# Patient Record
Sex: Female | Born: 1960 | Race: White | Hispanic: No | Smoking: Never smoker
Health system: Southern US, Community
[De-identification: ages and names within clinical notes are randomized; demographics above are authoritative.]

## PROBLEM LIST (undated history)

## (undated) DIAGNOSIS — E0590 Thyrotoxicosis, unspecified without thyrotoxic crisis or storm: Secondary | ICD-10-CM

## (undated) DIAGNOSIS — E063 Autoimmune thyroiditis: Secondary | ICD-10-CM

## (undated) DIAGNOSIS — E559 Vitamin D deficiency, unspecified: Secondary | ICD-10-CM

## (undated) DIAGNOSIS — R011 Cardiac murmur, unspecified: Secondary | ICD-10-CM

## (undated) DIAGNOSIS — R32 Unspecified urinary incontinence: Secondary | ICD-10-CM

## (undated) DIAGNOSIS — F32A Depression, unspecified: Secondary | ICD-10-CM

## (undated) DIAGNOSIS — F329 Major depressive disorder, single episode, unspecified: Secondary | ICD-10-CM

## (undated) DIAGNOSIS — G7100 Muscular dystrophy, unspecified: Secondary | ICD-10-CM

## (undated) DIAGNOSIS — G7102 Facioscapulohumeral muscular dystrophy: Secondary | ICD-10-CM

## (undated) DIAGNOSIS — E079 Disorder of thyroid, unspecified: Secondary | ICD-10-CM

## (undated) HISTORY — DX: Disorder of thyroid, unspecified: E07.9

## (undated) HISTORY — DX: Major depressive disorder, single episode, unspecified: F32.9

## (undated) HISTORY — PX: TOTAL KNEE ARTHROPLASTY: SHX125

## (undated) HISTORY — DX: Unspecified urinary incontinence: R32

## (undated) HISTORY — DX: Muscular dystrophy, unspecified: G71.00

## (undated) HISTORY — DX: Depression, unspecified: F32.A

## (undated) HISTORY — DX: Autoimmune thyroiditis: E06.3

## (undated) HISTORY — DX: Cardiac murmur, unspecified: R01.1

---

## 1994-02-17 HISTORY — PX: TUBAL LIGATION: SHX77

## 2006-04-23 ENCOUNTER — Ambulatory Visit: Payer: Self-pay | Admitting: Internal Medicine

## 2006-05-25 ENCOUNTER — Ambulatory Visit: Payer: Self-pay | Admitting: Internal Medicine

## 2007-09-03 ENCOUNTER — Encounter: Payer: Self-pay | Admitting: Family Medicine

## 2007-10-11 ENCOUNTER — Encounter: Payer: Self-pay | Admitting: Family Medicine

## 2007-10-11 ENCOUNTER — Encounter: Payer: Self-pay | Admitting: Obstetrics & Gynecology

## 2007-10-11 ENCOUNTER — Ambulatory Visit: Payer: Self-pay | Admitting: Obstetrics & Gynecology

## 2007-10-19 LAB — CONVERTED CEMR LAB: Pap Smear: NORMAL

## 2007-10-21 ENCOUNTER — Ambulatory Visit: Payer: Self-pay | Admitting: Obstetrics & Gynecology

## 2007-11-18 LAB — HM MAMMOGRAPHY: HM Mammogram: NORMAL

## 2008-01-07 ENCOUNTER — Ambulatory Visit: Payer: Self-pay | Admitting: Family Medicine

## 2008-01-07 DIAGNOSIS — G7109 Other specified muscular dystrophies: Secondary | ICD-10-CM | POA: Insufficient documentation

## 2008-01-07 DIAGNOSIS — E7800 Pure hypercholesterolemia, unspecified: Secondary | ICD-10-CM | POA: Insufficient documentation

## 2008-01-07 DIAGNOSIS — R011 Cardiac murmur, unspecified: Secondary | ICD-10-CM | POA: Insufficient documentation

## 2008-01-07 DIAGNOSIS — R946 Abnormal results of thyroid function studies: Secondary | ICD-10-CM | POA: Insufficient documentation

## 2008-03-08 ENCOUNTER — Ambulatory Visit: Payer: Self-pay | Admitting: Family Medicine

## 2008-03-09 LAB — CONVERTED CEMR LAB
ALT: 17 units/L (ref 0–35)
Albumin: 4 g/dL (ref 3.5–5.2)
BUN: 7 mg/dL (ref 6–23)
Calcium: 9.4 mg/dL (ref 8.4–10.5)
Cholesterol: 215 mg/dL (ref 0–200)
Creatinine, Ser: 0.4 mg/dL (ref 0.4–1.2)
Free T4: 0.6 ng/dL (ref 0.6–1.6)
GFR calc Af Amer: 221 mL/min
Glucose, Bld: 86 mg/dL (ref 70–99)
HDL: 42.4 mg/dL (ref >39.0)
T4, Total: 6.7 ug/dL (ref 5.0–12.5)
Total Protein: 7.3 g/dL (ref 6.0–8.3)
Triglycerides: 78 mg/dL (ref 0–149)

## 2008-10-02 ENCOUNTER — Telehealth: Payer: Self-pay | Admitting: Family Medicine

## 2008-10-02 ENCOUNTER — Encounter: Payer: Self-pay | Admitting: Family Medicine

## 2009-01-18 ENCOUNTER — Telehealth: Payer: Self-pay | Admitting: Family Medicine

## 2009-02-21 ENCOUNTER — Ambulatory Visit: Payer: Self-pay | Admitting: Family Medicine

## 2009-02-21 DIAGNOSIS — L919 Hypertrophic disorder of the skin, unspecified: Secondary | ICD-10-CM

## 2009-02-21 DIAGNOSIS — N39498 Other specified urinary incontinence: Secondary | ICD-10-CM | POA: Insufficient documentation

## 2009-02-21 DIAGNOSIS — L909 Atrophic disorder of skin, unspecified: Secondary | ICD-10-CM | POA: Insufficient documentation

## 2009-02-23 LAB — CONVERTED CEMR LAB
AST: 20 units/L (ref 0–37)
Albumin: 3.9 g/dL (ref 3.5–5.2)
Alkaline Phosphatase: 49 units/L (ref 39–117)
Bilirubin, Direct: 0 mg/dL (ref 0.0–0.3)
CO2: 28 meq/L (ref 19–32)
GFR calc non Af Amer: 181.7 mL/min (ref >60)
Glucose, Bld: 96 mg/dL (ref 70–99)
Potassium: 3.8 meq/L (ref 3.5–5.1)
Sodium: 138 meq/L (ref 135–145)
Total CHOL/HDL Ratio: 4
Total Protein: 7.5 g/dL (ref 6.0–8.3)

## 2009-03-26 ENCOUNTER — Ambulatory Visit: Payer: Self-pay | Admitting: Internal Medicine

## 2009-06-11 ENCOUNTER — Telehealth: Payer: Self-pay | Admitting: Family Medicine

## 2009-08-03 ENCOUNTER — Emergency Department: Payer: Self-pay | Admitting: Emergency Medicine

## 2009-10-03 ENCOUNTER — Ambulatory Visit: Payer: Self-pay | Admitting: Family Medicine

## 2009-10-03 ENCOUNTER — Telehealth: Payer: Self-pay | Admitting: Family Medicine

## 2009-10-03 DIAGNOSIS — J019 Acute sinusitis, unspecified: Secondary | ICD-10-CM | POA: Insufficient documentation

## 2010-01-29 ENCOUNTER — Telehealth: Payer: Self-pay | Admitting: Family Medicine

## 2010-02-01 ENCOUNTER — Ambulatory Visit: Payer: Self-pay | Admitting: Family Medicine

## 2010-02-01 DIAGNOSIS — R5381 Other malaise: Secondary | ICD-10-CM | POA: Insufficient documentation

## 2010-02-01 DIAGNOSIS — R5383 Other fatigue: Secondary | ICD-10-CM

## 2010-02-01 LAB — CONVERTED CEMR LAB
ALT: 17 units/L (ref 0–35)
BUN: 10 mg/dL (ref 6–23)
Basophils Relative: 0.5 % (ref 0.0–3.0)
Bilirubin, Direct: 0.1 mg/dL (ref 0.0–0.3)
Chloride: 106 meq/L (ref 96–112)
Cholesterol: 183 mg/dL (ref 0–200)
Creatinine, Ser: 0.3 mg/dL — ABNORMAL LOW (ref 0.4–1.2)
Eosinophils Absolute: 0.1 10*3/uL (ref 0.0–0.7)
Eosinophils Relative: 2.1 % (ref 0.0–5.0)
GFR calc non Af Amer: 284.83 mL/min (ref >60.00)
HCT: 34.1 % — ABNORMAL LOW (ref 36.0–46.0)
Hemoglobin: 11.5 g/dL — ABNORMAL LOW (ref 12.0–15.0)
LDL Cholesterol: 129 mg/dL — ABNORMAL HIGH (ref 0–99)
Lymphs Abs: 1.2 10*3/uL (ref 0.7–4.0)
MCHC: 33.7 g/dL (ref 30.0–36.0)
MCV: 92.6 fL (ref 78.0–100.0)
Monocytes Absolute: 0.4 10*3/uL (ref 0.1–1.0)
Neutro Abs: 2.7 10*3/uL (ref 1.4–7.7)
Neutrophils Relative %: 61.4 % (ref 43.0–77.0)
RBC: 3.68 M/uL — ABNORMAL LOW (ref 3.87–5.11)
Total Bilirubin: 0.7 mg/dL (ref 0.3–1.2)
Total CHOL/HDL Ratio: 4
Triglycerides: 63 mg/dL (ref 0.0–149.0)
VLDL: 12.6 mg/dL (ref 0.0–40.0)
WBC: 4.4 10*3/uL — ABNORMAL LOW (ref 4.5–10.5)

## 2010-03-05 ENCOUNTER — Ambulatory Visit: Admit: 2010-03-05 | Payer: Self-pay | Admitting: Family Medicine

## 2010-03-19 NOTE — Progress Notes (Signed)
Summary: Rx Venlafaxine  Phone Note Refill Request Call back at [phone] Message from:  [facility] on June 11, 2009 8:37 AM  Refills Requested: Medication #1:  EFFEXOR XR 75 MG XR24H-CAP 1 tab by mouth daily. Received faxed refill request please advise.   Method Requested: Telephone to Pharmacy Initial call taken by: Linde Gillis CMA Duncan Dull),  June 11, 2009 8:38 AM  Follow-up for Phone Call        ok to call in  Call in to the pharmacy of their choice Call in #30, 11 refills. OR if they prefer a 90 day supply, #90 with 3 refills is OK, too Prescription instructions above  Follow-up by: Hannah Beat MD,  June 11, 2009 1:01 PM  Additional Follow-up for Phone Call Additional follow up Details #1::        Rx called to pharmacy Additional Follow-up by: Linde Gillis CMA Duncan Dull),  June 11, 2009 1:06 PM    Prescriptions: EFFEXOR XR 75 MG XR24H-CAP (VENLAFAXINE HCL) 1 tab by mouth daily  #30 x 11   Entered by:   Linde Gillis CMA (AAMA)   Authorized by:   Hannah Beat MD   Signed by:   Linde Gillis CMA (AAMA) on 06/11/2009   Method used:   Historical   RxID:   [id number]  Rx called into pharmacy.  Linde Gillis CMA Duncan Dull)  June 11, 2009 1:06 PM   Appended Document: Rx Venlafaxine Script changed to #90 with 3 refills per pt's request.

## 2010-03-19 NOTE — Progress Notes (Signed)
Summary: ok to take advil  Phone Note Call from Patient   Caller: Patient Summary of Call: Pt states she was seen today.  She says she feels like she has a terrible fever and is asking if she can take advil along with the other meds she is taking for this illness, advised ok. Initial call taken by: Lowella Petties CMA,  October 03, 2009 4:00 PM  Follow-up for Phone Call        ok. Ruthe Mannan MD  October 04, 2009 7:43 AM

## 2010-03-19 NOTE — Assessment & Plan Note (Signed)
Summary: CPX/CLE   Vital Signs:  Patient profile:   50 year old female Height:      65 inches Weight:      202.6 pounds BMI:     33.84 Temp:     98.3 degrees F oral Pulse rate:   78 / minute Pulse rhythm:   regular BP sitting:   120 / 78  (left arm) Cuff size:   regular  Vitals Entered By: Benny Lennert CMA Duncan Dull) (February 21, 2009 11:21 AM)  History of Present Illness: Chief complaint cpx patient needs refill of effexor also patient doesnt want pap  Depression, well controlled on venlafaxine dose. .. Sleeping fairly well, no SI. Fairly good energy.  Stable muscular dystrophy, use wheel chair. Wears pad for intermittant incontinence if waits to long to get to restroom in last 2 years. No stress associated symptoms.  Urinates every 2 hours, sometimes feels not empting completely, good urine flow.  Having menses every three weeks, not heavy..wonders about menopause..sister went throungh in late 72s.     Problems Prior to Update: 1)  Nonspecific Abnorm Results Thyroid Funct Study  (ICD-794.5) 2)  Hypercholesterolemia  (ICD-272.0) 3)  Cardiac Murmur, Benign  (ICD-785.2) 4)  Depression  (ICD-311) 5)  Muscular Dystrophy  (ICD-359.1)  Current Medications (verified): 1)  Probiotic  Caps (Misc Intestinal Flora Regulat) .... Take 1 Capsule By Mouth Once A Day 2)  Effexor Xr 75 Mg Xr24h-Cap (Venlafaxine Hcl) .Marland Kitchen.. 1 Tab By Mouth Daily  Allergies (verified): No Known Drug Allergies  Past History:  Past medical, surgical, family and social histories (including risk factors) reviewed, and no changes noted (except as noted below).  Past Surgical History: Reviewed history from 01/07/2008 and no changes required. BTL 1996  Family History: Reviewed history from 01/07/2008 and no changes required. father: valve in heart replaced, high chol mother: muscular dystrophy, CVA, uterine cancer sister: muscular dystrophy, CHF brother: ?aneurysm  Social History: Reviewed history  from 01/07/2008 and no changes required. Occupation: homemaker, disabled Married 1 son age 56, healthy Never Smoked Alcohol use-no Drug use-no Regular exercise-yes, swimms in summer, stationary bike Diet: fruits, veggies, Weight Watcher's   Review of Systems General:  Complains of fatigue. CV:  Denies chest pain or discomfort. Resp:  Denies shortness of breath. GI:  Denies abdominal pain. GU:  Denies dysuria and hematuria. Derm:  Denies lesion(s).  Physical Exam  General:  Well-developed,well-nourished,in no acute distress; alert,appropriate and cooperative throughout examination in wheelchair Eyes:  No corneal or conjunctival inflammation noted. EOMI. Perrla. Funduscopic exam benign, without hemorrhages, exudates or papilledema. Vision grossly normal. Ears:  External ear exam shows no significant lesions or deformities.  Otoscopic examination reveals clear canals, tympanic membranes are intact bilaterally without bulging, retraction, inflammation or discharge. Hearing is grossly normal bilaterally. Nose:  External nasal examination shows no deformity or inflammation. Nasal mucosa are pink and moist without lesions or exudates. Mouth:  Oral mucosa and oropharynx without lesions or exudates.  Teeth in good repair. Neck:  no carotid bruit or thyromegaly no cervical or supraclavicular lymphadenopathy  Chest Wall:  No deformities, masses, or tenderness noted. Breasts:  No mass, nodules, thickening, tenderness, bulging, retraction, inflamation, nipple discharge or skin changes noted.    Lungs:  Normal respiratory effort, chest expands symmetrically. Lungs are clear to auscultation, no crackles or wheezes. Heart:  Normal rate and regular rhythm. S1 and S2 normal without gallop, murmur, click, rub or other extra sounds. Abdomen:  Bowel sounds positive,abdomen soft and non-tender without masses,  organomegaly or hernias noted. Msk:  diffuse weakness secondary to MD Pulses:  R and L posterior  tibial pulses are full and equal bilaterally  Extremities:  no edema Skin:  Skin tags in axillae and under breasts Wart on right lower abdomen.   Impression & Recommendations:  Problem # 1:  Preventive Health Care (ICD-V70.0) Reviewed preventive care protocols, scheduled due services, and updated immunizations. Encouraged exercise, weight loss, healthy eating habits.   Problem # 2:  DEPRESSION (ICD-311)  Her updated medication list for this problem includes:    Effexor Xr 75 Mg Xr24h-cap (Venlafaxine hcl) .Marland Kitchen... 1 tab by mouth daily  Orders: TLB-TSH (Thyroid Stimulating Hormone) (84443-TSH)  Problem # 3:  OVERFLOW INCONTINENCE (ICD-788.39) Given MD..recommend urodynamics to determine how bladder functioning...no c;early simply a bladder spasm urgency issue.  Orders: Urology Referral (Urology)  Problem # 4:  ACROCHORDON (ICD-701.9)  Orders: Cryotherapy/Destruction benign or premalignant lesion (1st lesion)  (17000) Cryotherapy/Destruction benign or premalignant lesion (2nd-14th lesions) (17003)  Complete Medication List: 1)  Probiotic Caps (Misc intestinal flora regulat) .... Take 1 capsule by mouth once a day 2)  Effexor Xr 75 Mg Xr24h-cap (Venlafaxine hcl) .Marland Kitchen.. 1 tab by mouth daily  Other Orders: TLB-Lipid Panel (80061-LIPID) TLB-BMP (Basic Metabolic Panel-BMET) (80048-METABOL) TLB-Hepatic/Liver Function Pnl (80076-HEPATIC)  Patient Instructions: 1)  Referral Appointment Information 2)  Day/Date: 3)  Time: 4)  Place/MD: 5)  Address: 6)  Phone/Fax: 7)  Patient given appointment information. Information/Orders faxed/mailed.  8)  Please schedule a follow-up appointment in 1 year.  9)  You need to lose weight. Consider a lower calorie diet and regular exercise as tolerated.  10)  Take calcium +vitamin D daily 600 mg/400IU  Prescriptions: EFFEXOR XR 75 MG XR24H-CAP (VENLAFAXINE HCL) 1 tab by mouth daily  #90 x 3   Entered and Authorized by:   Kerby Nora MD   Signed  by:   Kerby Nora MD on 02/21/2009   Method used:   Electronically to        [facility] (retail)       [address]       [place], [place]  [address]       Ph: [phone]       Fax: [phone]   RxID:   [id number]   Current Allergies (reviewed today): No known allergies   Flu Vaccine Next Due:  Refused Last PAP:  Normal (10/19/2007 2:37:02 PM) PAP Next Due:  2 yr Last Mammogram:  Normal Bilateral (11/18/2007 2:37:02 PM) Mammogram Next Due:  Refused

## 2010-03-19 NOTE — Assessment & Plan Note (Signed)
Summary: BOTH HEAD AND CHEST COLD, COUGH, POSSIBLE FLU, BODY ACHING/JRR   Vital Signs:  Patient profile:   50 year old female Height:      65 inches Temp:     99.6 degrees F oral Pulse rate:   68 / minute Pulse rhythm:   regular BP sitting:   130 / 76  (left arm) Cuff size:   regular  Vitals Entered By: Linde Gillis CMA Duncan Dull) (October 03, 2009 10:33 AM) CC: head/chest cold, body aches, ? flu Comments Patient did not weigh she was on a mobility scooter   History of Present Illness: 50 yo here for URI symptoms for over a week. Has a h/o Muscular dystrophy and concerned that because she is immobile, she could develop pneumonia.  Fever for past 3 days, unsure of how high. Had 800 mg of Ibuprofen an hour before she came to office and temp is 99.6. Body aches, chills, facial pressure, runny nose. Dry cough, can't stop coughing.  No wheezing, SOB or CP.  Current Medications (verified): 1)  Probiotic  Caps (Misc Intestinal Flora Regulat) .... Take 1 Capsule By Mouth Once A Day 2)  Effexor Xr 75 Mg Xr24h-Cap (Venlafaxine Hcl) .Marland Kitchen.. 1 Tab By Mouth Daily 3)  Augmentin 500-125 Mg Tabs (Amoxicillin-Pot Clavulanate) .Marland Kitchen.. 1 By Mouth 2 Times Daily X 10 Days 4)  Tessalon Perles 100 Mg  Caps (Benzonatate) .Marland Kitchen.. 1 Tab By Mouth Three Times A Day As Needed Cough  Allergies (verified): No Known Drug Allergies  Past History:  Past Surgical History: Last updated: 01/07/2008 BTL 1996  Family History: Last updated: 01/07/2008 father: valve in heart replaced, high chol mother: muscular dystrophy, CVA, uterine cancer sister: muscular dystrophy, CHF brother: ?aneurysm  Social History: Last updated: 01/07/2008 Occupation: homemaker, disabled Married 1 son age 48, healthy Never Smoked Alcohol use-no Drug use-no Regular exercise-yes, swimms in summer, stationary bike Diet: fruits, veggies, Weight Watcher's   Risk Factors: Exercise: yes (01/07/2008)  Risk Factors: Smoking Status: never  (01/07/2008)  Review of Systems      See HPI General:  Complains of chills and fever. ENT:  Complains of earache, nasal congestion, postnasal drainage, sinus pressure, and sore throat; denies ringing in ears. CV:  Denies chest pain or discomfort and difficulty breathing at night. Resp:  Complains of cough; denies shortness of breath, sputum productive, and wheezing.  Physical Exam  General:  Well-developed,well-nourished,in no acute distress; alert,appropriate and cooperative throughout examination in wheelchair Ears:  TM retracted bilaterally, clear fluid. Nose:  nasal dischargemucosal pallor.   Frontal sinuses TTP Mouth:  pharyngeal erythema.   Lungs:  Normal respiratory effort, chest expands symmetrically. Lungs are clear to auscultation, no crackles or wheezes. Heart:  Normal rate and regular rhythm. S1 and S2 normal without gallop, murmur, click, rub or other extra sounds. Psych:  Cognition and judgment appear intact. Alert and cooperative with normal attention span and concentration. No apparent delusions, illusions, hallucinations   Impression & Recommendations:  Problem # 1:  ACUTE SINUSITIS, UNSPECIFIED (ICD-461.9) Assessment New Given duration and progression of symptoms and her immobility, will treat for bacterial sinusitis with Augmentin. See pt instructions for details. Her updated medication list for this problem includes:    Augmentin 500-125 Mg Tabs (Amoxicillin-pot clavulanate) .Marland Kitchen... 1 by mouth 2 times daily x 10 days    Tessalon Perles 100 Mg Caps (Benzonatate) .Marland Kitchen... 1 tab by mouth three times a day as needed cough  Complete Medication List: 1)  Probiotic Caps (Misc intestinal flora regulat) .Marland KitchenMarland KitchenMarland Kitchen  Take 1 capsule by mouth once a day 2)  Effexor Xr 75 Mg Xr24h-cap (Venlafaxine hcl) .Marland Kitchen.. 1 tab by mouth daily 3)  Augmentin 500-125 Mg Tabs (Amoxicillin-pot clavulanate) .Marland Kitchen.. 1 by mouth 2 times daily x 10 days 4)  Tessalon Perles 100 Mg Caps (Benzonatate) .Marland Kitchen.. 1 tab by  mouth three times a day as needed cough  Patient Instructions: 1)  Take antibiotic as directed.  Drink lots of fluids.  Treat sympotmatically with Mucinex, nasal saline irrigation, and Tylenol/Ibuprofen. Also try claritin D or zyrtec D over the counter- two times a day as needed ( have to sign for them at pharmacy). You can use warm compresses.  Cough suppressant at night. Call if not improving as expected in 5-7 days.  Prescriptions: TESSALON PERLES 100 MG  CAPS (BENZONATATE) 1 tab by mouth three times a day as needed cough  #30 x 0   Entered and Authorized by:   Ruthe Mannan MD   Signed by:   Ruthe Mannan MD on 10/03/2009   Method used:   Electronically to        [facility] (retail)       [address] [place], [place]  [address]       Ph: [phone]       Fax: [phone]   RxID:   [id number] AUGMENTIN 500-125 MG TABS (AMOXICILLIN-POT CLAVULANATE) 1 by mouth 2 times daily x 10 days  #20 x 0   Entered and Authorized by:   Ruthe Mannan MD   Signed by:   Ruthe Mannan MD on 10/03/2009   Method used:   Electronically to        [facility] (retail)       [address] [place], [place]  [address]       Ph: [phone]       Fax: [phone]   RxID:   [id number]   Current Allergies (reviewed today): No known allergies

## 2010-03-21 NOTE — Progress Notes (Signed)
Summary: wants labs  Phone Note Call from Patient Call back at Home Phone [phone]   Caller: Patient Call For: Kerby Nora MD Summary of Call: Pt is asking to have lab work done.  She wants her thyroid checked, lipids, hormone levels checked. Initial call taken by: Lowella Petties CMA, AAMA,  January 29, 2010 12:50 PM  Follow-up for Phone Call        Fasting lipids, CMET Dx 272.0, TSH, cbc Dx 780.79 WE do not regularly check hormone levels unless pt has specific concern.. etc. let me know if she is.  Schedule CPX in 02/2010 Follow-up by: Kerby Nora MD,  January 29, 2010 1:48 PM  Additional Follow-up for Phone Call Additional follow up Details #1::        left message for patient to  return my call.Consuello Masse CMA      Additional Follow-up for Phone Call Additional follow up Details #2::    Patient wants labs done before beginning of year so after she has these done she will schedule the cpx.Consuello Masse CMA   Follow-up by: Benny Lennert CMA Duncan Dull),  January 30, 2010 9:41 AM

## 2010-04-20 ENCOUNTER — Encounter: Payer: Self-pay | Admitting: Family Medicine

## 2010-04-26 ENCOUNTER — Ambulatory Visit: Payer: Self-pay | Admitting: Internal Medicine

## 2010-05-04 ENCOUNTER — Emergency Department: Payer: Self-pay | Admitting: Emergency Medicine

## 2010-05-06 ENCOUNTER — Other Ambulatory Visit: Payer: Self-pay | Admitting: Family Medicine

## 2010-05-06 ENCOUNTER — Ambulatory Visit (INDEPENDENT_AMBULATORY_CARE_PROVIDER_SITE_OTHER): Payer: BC Managed Care – PPO | Admitting: Family Medicine

## 2010-05-06 ENCOUNTER — Encounter: Payer: Self-pay | Admitting: Family Medicine

## 2010-05-06 DIAGNOSIS — B9789 Other viral agents as the cause of diseases classified elsewhere: Secondary | ICD-10-CM

## 2010-05-06 DIAGNOSIS — K920 Hematemesis: Secondary | ICD-10-CM

## 2010-05-06 DIAGNOSIS — R112 Nausea with vomiting, unspecified: Secondary | ICD-10-CM

## 2010-05-06 LAB — CBC WITH DIFFERENTIAL/PLATELET
Basophils Absolute: 0 10*3/uL (ref 0.0–0.1)
Basophils Relative: 0.4 % (ref 0.0–3.0)
HCT: 40 % (ref 36.0–46.0)
Hemoglobin: 13.6 g/dL (ref 12.0–15.0)
Lymphs Abs: 1.1 10*3/uL (ref 0.7–4.0)
Monocytes Relative: 8.3 % (ref 3.0–12.0)
Neutro Abs: 6.2 10*3/uL (ref 1.4–7.7)
RDW: 14.4 % (ref 11.5–14.6)

## 2010-05-06 LAB — BASIC METABOLIC PANEL
CO2: 25 mEq/L (ref 19–32)
GFR: 210.89 mL/min (ref >60.00)
Glucose, Bld: 104 mg/dL — ABNORMAL HIGH (ref 70–99)
Potassium: 3.8 mEq/L (ref 3.5–5.1)
Sodium: 137 mEq/L (ref 135–145)

## 2010-05-08 ENCOUNTER — Encounter: Payer: Self-pay | Admitting: Family Medicine

## 2010-05-08 ENCOUNTER — Ambulatory Visit (INDEPENDENT_AMBULATORY_CARE_PROVIDER_SITE_OTHER): Payer: BC Managed Care – PPO | Admitting: Family Medicine

## 2010-05-08 VITALS — BP 126/82 | HR 108 | Temp 97.5°F | Ht 65.0 in

## 2010-05-08 DIAGNOSIS — K529 Noninfective gastroenteritis and colitis, unspecified: Secondary | ICD-10-CM

## 2010-05-08 DIAGNOSIS — K5289 Other specified noninfective gastroenteritis and colitis: Secondary | ICD-10-CM

## 2010-05-08 NOTE — Progress Notes (Signed)
Gastroenteritis: Patient complains of nonbilious vomiting 2 times per day for 2 days.  There is no report of blood in stool, constipation, fever, heartburn, hematemesis and melena. Patient's oral intake has been decreased for liquids and decreased for solids.  Patient's urine output has been decreased with 3 voids in 24 hours, the last void was several hours ago.  Other contacts with similar symptoms include none.  Patient denies recent travel history. Patient denies recent ingestion of possible contaminated food, toxic plants, inappropriate medications/poisons.   ROS above  GEN: WDWN, NAD, Non-toxic, A & O x 3, in wheelchair HEENT: Atraumatic, Normocephalic. Neck supple. No masses, No LAD. Ears and Nose: No external deformity. CV: RRR, No M/G/R. No JVD. No thrill. No extra heart sounds. PULM: CTA B, no wheezes, crackles, rhonchi. No retractions. No resp. distress. No accessory muscle use. ABD: S, NT, ND,  Hyperactive BS. No rebound tenderness. No HSM.  EXTR: No c/c/e NEURO Normal gait.  PSYCH: Normally interactive. Conversant. Not depressed or anxious appearing.  Calm demeanor.   A/P: gastroenteritis, improving, c/w supportive care

## 2010-05-08 NOTE — Progress Notes (Deleted)
  Subjective:    Patient ID: [name], female    DOB: [birth year], 50 y.o.   MRN: [id number]  HPI    Review of Systems     Objective:   Physical Exam        Assessment & Plan:

## 2010-05-08 NOTE — Patient Instructions (Signed)
NAUSEA AND VOMITTING  Usually caused by GI infection  -persistant nausea and vomiting can be from other disorders and should be evaluated.  1. Avoid solid foods and dairy   2. Prevent dehydration: Pedialyte, Gatorade, Ginger Ale, broth, water. Start with small sips and slowly increase.  3. When able, advance diet to bananas, rice, applesauce, grits, toast, oatmeal. Avoid dairy, spicy, or fried foods.   4. Persistant vomiting in infants more than 6 hours always need a medical evaluation.

## 2010-05-16 NOTE — Assessment & Plan Note (Signed)
Summary: acu visit per lori jrt   Vital Signs:  Patient profile:   50 year old female Height:      65 inches Temp:     97.8 degrees F oral Pulse rate:   74 / minute Pulse rhythm:   regular BP sitting:   110 / 80  (right arm) Cuff size:   regular  Vitals Entered By: Linde Gillis CMA Duncan Dull) (May 06, 2010 12:16 PM) CC: not feeling well   History of Present Illness: 50 year old with MD presents "not feeling well"  04/18/2010 - started out with some sinus difficulties and went to the UC in [place] and took some advil and mucinex, also took a decongestant. Then went to UC, then took some amox.  Also had some ear congestion.   Then a week later, went to ENT, ABX, nose spray and mucinex. Flonase, also started to throw up since sat.   Still feeling nauseous.  Not been able to drink or eat much.  Threw up in the parking lot.   Threw up some distincet reddish coloration. Was a little, then threw up a second time and it was more. No coffee ground emesis. Threw up three times in the last 24 hours.  urinated this morning at 11 AM and last PM at 9 PM.  Current Problems (verified): 1)  Other Malaise and Fatigue  (ICD-780.79) 2)  Acute Sinusitis, Unspecified  (ICD-461.9) 3)  Routine General Medical Exam@[facility]  (ICD-V70.0) 4)  Acrochordon  (ICD-701.9) 5)  Overflow Incontinence  (ICD-788.39) 6)  Nonspecific Abnorm Results Thyroid Funct Study  (ICD-794.5) 7)  Hypercholesterolemia  (ICD-272.0) 8)  Cardiac Murmur, Benign  (ICD-785.2) 9)  Depression  (ICD-311) 10)  Muscular Dystrophy  (ICD-359.1)  Allergies (verified): No Known Drug Allergies  Past History:  Past medical, surgical, family and social histories (including risk factors) reviewed, and no changes noted (except as noted below).  Past Surgical History: Reviewed history from 01/07/2008 and no changes required. BTL 1996  Family History: Reviewed history from 01/07/2008 and no changes required. father: valve in  heart replaced, high chol mother: muscular dystrophy, CVA, uterine cancer sister: muscular dystrophy, CHF brother: ?aneurysm  Social History: Reviewed history from 01/07/2008 and no changes required. 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 Psych:  tearful, has not taken antidepressants in 3 days.  Physical Exam  General:  Well-developed,well-nourished,in no acute distress; alert,appropriate and cooperative throughout examination in wheelchair Head:  normocephalic and atraumatic.   Ears:  serous TM B, no external deformities.   Mouth:  Oral mucosa and oropharynx without lesions or exudates.  Teeth in good repair. Neck:  No deformities, masses, or tenderness noted. Lungs:  Normal respiratory effort, chest expands symmetrically. Lungs are clear to auscultation, no crackles or wheezes. Heart:  Normal rate and regular rhythm. S1 and S2 normal without gallop, murmur, click, rub or other extra sounds. Abdomen:  soft, non-tender, normal bowel sounds, no distention, no masses, no guarding, and no rigidity.   Cervical Nodes:  No lymphadenopathy noted Psych:  tearful   Impression & Recommendations:  Problem # 1:  NAUSEA WITH VOMITING (ICD-787.01) Assessment New mild dehydration appears stable for outpatient management  if cbc comes back low, may need admission for ASAP EGD.  zofran as needed and  advance fluids  recheck in 2 days  Orders: Venipuncture (84696) TLB-BMP (Basic Metabolic Panel-BMET) (80048-METABOL)  Problem # 2:  HEMATEMESIS (ICD-578.0) Assessment: New  Orders: TLB-CBC Platelet - w/Differential (85025-CBCD)  Problem # 3:  VIRAL INFECTION-UNSPEC (ICD-079.99) viral infection vs sinusitis appears to be improving, d/c LVQ. I am not sure if  helping at this point. sinuses NT, no OM  The following medications were removed from the medication list:    Tessalon Perles 100 Mg Caps (Benzonatate) .Marland Kitchen... 1 tab by mouth three times a day as needed cough  Complete Medication List: 1)  Probiotic Caps (Misc intestinal flora regulat) .... Take 1 capsule by mouth once a day 2)  Effexor Xr 75 Mg Xr24h-cap (Venlafaxine hcl) .Marland Kitchen.. 1 tab by mouth daily 3)  Zofran Odt 4 Mg Tbdp (Ondansetron) .Marland Kitchen.. 1 by mouth q 8 hours as needed nausea  Patient Instructions: 1)  recheck on Wed. with Dr. Patsy Lager Prescriptions: ZOFRAN ODT 4 MG TBDP (ONDANSETRON) 1 by mouth q 8 hours as needed nausea  #30 x 0   Entered and Authorized by:   Hannah Beat MD   Signed by:   Hannah Beat MD on 05/06/2010   Method used:   [facility]        [facility]  [address] [phone][facility]       [address]       [place], [place]  [address]       Ph: [phone] or [phone]       Fax: [phone]   RxID:   [id number]    Orders Added: 1)  Venipuncture [16010] 2)  TLB-BMP (Basic Metabolic Panel-BMET) [80048-METABOL] 3)  TLB-CBC Platelet - w/Differential [85025-CBCD] 4)  Est. Patient Level IV [93235]    Current Allergies (reviewed today): No known allergies

## 2010-06-04 ENCOUNTER — Encounter: Payer: Self-pay | Admitting: Family Medicine

## 2010-06-04 ENCOUNTER — Other Ambulatory Visit (HOSPITAL_COMMUNITY)
Admission: RE | Admit: 2010-06-04 | Discharge: 2010-06-04 | Disposition: A | Discharge status: Home / Self Care | Payer: BC Managed Care – PPO | Source: Ambulatory Visit | Attending: Family Medicine | Admitting: Family Medicine

## 2010-06-04 ENCOUNTER — Ambulatory Visit (INDEPENDENT_AMBULATORY_CARE_PROVIDER_SITE_OTHER): Payer: BC Managed Care – PPO | Admitting: Family Medicine

## 2010-06-04 VITALS — BP 110/80 | HR 89 | Temp 98.6°F | Ht 65.0 in | Wt 188.4 lb

## 2010-06-04 DIAGNOSIS — Z01419 Encounter for gynecological examination (general) (routine) without abnormal findings: Secondary | ICD-10-CM

## 2010-06-04 DIAGNOSIS — Z1159 Encounter for screening for other viral diseases: Secondary | ICD-10-CM | POA: Insufficient documentation

## 2010-06-04 DIAGNOSIS — F3289 Other specified depressive episodes: Secondary | ICD-10-CM

## 2010-06-04 DIAGNOSIS — F329 Major depressive disorder, single episode, unspecified: Secondary | ICD-10-CM

## 2010-06-04 DIAGNOSIS — E7800 Pure hypercholesterolemia, unspecified: Secondary | ICD-10-CM

## 2010-06-04 DIAGNOSIS — G7109 Other specified muscular dystrophies: Secondary | ICD-10-CM

## 2010-06-04 MED ORDER — VENLAFAXINE HCL ER 75 MG PO CP24: 75.0000 mg | ORAL_CAPSULE | Freq: Every day | ORAL | Status: DC

## 2010-06-04 NOTE — Patient Instructions (Signed)
Call when you are ready for mammogram in fall. Continue great work on weight loss and diet changes.

## 2010-06-04 NOTE — Progress Notes (Signed)
Subjective:    Patient ID: [name], female    DOB: 1960/03/06, 50 y.o.   MRN: [id number]  HPI 50 year old female with history of muscular dystrophy presents for yearly CPX.  Depression, well controlled on venlafaxine dose. .. Sleeping fairly well, no SI.  Fairly good energy.   Stable muscular dystrophy, use wheel chair. Wears pad for intermittant incontinence if waits to long to get to restroom in last 2 years. No stress associated symptoms.   Urinates every 2 hours, sometimes feels not empting completely, good urine flow.  High cholesterol:Much improved with lifestyle changes.  Depression.Jamal Maes trial off for 10 days but found she needs it.. So restarted. Doing well back on this. Sleeping well at night. No SI.  Exercising 3 times a week. Also on weight watcher's ... Has lost 20 lbs.   Review of Systems  Constitutional: Positive for fatigue. Negative for fever.  HENT: Negative for congestion, rhinorrhea, sneezing, neck pain and postnasal drip.   Respiratory: Negative for chest tightness and shortness of breath.   Cardiovascular: Negative for chest pain, palpitations and leg swelling.  Gastrointestinal: Negative for nausea, abdominal pain, diarrhea, constipation and blood in stool.  Genitourinary: Negative for dysuria, urgency, flank pain, decreased urine volume and menstrual problem.       [Chronic urinary retention.. Never saw urologist.      Objective:   Physical Exam  Constitutional: Vital signs are normal. She appears well-developed and well-nourished. She is cooperative.  Non-toxic appearance. She does not appear ill. No distress.       Contractures and muscular dystrophy changes... Able to lift herself to table but uses scooter.  HENT:  Head: Normocephalic.  Right Ear: Hearing, tympanic membrane, external ear and ear canal normal.  Left Ear: Hearing, tympanic membrane, external ear and ear canal normal.  Nose: Nose normal.  Eyes: Conjunctivae, EOM and lids are  normal. Pupils are equal, round, and reactive to light. No foreign bodies found.  Neck: Trachea normal and normal range of motion. Neck supple. Carotid bruit is not present. No mass and no thyromegaly present.  Cardiovascular: Normal rate, regular rhythm, S1 normal, S2 normal, normal heart sounds and intact distal pulses.  Exam reveals no gallop.   No murmur heard. Pulmonary/Chest: Effort normal and breath sounds normal. No respiratory distress. She has no wheezes. She has no rhonchi. She has no rales.  Abdominal: Soft. Normal appearance and bowel sounds are normal. She exhibits no distension, no fluid wave, no abdominal bruit and no mass. There is no hepatosplenomegaly. There is no tenderness. There is no rebound, no guarding and no CVA tenderness. No hernia.  Genitourinary: Vagina normal and uterus normal. No breast swelling, tenderness, discharge or bleeding. Pelvic exam was performed with patient prone. There is no rash, tenderness or lesion on the right labia. There is no rash, tenderness or lesion on the left labia. Uterus is not enlarged and not tender. Cervix exhibits motion tenderness. Cervix exhibits no discharge and no friability. Right adnexum displays no mass, no tenderness and no fullness. Left adnexum displays no mass, no tenderness and no fullness.  Lymphadenopathy:    She has no cervical adenopathy.    She has no axillary adenopathy.  Neurological: She is alert. She has normal strength. No cranial nerve deficit or sensory deficit.  Skin: Skin is warm, dry and intact. No rash noted.  Psychiatric: She has a normal mood and affect. Her speech is normal and behavior is normal. Judgment normal. Her mood appears not  anxious. Cognition and memory are normal. She does not exhibit a depressed mood.          Assessment & Plan:  Complete Physical Exam:  The patient's preventative maintenance and recommended screening tests for an annual wellness exam were reviewed in full today. Brought  up to date unless services declined.  Counselled on the importance of diet, exercise, and its role in overall health and mortality. The patient's FH and SH was reviewed, including their home life, tobacco status, and drug and alcohol status.    High cholesterol, well controlled with lifestyle. Continue good work.  Muscular Dystrophy: Stable.  Depression, well controlled on effexor. Refilled.

## 2010-06-11 ENCOUNTER — Encounter: Payer: Self-pay | Admitting: *Deleted

## 2010-07-02 NOTE — Assessment & Plan Note (Signed)
Katrina, [name]               ACCOUNT NO.:  [id number]   MEDICAL RECORD NO.:  [id number]          PATIENT TYPE:  POB   LOCATION:  [facility]         FACILITY:  [facility] [place]   PHYSICIAN:  Johnella Moloney, MD        DATE OF BIRTH:  [birth year]   DATE OF SERVICE:  10/11/2007                                  CLINIC NOTE   CHIEF COMPLAINT:  Annual examination.   HISTORY OF PRESENT ILLNESS:  The patient is a 50 year old gravida 1,  para 1, last menstrual period on October 02, 2007, who is here for her  physical examination and to initiate gynecologic care.  The patient also  reports two issues.  1. Irregular menstrual bleeding.  The patient reports menarche at age      2.  Her period used to come every 4 weeks, and from 2 years ago,      it started coming every 3 weeks.  Then in May 2009, she noticed      that the cycle length got even shorter, and on examination of her      menstrual calendar, the patient was noted to have a menstrual      period from Jun 30, 2007, to Jul 07, 2007, and then July 27, 2007,      to August 01, 2007; August 12, 2007, to August 21, 2007; September 06, 2007, to      September 14, 2007; and October 02, 2007, to October 09, 2007.  The      patient is extremely anxious about this irregular bleeding.  She      also saying that her bleeding is very heavy using 5 super tampons a      day, as she notices that she passes a lot of clots.  She feels      extremely fatigued and also very weak.  The patient does deny any      lightheadedness, dizziness, or other presyncopal symptoms.  The      patient's mother has a history of endometrial cancer, and the      patient is very worried about this being a possibility.  2. Urinary incontinence.  Of note, the patient does have a history of      muscular dystrophy.  She has the facioscapulohumeral muscular      dystrophy and is followed by a specialist for this.  The patient      has noted increasing urinary incontinence over the past year.   She      describes feeling a lot of urge to go to the bathroom, but given      her muscular dystrophy and fatigue due to the irregular bleeding,      that she sometime is not able to make it to the bathroom and have      episode of incontinence requiring wearing pads daily.  The patient      wants to be evaluated for this and she went to her specialist and      asked if this had anything to do with her type of dystrophy, and      she was told that it was not  usual for the patients with this      muscular dystrophy problem to have urinary incontinence.   The patient does not have any other gynecologic concerns.  She is  currently sexually active with her husband and reports no problems.   PAST OBSTETRICAL HISTORY:  The patient had 1 cesarean section 19 years  ago, 1 living child.  Menstrual history as detailed in the HPI.  The  patient had a bilateral tubal ligation for contraception.  She has never  had an abnormal Pap smear and her last Pap smear was over 2 years ago.  Her last mammogram was 1-1/2 years ago and this was abnormal.  She  reports that they found a cyst but cannot remember the type of cyst that  was found.   PAST MEDICAL HISTORY:  1. Facioscapulohumeral muscular dystrophy.  2. Arthritis.  3. Hypercholesterinemia.   PAST SURGICAL HISTORY:  1. Cesarean section.  2. Bilateral tubal ligation.   MEDICATIONS:  Fluoxetine 40 mg 1 tablet p.o. at bedtime.   ALLERGIES:  No known drug allergies.  The patient is not allergic to  latex.   SOCIAL HISTORY:  The patient lives with her husband and her child.  She  is unemployed.  She does not smoke, drink alcohol, or use any illicit  drugs.  The patient denies any past or current history of physical,  sexual, or emotional abuse.   Familial history is notable for the [facility] muscular dystrophy in the  patient, her sister, and her mother.  Her mother also has a history of  endometrial cancer.  Sister has a history of blood clots, and  the father  has a history of heart disease.   REVIEW OF SYSTEMS:  The patient endorses numbness and weakness in  fingers, occasional night sweats, fatigue, and weight loss (The patient  is on weight watchers and trying to lose weight).  The patient also  mentions that she is coughing up phlegm.  Occasionally, she has loss of  urine with coughing and sneezing and has hot flashes some nights.   PHYSICAL EXAMINATION:  VITAL SIGNS:  Blood pressure 121/85, pulse 87,  respirations 20, weight 194 pounds, and height 5 feet and 5 inches.  GENERAL:  No apparent distress.  The patient is noted to ambulate with  the use of a walker.  HEAD, EARS, EYES, NOSE, AND THROAT:  Normocephalic and atraumatic.  NECK:  Supple.  No masses palpated.  THYROID:  Normal thyroid.  BREASTS:  Symmetric in size and no abnormal masses, discharge, skin  changes, or lymphadenopathy noted.  LUNGS:  Clear to auscultation bilaterally.  HEART:  Regular rate and rhythm.  ABDOMEN:  Soft, nontender, and nondistended.  Well-healed Pfannenstiel  incision from cesarean section.  PELVIC:  Normal external female genitalia.  Pink and well-rugated  vagina.  No lesions seen.  Normal discharge noted.  Normal cervical  contour.  Pap smear sample obtained.  On bimanual exam, the patient is  noted to have a small, mobile, and anteverted uterus normal contour.  Normal adnexa.  No tenderness on palpation.   ENDOMETRIAL BIOPSY:  The patient was told about the need for an  endometrial biopsy given her irregular bleeding.  The risks of the  biopsy including bleeding, infection, injury to surrounding organs, need  for additional procedures, possibility of not detecting abnormal tissue  were discussed with the patient and informed consent was obtained.  Of  note, the patient had a bilateral tubal ligation.  A urine pregnancy  was  not performed prior to this procedure.  After the Pap smear was  obtained, the cervix was swabbed with Betadine.   A tenaculum was placed  in the anterior lip of the cervix.  The 3-mm Pipelle was then introduced  into the uterine cavity and was noted to sound to 9 cm.  The suction was  then created, and the Pipelle was slowly rotated to obtain a moderate  amount of endometrial tissue.  This Pipelle was then removed from the  patient's uterus and tissue was sent to pathology for analysis.  The  tenaculum was removed.  Small amount of bleeding was noted from the  tenaculum site, which was controlled using pressure.  Overall, good  hemostasis was noted and all instruments were removed from the patient's  vagina.  The patient was told to expect some bleeding and cramping and  was given ibuprofen at the end of the procedure to help with her  cramping.   ASSESSMENT AND PLAN:  The patient is a 50 year old gravida 1, para 1  here for her annual exam.  The patient also reports 2 issues.  After her  physical exam, Pap smear was done.  We will follow up results.  Her  mammogram will be scheduled at the end of this visit.  The patient will  follow up with her primary care doctor for colon cancer screening as  indicated.  As for the patient's menometrorrhagia on review of her  menstrual calendar and apart from the menstrual period that happened  between August 12, 2007, and August 21, 2007, the rest of her cycles are  falling within 25 days of a cycle length.  She was told that this was  considered normal; however, given her report of menorrhagia, she did  need evaluation in the form of an endometrial biopsy.  We will follow up  results of this.  The patient will also undergo a pelvic ultrasound to  rule out any structural anomaly.  We will also check labs in form of  CBC, TSH, prolactin, FSH, and estradiol just to rule out other  etiologies of menorrhagia.  We will follow up with these results when  the patient comes back in 2 weeks.  As for her urinary incontinence, we  will check a urinalysis and urine culture to  rule out a urinary tract  infection.  The patient was told that there are cases reported in the  literature of urinary incontinence occurring in the patients who do have  muscular dystrophy.  The etiology of the urinary incontinence is thought  to be an upper motor neuron problem rather than a dystrophy of the  bladder muscles, but a lot of research has not been done in this area.  The patient is still at risk for developing stress incontinence as a  result of her pregnancy or urge incontinence given her symptoms.  She  will be referred to Urology for urodynamic testing, and based on the  results of this testing, that we will determine what kind of treatment  will be needed.  We will follow up the results of this testing and act  accordingly.  The patient was commended on her weight loss efforts and  told to continue her weight loss program.  She will follow up in 2 weeks  for discussion of results and was told to call the GYN Clinic or come to  the emergency room for any other GYN issues.  ______________________________  Johnella Moloney, MD     UD/MEDQ  D:  10/11/2007  T:  10/12/2007  Job:  [id number]

## 2010-11-14 ENCOUNTER — Telehealth: Payer: Self-pay

## 2010-11-14 DIAGNOSIS — R61 Generalized hyperhidrosis: Secondary | ICD-10-CM

## 2010-11-14 NOTE — Telephone Encounter (Signed)
Patient advised to call and schedule appt.

## 2010-11-14 NOTE — Telephone Encounter (Signed)
Pt  Goes between night sweats and being extremely cold, not sleeping well at night, very moody can be fine and then crying the next minute.Pts' last menstrual period was 10/13/10. Pt did not want to come in for appt. Pt prefers lab test to see if starting menopause.Pt uses [facility] if pharmacy needed and pt can be reached at [phone].

## 2010-11-14 NOTE — Telephone Encounter (Signed)
Appropriate.. Will check FSH, LH (first labs changing in menopause) and thyroid.

## 2010-11-15 ENCOUNTER — Other Ambulatory Visit: Payer: BC Managed Care – PPO

## 2010-11-18 ENCOUNTER — Other Ambulatory Visit: Payer: Self-pay | Admitting: Family Medicine

## 2010-11-18 DIAGNOSIS — R61 Generalized hyperhidrosis: Secondary | ICD-10-CM

## 2010-11-18 NOTE — Progress Notes (Signed)
Addended by: Alvina Chou on: 11/18/2010 11:00 AM   Modules accepted: Orders

## 2011-06-24 ENCOUNTER — Ambulatory Visit (INDEPENDENT_AMBULATORY_CARE_PROVIDER_SITE_OTHER): Payer: BC Managed Care – PPO | Admitting: Family Medicine

## 2011-06-24 ENCOUNTER — Encounter: Payer: Self-pay | Admitting: Family Medicine

## 2011-06-24 VITALS — BP 120/72 | HR 95 | Temp 98.5°F | Ht 65.0 in | Wt 195.8 lb

## 2011-06-24 DIAGNOSIS — R42 Dizziness and giddiness: Secondary | ICD-10-CM

## 2011-06-24 DIAGNOSIS — R5383 Other fatigue: Secondary | ICD-10-CM | POA: Insufficient documentation

## 2011-06-24 DIAGNOSIS — R5381 Other malaise: Secondary | ICD-10-CM

## 2011-06-24 DIAGNOSIS — E7800 Pure hypercholesterolemia, unspecified: Secondary | ICD-10-CM

## 2011-06-24 LAB — COMPREHENSIVE METABOLIC PANEL
ALT: 18 U/L (ref 0–35)
CO2: 26 mEq/L (ref 19–32)
Calcium: 8.8 mg/dL (ref 8.4–10.5)
Chloride: 104 mEq/L (ref 96–112)
Creatinine, Ser: 0.4 mg/dL (ref 0.4–1.2)
GFR: 209.9 mL/min (ref >60.00)
Glucose, Bld: 93 mg/dL (ref 70–99)
Total Protein: 7.2 g/dL (ref 6.0–8.3)

## 2011-06-24 LAB — CBC WITH DIFFERENTIAL/PLATELET
Basophils Relative: 0.3 % (ref 0.0–3.0)
Eosinophils Absolute: 0 10*3/uL (ref 0.0–0.7)
Hemoglobin: 12.1 g/dL (ref 12.0–15.0)
MCHC: 33.7 g/dL (ref 30.0–36.0)
MCV: 91 fl (ref 78.0–100.0)
Monocytes Absolute: 0.4 10*3/uL (ref 0.1–1.0)
Neutro Abs: 3.1 10*3/uL (ref 1.4–7.7)
RBC: 3.93 Mil/uL (ref 3.87–5.11)
RDW: 14.1 % (ref 11.5–14.6)

## 2011-06-24 LAB — TSH: TSH: 4.74 u[IU]/mL (ref 0.35–5.50)

## 2011-06-24 LAB — LIPID PANEL
Cholesterol: 223 mg/dL — ABNORMAL HIGH (ref 0–200)
HDL: 54.9 mg/dL (ref >39.00)

## 2011-06-24 NOTE — Assessment & Plan Note (Signed)
Will evaluate with labs. Given past tick bites and pt concern will check lyme titers.

## 2011-06-24 NOTE — Assessment & Plan Note (Signed)
Most consistent with vertigo from inner ear. Doubt related to missing one dose of medicaitons (she has missed venlafaxine before without any symptoms).  Resolved now but if recurs consider home eply maneuvers.  Given fatigue as well will evaluate with labs.

## 2011-06-24 NOTE — Progress Notes (Signed)
  Subjective:    Patient ID: [name], female    DOB: May 04, 1960, 51 y.o.   MRN: [id number]  HPI  51 year old female with MD reports vertigo in last week after skipping  black cohosh and venlafaxine. Had episode 1 week ago then again this last weekend.  Noted this when she turned her head. Lasted 2 days, constant but worse when moving head.  Has been under a lot of stress. No headache. No neuro changes, excessive fatigue. No new weakness. No vision changes. Eating regularly.  Has had multiple tick bites in past year. Family history of thyroid issues.  BP good today. .  No excessive bleeding, but menses has more clots. Mom went through menopause in 49-50.  Review of Systems  Constitutional: Positive for fatigue. Negative for fever, chills, appetite change and unexpected weight change.  HENT: Negative for ear pain.   Eyes: Negative for pain.  Respiratory: Negative for chest tightness and shortness of breath.   Cardiovascular: Negative for chest pain, palpitations and leg swelling.  Gastrointestinal: Negative for abdominal pain and abdominal distention.  Genitourinary: Negative for dysuria.  Neurological: Positive for dizziness and weakness. Negative for tremors, syncope, facial asymmetry and headaches.       Objective:   Physical Exam  Constitutional: Vital signs are normal. She appears well-developed and well-nourished. She is cooperative.  Non-toxic appearance. She does not appear ill. No distress.       Contractures and muscular dystrophy changes... Able to lift herself to table but uses scooter.  HENT:  Head: Normocephalic.  Right Ear: Hearing, tympanic membrane, external ear and ear canal normal.  Left Ear: Hearing, tympanic membrane, external ear and ear canal normal.  Nose: Nose normal.  Eyes: Conjunctivae, EOM and lids are normal. Pupils are equal, round, and reactive to light. No foreign bodies found.  Fundoscopic exam:      The right eye shows no  papilledema.       The left eye shows no papilledema.  Neck: Trachea normal and normal range of motion. Neck supple. Carotid bruit is not present. No mass and no thyromegaly present.  Cardiovascular: Normal rate, regular rhythm, S1 normal, S2 normal, normal heart sounds and intact distal pulses.  Exam reveals no gallop.   No murmur heard. Pulmonary/Chest: Effort normal and breath sounds normal. No respiratory distress. She has no wheezes. She has no rhonchi. She has no rales.  Abdominal: Soft. Normal appearance and bowel sounds are normal. She exhibits no distension, no fluid wave, no abdominal bruit and no mass. There is no hepatosplenomegaly. There is no tenderness. There is no rebound, no guarding and no CVA tenderness. No hernia.  Lymphadenopathy:    She has no cervical adenopathy.    She has no axillary adenopathy.  Neurological: She is alert. She displays atrophy and abnormal reflex. No cranial nerve deficit or sensory deficit. She exhibits abnormal muscle tone.  Skin: Skin is warm, dry and intact. No rash noted.  Psychiatric: She has a normal mood and affect. Her speech is normal and behavior is normal. Judgment normal. Her mood appears not anxious. Cognition and memory are normal. She does not exhibit a depressed mood.          Assessment & Plan:

## 2011-06-24 NOTE — Patient Instructions (Signed)
We will call with lab results.  Call to schedule CPX in next few months.

## 2011-09-12 ENCOUNTER — Other Ambulatory Visit: Payer: Self-pay | Admitting: Family Medicine

## 2011-09-26 ENCOUNTER — Encounter: Payer: BC Managed Care – PPO | Admitting: Family Medicine

## 2011-10-22 ENCOUNTER — Telehealth: Payer: Self-pay

## 2011-10-22 DIAGNOSIS — R5383 Other fatigue: Secondary | ICD-10-CM

## 2011-10-22 DIAGNOSIS — E7800 Pure hypercholesterolemia, unspecified: Secondary | ICD-10-CM

## 2011-10-22 NOTE — Telephone Encounter (Signed)
Pt scheduled CPX 10/31/11 and request lab appt prior to 10/31/11. Pt request thyroid labs and CPX labs.Please advise.

## 2011-10-23 NOTE — Telephone Encounter (Signed)
Lab orders entered

## 2011-10-31 ENCOUNTER — Ambulatory Visit (INDEPENDENT_AMBULATORY_CARE_PROVIDER_SITE_OTHER): Payer: BC Managed Care – PPO | Admitting: Family Medicine

## 2011-10-31 ENCOUNTER — Encounter: Payer: Self-pay | Admitting: Family Medicine

## 2011-10-31 VITALS — BP 132/76 | HR 84 | Temp 98.3°F | Ht 65.0 in | Wt 193.2 lb

## 2011-10-31 DIAGNOSIS — Z01419 Encounter for gynecological examination (general) (routine) without abnormal findings: Secondary | ICD-10-CM

## 2011-10-31 DIAGNOSIS — Z1231 Encounter for screening mammogram for malignant neoplasm of breast: Secondary | ICD-10-CM

## 2011-10-31 DIAGNOSIS — Z23 Encounter for immunization: Secondary | ICD-10-CM

## 2011-10-31 DIAGNOSIS — F3289 Other specified depressive episodes: Secondary | ICD-10-CM

## 2011-10-31 DIAGNOSIS — B070 Plantar wart: Secondary | ICD-10-CM

## 2011-10-31 DIAGNOSIS — F329 Major depressive disorder, single episode, unspecified: Secondary | ICD-10-CM

## 2011-10-31 DIAGNOSIS — Z0000 Encounter for general adult medical examination without abnormal findings: Secondary | ICD-10-CM

## 2011-10-31 DIAGNOSIS — E7800 Pure hypercholesterolemia, unspecified: Secondary | ICD-10-CM

## 2011-10-31 NOTE — Progress Notes (Signed)
51 year old female with history of muscular dystrophy presents for yearly CPX.   Constipation, new (nml goes every other day to daily): very poor stooling habits in last 2-3 weeks, once a week. Barely any urge to defecate, but feeling full and backed up. Using dulcolax. She takes a probiotic regularly.  Having irregular menses.Marland Kitchen likely perimenopaual. Having some hot flashes. Some difficulty sleeping at night. Fatigued when on menses.  Depression, well controlled on venlafaxine dose. .. no SI.   She is working out and this has helped her mood.   Stable muscular dystrophy, use wheel chair. Wears pad for intermittant incontinence if waits to long to get to restroom in last 2 years. No stress associated symptoms.  Urinates every 2 hours, sometimes feels not empting completely, good urine flow.   High cholesterol: due for re-eval. Lab Results  Component Value Date   CHOL 223* 06/24/2011   HDL 54.90 06/24/2011   LDLCALC 129* 02/01/2010   LDLDIRECT 167.8 06/24/2011   TRIG 78.0 06/24/2011   CHOLHDL 4 06/24/2011    Has noted something in second digit on right foot. Pain pressure on it, when shoe is on. No particular injury. No callus seen.  Review of Systems  Constitutional: Positive for fatigue. Negative for fever.  HENT: Negative for congestion, rhinorrhea, sneezing, neck pain and postnasal drip.  Respiratory: Negative for chest tightness and shortness of breath.  Cardiovascular: Negative for chest pain, palpitations and leg swelling.  Gastrointestinal: Negative for nausea, abdominal pain, diarrhea, constipation and blood in stool.  Genitourinary: Negative for dysuria, urgency, flank pain, decreased urine volume and menstrual problem.     Objective:   Physical Exam  Constitutional: Vital signs are normal. She appears well-developed and well-nourished. She is cooperative. Non-toxic appearance. She does not appear ill. No distress.  Contractures and muscular dystrophy changes... Able to lift  herself to table but uses scooter.  HENT:  Head: Normocephalic.  Right Ear: Hearing, tympanic membrane, external ear and ear canal normal.  Left Ear: Hearing, tympanic membrane, external ear and ear canal normal.  Nose: Nose normal.  Eyes: Conjunctivae, EOM and lids are normal. Pupils are equal, round, and reactive to light. No foreign bodies found.  Neck: Trachea normal and normal range of motion. Neck supple. Carotid bruit is not present. No mass and no thyromegaly present.  Cardiovascular: Normal rate, regular rhythm, S1 normal, S2 normal, normal heart sounds and intact distal pulses. Exam reveals no gallop.  No murmur heard.  Pulmonary/Chest: Effort normal and breath sounds normal. No respiratory distress. She has no wheezes. She has no rhonchi. She has no rales.  Abdominal: Soft. Normal appearance and bowel sounds are normal. She exhibits no distension, no fluid wave, no abdominal bruit and no mass. There is no hepatosplenomegaly. There is no tenderness. There is no rebound, no guarding and no CVA tenderness. No hernia.  Genitourinary: Vagina normal and uterus normal. No breast swelling, tenderness, discharge or bleeding. Pelvic exam was performed with patient prone. There is no rash, tenderness or lesion on the right labia. There is no rash, tenderness or lesion on the left labia. Uterus is not enlarged and not tender. Right adnexum displays no mass, no tenderness and no fullness. Left adnexum displays no mass, no tenderness and no fullness.  Lymphadenopathy:  She has no cervical adenopathy.  She has no axillary adenopathy.  Neurological: She is alert. She has normal strength. No cranial nerve deficit or sensory deficit.  Skin: Skin is warm, dry and intact. No rash  noted.  Psychiatric: She has a normal mood and affect. Her speech is normal and behavior is normal. Judgment normal. Her mood appears not anxious. Cognition and memory are normal. She does not exhibit a depressed mood.     Assessment & Plan:   Complete Physical Exam:  The patient's preventative maintenance and recommended screening tests for an annual wellness exam were reviewed in full today.  Brought up to date unless services declined.  Counselled on the importance of diet, exercise, and its role in overall health and mortality.  The patient's FH and SH was reviewed, including their home life, tobacco status, and drug and alcohol status.    Nonsmoker Vaccine:Due for tdap, not interested in flu vaccine.  Due for mammogram. Pap/DVE:  HAs had three nml pap in a row, last nml 2012. Will do pap every 2 years, DVE yearly. Colon: no family history.

## 2011-10-31 NOTE — Patient Instructions (Addendum)
Return for fasting labs early next week.  We will call wit results. If thyroid normal and constipation continuing.. Call for referral to GI. Stop at front to set up mammogram. Increase fluids and fiber in diet.

## 2011-10-31 NOTE — Progress Notes (Deleted)
  Subjective:    Patient ID: [name], female    DOB: 1960/05/08, 51 y.o.   MRN: [id number]  HPI The patient is here for annual wellness exam and preventative care.      Review of Systems     Objective:   Physical Exam        Assessment & Plan:

## 2011-11-04 ENCOUNTER — Other Ambulatory Visit (INDEPENDENT_AMBULATORY_CARE_PROVIDER_SITE_OTHER): Payer: BC Managed Care – PPO

## 2011-11-04 DIAGNOSIS — R5383 Other fatigue: Secondary | ICD-10-CM

## 2011-11-04 DIAGNOSIS — E7800 Pure hypercholesterolemia, unspecified: Secondary | ICD-10-CM

## 2011-11-04 DIAGNOSIS — R5381 Other malaise: Secondary | ICD-10-CM

## 2011-11-04 DIAGNOSIS — R61 Generalized hyperhidrosis: Secondary | ICD-10-CM

## 2011-11-04 LAB — CBC WITH DIFFERENTIAL/PLATELET
Basophils Absolute: 0 10*3/uL (ref 0.0–0.1)
Eosinophils Absolute: 0.1 10*3/uL (ref 0.0–0.7)
Lymphocytes Relative: 28.1 % (ref 12.0–46.0)
MCHC: 32.8 g/dL (ref 30.0–36.0)
MCV: 91.7 fl (ref 78.0–100.0)
Monocytes Absolute: 0.4 10*3/uL (ref 0.1–1.0)
Neutrophils Relative %: 61 % (ref 43.0–77.0)
Platelets: 303 10*3/uL (ref 150.0–400.0)
WBC: 5 10*3/uL (ref 4.5–10.5)

## 2011-11-04 LAB — VITAMIN B12: Vitamin B-12: 1434 pg/mL — ABNORMAL HIGH (ref 211–911)

## 2011-11-04 LAB — COMPREHENSIVE METABOLIC PANEL
AST: 20 U/L (ref 0–37)
BUN: 10 mg/dL (ref 6–23)
Calcium: 8.5 mg/dL (ref 8.4–10.5)
Chloride: 99 mEq/L (ref 96–112)
Creatinine, Ser: 0.4 mg/dL (ref 0.4–1.2)
GFR: 202.89 mL/min (ref >60.00)
Total Bilirubin: 0.6 mg/dL (ref 0.3–1.2)

## 2011-11-04 LAB — LUTEINIZING HORMONE: LH: 16.77 m[IU]/mL

## 2011-11-04 LAB — LIPID PANEL
HDL: 48.5 mg/dL (ref >39.00)
Triglycerides: 94 mg/dL (ref 0.0–149.0)
VLDL: 18.8 mg/dL (ref 0.0–40.0)

## 2011-11-04 LAB — LDL CHOLESTEROL, DIRECT: Direct LDL: 166.1 mg/dL

## 2011-11-04 LAB — FOLLICLE STIMULATING HORMONE: FSH: 7.9 m[IU]/mL

## 2011-11-04 LAB — TSH: TSH: 8.07 u[IU]/mL — ABNORMAL HIGH (ref 0.35–5.50)

## 2011-11-16 NOTE — Assessment & Plan Note (Signed)
Stable control on venlafaxine 

## 2011-11-16 NOTE — Assessment & Plan Note (Signed)
Due for re-eval. 

## 2011-11-16 NOTE — Assessment & Plan Note (Signed)
Callus, likely plantar wart noted by pt on right second toe at tip. She thought there was a foreign body (? Glass) in lesion and requested exploration.  Area cleaned with alcohol, no anesthesia used. 11 blade used to shave lesion and explore. No foreign body found but lesion appeared more consistent with plantar wart. Minimal bleeding. Bandaged with bandaid.  Pt instructed to use OTC salicylic acid  And duct tape on lesion to help wart resolve.

## 2012-05-28 ENCOUNTER — Observation Stay: Payer: Self-pay | Admitting: Internal Medicine

## 2012-05-29 LAB — CREATININE, SERUM
Creatinine: 0.35 mg/dL — ABNORMAL LOW (ref 0.60–1.30)
EGFR (African American): >60

## 2012-06-02 LAB — CREATININE, SERUM
Creatinine: 0.43 mg/dL — ABNORMAL LOW (ref 0.60–1.30)
EGFR (African American): >60

## 2013-02-18 ENCOUNTER — Ambulatory Visit: Payer: Self-pay | Admitting: Orthopedic Surgery

## 2014-01-19 ENCOUNTER — Ambulatory Visit: Payer: Self-pay | Admitting: Orthopedic Surgery

## 2014-01-25 ENCOUNTER — Ambulatory Visit: Payer: Self-pay | Admitting: Orthopedic Surgery

## 2014-01-25 LAB — CBC
HCT: 39.1 % (ref 35.0–47.0)
HGB: 13.1 g/dL (ref 12.0–16.0)
MCH: 31.5 pg (ref 26.0–34.0)
MCHC: 33.4 g/dL (ref 32.0–36.0)
MCV: 94 fL (ref 80–100)
Platelet: 347 10*3/uL (ref 150–440)
RBC: 4.15 10*6/uL (ref 3.80–5.20)
RDW: 13.7 % (ref 11.5–14.5)
WBC: 6.6 10*3/uL (ref 3.6–11.0)

## 2014-01-25 LAB — URINALYSIS, COMPLETE
BACTERIA: NONE SEEN
Bilirubin,UR: NEGATIVE
Blood: NEGATIVE
Glucose,UR: NEGATIVE mg/dL (ref 0–75)
LEUKOCYTE ESTERASE: NEGATIVE
NITRITE: NEGATIVE
PH: 5 (ref 4.5–8.0)
PROTEIN: NEGATIVE
Specific Gravity: 1.006 (ref 1.003–1.030)
WBC UR: 2 /HPF (ref 0–5)

## 2014-01-25 LAB — BASIC METABOLIC PANEL
Anion Gap: 7 (ref 7–16)
BUN: 6 mg/dL — AB (ref 7–18)
CALCIUM: 9.3 mg/dL (ref 8.5–10.1)
Chloride: 103 mmol/L (ref 98–107)
Co2: 28 mmol/L (ref 21–32)
Creatinine: 0.31 mg/dL — ABNORMAL LOW (ref 0.60–1.30)
EGFR (African American): >60
EGFR (Non-African Amer.): >60
GLUCOSE: 99 mg/dL (ref 65–99)
Osmolality: 273 (ref 275–301)
Potassium: 3.9 mmol/L (ref 3.5–5.1)
Sodium: 138 mmol/L (ref 136–145)

## 2014-01-25 LAB — MRSA PCR SCREENING

## 2014-01-25 LAB — APTT: ACTIVATED PTT: 26.8 s (ref 23.6–35.9)

## 2014-01-25 LAB — SEDIMENTATION RATE: ERYTHROCYTE SED RATE: 11 mm/h (ref 0–30)

## 2014-01-25 LAB — PROTIME-INR
INR: 1
Prothrombin Time: 13 secs (ref 11.5–14.7)

## 2014-02-16 ENCOUNTER — Inpatient Hospital Stay: Payer: Self-pay | Admitting: Orthopedic Surgery

## 2014-02-17 LAB — CBC WITH DIFFERENTIAL/PLATELET
BASOS ABS: 0 10*3/uL (ref 0.0–0.1)
Basophil %: 0.1 %
Eosinophil #: 0 10*3/uL (ref 0.0–0.7)
Eosinophil %: 0.4 %
HCT: 34.7 % — ABNORMAL LOW (ref 35.0–47.0)
HGB: 11.5 g/dL — AB (ref 12.0–16.0)
LYMPHS ABS: 1 10*3/uL (ref 1.0–3.6)
Lymphocyte %: 11.5 %
MCH: 31.5 pg (ref 26.0–34.0)
MCHC: 33.2 g/dL (ref 32.0–36.0)
MCV: 95 fL (ref 80–100)
MONOS PCT: 10.5 %
Monocyte #: 0.9 x10 3/mm (ref 0.2–0.9)
NEUTROS PCT: 77.5 %
Neutrophil #: 6.6 10*3/uL — ABNORMAL HIGH (ref 1.4–6.5)
Platelet: 311 10*3/uL (ref 150–440)
RBC: 3.66 10*6/uL — ABNORMAL LOW (ref 3.80–5.20)
RDW: 13.2 % (ref 11.5–14.5)
WBC: 8.6 10*3/uL (ref 3.6–11.0)

## 2014-02-17 LAB — BASIC METABOLIC PANEL
Anion Gap: 3 — ABNORMAL LOW (ref 7–16)
BUN: 8 mg/dL (ref 7–18)
CALCIUM: 8.1 mg/dL — AB (ref 8.5–10.1)
Chloride: 103 mmol/L (ref 98–107)
Co2: 30 mmol/L (ref 21–32)
Creatinine: 0.53 mg/dL — ABNORMAL LOW (ref 0.60–1.30)
EGFR (African American): >60
GLUCOSE: 128 mg/dL — AB (ref 65–99)
OSMOLALITY: 272 (ref 275–301)
Potassium: 4.2 mmol/L (ref 3.5–5.1)
Sodium: 136 mmol/L (ref 136–145)

## 2014-02-18 LAB — CBC WITH DIFFERENTIAL/PLATELET
BASOS PCT: 0.3 %
Basophil #: 0 10*3/uL (ref 0.0–0.1)
EOS ABS: 0.2 10*3/uL (ref 0.0–0.7)
EOS PCT: 3.1 %
HCT: 34.5 % — ABNORMAL LOW (ref 35.0–47.0)
HGB: 11.2 g/dL — AB (ref 12.0–16.0)
LYMPHS PCT: 12.4 %
Lymphocyte #: 0.9 10*3/uL — ABNORMAL LOW (ref 1.0–3.6)
MCH: 30.7 pg (ref 26.0–34.0)
MCHC: 32.5 g/dL (ref 32.0–36.0)
MCV: 94 fL (ref 80–100)
Monocyte #: 0.9 x10 3/mm (ref 0.2–0.9)
Monocyte %: 12.9 %
NEUTROS ABS: 5.1 10*3/uL (ref 1.4–6.5)
Neutrophil %: 71.3 %
Platelet: 298 10*3/uL (ref 150–440)
RBC: 3.66 10*6/uL — AB (ref 3.80–5.20)
RDW: 13.3 % (ref 11.5–14.5)
WBC: 7.2 10*3/uL (ref 3.6–11.0)

## 2014-02-19 LAB — CBC WITH DIFFERENTIAL/PLATELET
BASOS ABS: 0 10*3/uL (ref 0.0–0.1)
BASOS PCT: 0.3 %
EOS ABS: 0.2 10*3/uL (ref 0.0–0.7)
Eosinophil %: 3.7 %
HCT: 31 % — AB (ref 35.0–47.0)
HGB: 10.3 g/dL — AB (ref 12.0–16.0)
LYMPHS ABS: 1.2 10*3/uL (ref 1.0–3.6)
Lymphocyte %: 18.9 %
MCH: 31.2 pg (ref 26.0–34.0)
MCHC: 33.3 g/dL (ref 32.0–36.0)
MCV: 94 fL (ref 80–100)
MONOS PCT: 13.2 %
Monocyte #: 0.8 x10 3/mm (ref 0.2–0.9)
NEUTROS ABS: 3.9 10*3/uL (ref 1.4–6.5)
Neutrophil %: 63.9 %
Platelet: 251 10*3/uL (ref 150–440)
RBC: 3.3 10*6/uL — AB (ref 3.80–5.20)
RDW: 13.1 % (ref 11.5–14.5)
WBC: 6.2 10*3/uL (ref 3.6–11.0)

## 2014-02-21 LAB — CREATININE, SERUM
Creatinine: 0.41 mg/dL — ABNORMAL LOW (ref 0.60–1.30)
EGFR (Non-African Amer.): >60

## 2014-06-09 NOTE — Discharge Summary (Signed)
ADDENDUM  PATIENT NAME:  [name], [name] DATE OF BIRTH:  07-29-1960  DATE OF ADMISSION:  05/28/2012 DATE OF DISCHARGE:    PRIMARY CARE PHYSICIAN: Dr. Ermalene SearingBedsole.  Please refer to the discharge summary dictated by Dr. Cherlynn KaiserSainani. The patient was supposed to be discharged to a nursing home yesterday, but is still waiting for authorization from the insurance company. The patient just got authorization and has a bed available in a nursing home. The patient has no symptoms and has no complaints. Vital signs stable. She will be discharged to a nursing home today. I discussed the patient's discharge plan with the patient and with the case manager.   TIME SPENT: About 32 minutes.  ____________________________ Katrina PollackQing Kenyatta Keidel, MD qc:aw D: 06/02/2012 12:05:06 ET T: 06/02/2012 12:47:37 ET JOB#: [id number]  cc: Katrina PollackQing Kitana Gage, MD, <Dictator> Katrina PollackQING Stewart Pimenta MD ELECTRONICALLY SIGNED 06/03/2012 22:02

## 2014-06-09 NOTE — H&P (Signed)
PATIENT NAME:  [name], [name] MR#:  [id number] DATE OF BIRTH:  04/03/1960  DATE OF ADMISSION:  05/28/2012  PRIMARY CARE PHYSICIAN:  Dr. Ermalene SearingBedsole at [facility].   CHIEF COMPLAINT:  Ankle fracture.   HISTORY OF PRESENT ILLNESS:  This is a 54 year old female with history of muscular dystrophy who was walking with her walker when she tripped on the wheel of her walker.  Both her ankles turned in and she fell back unfortunately suffering a right avulsion fracture of her ankle.  According to the ER physician, orthopedics was consulted and they did not recommend surgery, but they did recommend physical therapy.  The patient says that she will likely not be able to return home and will need to go to SNF.   REVIEW OF SYSTEMS:  CONSTITUTIONAL:  No fever, fatigue, weakness, weight loss, weight gain. EYES:  No blurred, double vision, glaucoma or cataracts. EARS, NOSE, THROAT:  No ear pain, hearing loss, seasonal allergies, postnasal drip.  RESPIRATORY:  No cough, wheezing, hemoptysis, COPD, dyspnea.  CARDIOVASCULAR:  No chest pain, orthopnea, edema, syncope, palpitations.  GASTROINTESTINAL:  No nausea, vomiting, diarrhea, abdominal pain, melena, or ulcers.  GENITOURINARY:  No dysuria, hematuria. ENDOCRINE:  No polyuria or polydipsia.    HEMATOLOGY AND LYMPHATIC:  No anemia, easy bruising. SKIN:  No rash or lesions.  MUSCULOSKELETAL:  She has muscular dystrophy which limits her activity.  She walks with a walker.  NEUROLOGIC:  No history of CVA, TIA, seizures.  PSYCHIATRIC:  No history of anxiety.  She does have a history of depression.   PAST MEDICAL HISTORY:   1.  Depression.   2.  Muscular dystrophy.  PAST SURGICAL HISTORY: 1.  Tubal ligation.  2.  C-section.   MEDICATIONS: 1.  Vitamin D3 5000 international units daily.  2.  Vitamin B12 1 mL on Monday, Wednesday, Friday.  3.  Effexor 75 mg daily.  4.  Estrogen tablet daily.  5.  Ferrous sulfate 325 mg daily.  6.  Ibuprofen 600 mg q. 8 hours  as needed.  7.  Nature's thyroid 32.5 mg daily.  8.  Norco 5/325 1 to 2 tablets q. 4 hours as needed pain.  9.  Progesterone tablet daily.   ALLERGIES:  No known drug allergies.   FAMILY HISTORY:  Positive for muscular dystrophy, uterine cancer and valvular heart disease.   PHYSICAL EXAMINATION:  VITAL SIGNS:  The patient is afebrile, pulse 100, respirations 18, blood pressure 100/67, 99% on room air.  GENERAL:  The patient is alert and oriented, not in acute distress.  HEENT:  Head is atraumatic.  Pupils are round.  Sclerae anicteric.  Mucous membranes are moist.  Oropharynx is clear.  NECK:  Supple without JVD, carotid bruit, enlarged thyroid.  CARDIOVASCULAR:  Regular rate and rhythm.  No gallops, or rubs.  PMI is not displaced.  She has a 2 out of 6 systolic ejection murmur heard best at the right sternal border. LUNGS:  Clear to auscultation bilaterally without crackles, rales, rhonchi, or wheezing.  Normal to percussion.  ABDOMEN:  Bowel sounds are positive.  Nontender, nondistended.  No hepatosplenomegaly. EXTREMITIES:  No clubbing, cyanosis, or edema.  She does have both of her ankles wrapped in a splint.  NEUROLOGIC:  Cranial nerves II through XII are grossly intact.  SKIN:  Without rash or lesions.   IMAGING STUDIES:  Right ankle x-ray shows severe soft tissue swelling of the lateral malleolus, sliver of bone along anterior tibial joint with surrounding soft tissue  swelling concerning for an avulsion fracture.  There is no joint effusion.  Left ankle shows no acute fracture.   ASSESSMENT AND PLAN:  This is a 54 year old female with muscular dystrophy who suffered a mechanical fall and now has a right ankle avulsion fracture.  1.  Right ankle avulsion fracture.  She will be admitted.  Orthopedics has been consulted.  We will continue pain meds as well as physical therapy and case management.  More than likely the patient will need to go to rehab.   2.  Muscular dystrophy.  As  mentioned PT will be consulted.  3.  History of depression.  We will continue Effexor.  4.  Heart murmur.  This seems to be old.  5.  The patient is a DO NOT RESUSCITATE status.   TIME SPENT:  45 minutes.    ____________________________ Janyth Contes. Juliene Pina, MD spm:ea D: 05/28/2012 20:51:58 ET T: 05/29/2012 00:26:07 ET JOB#: [id number]  cc: Truxton Stupka P. Juliene Pina, MD, <Dictator> Dr. Ermalene Searing at [facility] P Adream Parzych MD ELECTRONICALLY SIGNED 06/03/2012 19:44

## 2014-06-09 NOTE — Discharge Summary (Signed)
PATIENT NAME:  [name], [name] MR#:  [id number] DATE OF BIRTH:  17-Jun-1960  DATE OF ADMISSION:  05/28/2012 DATE OF DISCHARGE:  06/01/2012   For detailed note, please take a look at the history and physical on admission done by Dr. Adrian SaranSital Mody.  DIAGNOSES AT DISCHARGE: Status post fall resulting in a right ankle avulsion fracture nonoperable, muscular dystrophy, hypothyroidism, depression.   DIET: The patient is being discharged on a regular diet.   ACTIVITY: As tolerated.   FOLLOWUP: With Dr. Kerby NoraAmy Bedsole in the next 1 to 2 weeks.   DISCHARGE MEDICATIONS: Ibuprofen 600 mg q.8 hours as needed; Effexor 75 mg daily; vitamin D3 with 5000 international units Monday, Wednesday, Thursday and Friday; vitamin B12 with 1000 mcg on Monday, Wednesday, Friday; estrogen tablet daily; progesterone daily; Nature Thyroid 32.5 mg 1 tab daily; iron sulfate 1 tab daily; Norco 5/325 mg 1 to 2 tabs q.4 hours as needed for pain.   CONSULTANTS DURING HOSPITAL COURSE: Dr. Juanell FairlyKevin Krasinski from orthopedics.   PERTINENT STUDIES DONE DURING THE HOSPITAL COURSE: X-ray of the left ankle showing no acute osseous injury of the left ankle. X-ray of the right ankle showing severe soft tissue swelling over the lateral malleolus, soft tissue swelling concerning for an avulsion fracture.   HOSPITAL COURSE: This is a 54 year old female with medical problems as mentioned above who presented to the hospital with a fall and inability to walk and noted to have significant bilateral ankle swelling and a right ankle avulsion fracture.  1.  Status post fall and a right-sided avulsion fracture: This was likely a mechanical fall. The patient had x-rays, which showed no acute abnormality on the left but did show a right-sided avulsion fracture. The patient was seen by orthopedics by Dr. Martha ClanKrasinski, who did not think that this was operable but recommended pain control and physical therapy. The patient was seen by physical therapy and they  recommended short-term rehab. She is therefore being discharged to [facility] for further care and getting rehab for the avulsion fracture as stated. The patient will continue some Motrin and Norco as needed for pain.  2.  Hypothyroidism: The patient was maintained on her Synthroid. She will resume that.  3.  Constipation: The patient received some lactulose and some Dulcolax suppository, which helped resolve this constipation.  4.  Muscular dystrophy: She will continue followup with her primary care physician as an outpatient.  5.  Depression: The patient was maintained on Effexor and she will resume that upon discharge too.   CODE STATUS: The patient is a full code.   TIME SPENT ON DISCHARGE: 35 minutes.   ____________________________ Rolly PancakeVivek Moore. Cherlynn KaiserSainani, MD vjs:jm D: 06/01/2012 14:09:33 ET T: 06/01/2012 15:09:31 ET JOB#: [id number]  cc: Rolly PancakeVivek Moore. Cherlynn KaiserSainani, MD, <Dictator> Excell SeltzerAmy E. Bedsole, MD Houston SirenVIVEK Moore [name] MD ELECTRONICALLY SIGNED 06/03/2012 21:38

## 2014-06-09 NOTE — Consult Note (Signed)
Brief Consult Note: Diagnosis: Bilateral lateral ankle sprains with avulsion fractures of the medial malleoli bilaterally.   Patient was seen by consultant.   Recommend further assessment or treatment.   Comments: I saw and examined the patient this AM around 10:30.  She was lying supine in bed and her husband was at the bedside.  Both legs were propped up on pillows and both ankles ACE wrapped.  Her pain was controlled. I explained to her about her injury.  She understands and was relieved to hear that the avulsion fractures will NOT require surgery.  I had recommended a PT evaluation yesterday.  The patient worked with Cassie and was able to stand.  She will continue to work with PT again today.  Her disposition will depend on her PT evaluation.  Continue rest, ice, and elevation.  Electronic Signatures: Juanell FairlyKrasinski, Mee Macdonnell (MD)  (Signed 13-Apr-14 19:05)  Authored: Brief Consult Note   Last Updated: 13-Apr-14 19:05 by Juanell FairlyKrasinski, Kedar Sedano (MD)

## 2014-06-18 NOTE — Discharge Summary (Signed)
PATIENT NAME:  [name], [name] MR#:  [id number] DATE OF BIRTH:  [birth year]  DATE OF ADMISSION:  02/16/2014 DATE OF DISCHARGE:  02/22/2014   REASON FOR ADMISSION: Status post right total knee arthroplasty.   HISTORY OF PRESENT ILLNESS: Ms. Joseph Art is a 54 year old female with FSH muscular dystrophy with persistent right knee pain, which was unresponsive to nonoperative management. She opted for a right total knee arthroplasty. The patient was admitted postoperative for pain control and physical therapy.   PAST MEDICAL HISTORY INCLUDES: Depression, muscular dystrophy, tubal ligation, and is now status post right total knee arthroplasty on 02/16/2014.   PAST SURGICAL HISTORY: Tubal ligation and right total knee replacement.   HOME MEDICATIONS: The patient is on Vitamin D3 2000 international units daily, Vitamin B12 1000 mcg daily, venlafaxine 100 mg 1 tablet daily at bedtime, oxycodone 5 mg 1-2 tablets every 4-6 hours p.r.n. for pain, Nature Thyroid 1 tablet daily.   ALLERGIES: LATEX.   HOSPITAL COURSE: The patient was admitted on 02/16/2014 after an uncomplicated total knee arthroplasty. She had significant pain postoperatively after the spinal block wore off. She was treated with a multimodal approach to pain management, including oxycodone, intravenous Dilaudid, intravenous NSAIDs and Neurontin. The patient was kept on 24 hours of postoperative antibiotics. She was started on Lovenox on postoperative day #1 for DVT prophylaxis. Her Autovac drain, which had been placed during surgery, was removed on postoperative day #1.  On postoperative day #2, the patient had her Foley catheter removed. Her pain was better controlled. She was able to get up on the side of the bed with physical therapy. She was tolerating CPM at that time. Daily labs were drawn during the first few days of her hospitalization. Her hemoglobin and hematocrit remained stable, as did her vital signs. Physical and occupational therapy saw  the patient beginning on postoperative day #1, and followed her throughout her hospitalization. She was also encouraged to continue with incentive spirometry throughout her hospitalization while awake. Her knee was kept in a knee immobilizer at night to maintain extension. The patient was on postoperative day #4 had passed a bowel movement. Her pain was controlled. She was making good progress with physical therapy and was able to stand and take steps. The patient was waiting for insurance approval for SNF placement. Finally, on 02/22/2014, the patient was prepared for discharge to rehab, after insurance had given approval.   DISCHARGE INSTRUCTIONS: The patient will remain partial weight-bearing on the right lower extremity and use her walker for assistance with ambulation. She will continue using her ankle braces when up with physical therapy. She used these at baseline due to some weakness of her ankles, due to the muscular dystrophy. She will continue in thigh-high TED hose on both legs. She will elevate her heels off the bed. She will continue using her incentive spirometry. She should continues to keep the leg elevated when not working with physical therapy. Her staples will be removed in the orthopedic office following discharge. She may continue using the Polar Care as well. The patient will follow up with me in the office in 7-10 days for a wound check and staple removal.   DISCHARGE MEDICATIONS: The patient will remain on her home medications, and will be discharged on oxycodone 5 mg taken 1-2 tablets every 4-6 hours p.r.n. for pain. She may take acetaminophen 325 mg 2 tablets every 4 hours as needed for pain or temperature greater than 100.4, and she will also continued to take magnesium hydroxide  oral suspension 30 mg b.i.d. as needed for constipation. She will remain on Lovenox 30 mg subcutaneous b.i.d. for DVT prophylaxis. She will take ferrous sulfate 325 mg p.o. daily, and will remain on Colace 1  tablet b.i.d. while taking narcotics.    ____________________________ Kathreen DevoidKevin L. Khadar Monger, MD klk:MT D: 02/22/2014 13:02:09 ET T: 02/22/2014 13:33:15 ET JOB#: [id number]  cc: Kathreen DevoidKevin L. Cyanna Neace, MD, <Dictator> Kathreen DevoidKEVIN L Gordie Belvin MD ELECTRONICALLY SIGNED 02/24/2014 21:16

## 2014-06-18 NOTE — Op Note (Signed)
PATIENT NAME:  [name], [name] MR#:  [id number] DATE OF BIRTH:  1960/09/23  DATE OF PROCEDURE:  02/16/2014  PREOPERATIVE DIAGNOSIS: Right knee osteoarthritis.  POSTOPERATIVE DIAGNOSIS: Right knee osteoarthritis.    PROCEDURE: Right total knee arthroplasty.   ANESTHESIA: Spinal.   SURGEON: Timoteo Gaul, MD  ASSISTANT: Earnestine Leys, MD   ESTIMATED BLOOD LOSS: 20 mL   COMPLICATIONS: None.   TOURNIQUET TIME: 103 minutes.   IMPLANTS: DePuy Sigma size 3 posterior stabilized cemented femoral component. A size 3 DePuy cemented rotating platform tibial component and a size 3, 10 mm, tibial polyethylene insert for a rotating platform component. A 38 mm patella component.   INDICATION FOR THE PROCEDURE: The patient is a 53 year old female with FSH muscular dystrophy, who has had persistent right knee pain for over a year, which has prevented her now from standing or ambulating, which she can normally do at baseline. She has failed all nonoperative management including corticosteroid injections, nonsteroidal anti-inflammatories, and hyaluronic acid injections. MRI had confirmed degenerative changes in the patellofemoral and medial compartments. The patient wished to proceed with surgery.   I reviewed the risks and benefits of surgery with her in the clinic prior to the date of surgery. Her husband was present for this office visit. She understands the risks include infection requiring removal of the prosthesis, bleeding requiring blood transfusion, nerve or blood vessel injury which may lead to permanent numbness or weakness, persistent pain, loss of knee motion or arthrofibrosis, persistent pain or instability, failure of the components or loosening of the components, and the need for further surgery including revision arthroplasty. The medical risks include, but are not limited to, DVT and pulmonary embolism, myocardial infarction, stroke, pneumonia, respiratory failure and death. The patient  also understands that her muscular dystrophy may play a role in her rehab and make her rehab more difficult. It is possible that she does not get back to standing and walking, but the hope is that her knee pain will improve following surgery.   DESCRIPTION OF PROCEDURE: The patient was met in the preoperative area. I marked the right knee according to the hospital's Right Site protocol. I verbally confirming with the patient that this was the correct site of surgery, as well as confirming with my office notes and the radiographic studies. The patient was then taken to the Operating Room, where she underwent a spinal anesthetic. She was placed supine on the operative table. A Foley catheter was placed. All bony prominences were adequately padded. She was then prepped and draped in a sterile fashion. A timeout was performed to verify the patient's name, date of birth, medical record number, correct site of surgery and correct procedure to be performed. It was also used to verify the patient had received antibiotics, and all appropriate instruments, implants and radiographic studies were available in the room. Once all in attendance were in agreement, the case began.   The patient had approximately 10-15 degrees of recurvatum and could flex to approximately 120 degrees. She had no ligamentous instability on examination under anesthesia. A midline incision was drawn out with a surgical marker. The patient then had her right leg exsanguinated with an Esmarch. The tourniquet was inflated to 275 mmHg. The midline incision was then made with a 10 blade. Full thickness skin flaps were developed. A medial arthrotomy was then created using a deep #10 blade. The patella was everted and the knee was flexed to approximately 90-100 degrees. The Hoffa fat pad was debrided along with  medial and lateral menisci and the cruciate ligaments. A drill was then used to penetrate into the distal femur into the femoral medullary canal.  This allowed for placement of the intramedullary distal femoral cutting block, which was pinned into position for a resection of 9 mm. An oscillating saw was used to make the distal femoral osteotomy. The attention was then turned to the proximal tibia.   The external tibial alignment guide was applied to the anterior tibia. It was set to be  perpendicular to the mechanical axis of the tibia. The proximal tibial cutting block was pinned into position to allow for removal of 8 mm off the lateral high side. The tower was then removed, leaving the cutting block in place. A drop-down tibial alignment guide was then used to ensure that the proposed cut was in perpendicular alignment with the mechanical axis of the tibia. A third cross pin was then placed. The proximal tibial osteotomy was made with the oscillating saw. The cross pin was removed, and the cutting block was also removed. A 10 mm spacer block was placed with the knee in full extension.  A long alignment rod was used to confirm that the patient had an appropriate alignment of the right lower extremity. The knee was then flexed again to approximately 110 degrees. A distal femoral sizing guide was then applied over the distal femur. This was a posterior referencing guide. The patient was measured to be a size 3. This sizing guide was pinned into position. The rotation of the proposed cut was checked using Whitesides line in the epicondylar axis of the distal femur. The size 3 distal femoral cutting block was then applied onto the distal femur and pinned into position. A stylus was used to confirm that no notching of the anterior femur would occur using the current block position. The anterior, posterior and chamfer cuts were then created. The size 3 cutting block was then removed. The box cutting guide was then pinned into position over the distal femur and the box cut for the posterior stabilized component was created. A rasp was used to make sure that the  sides of the box were adequately resected. The box cutting guide was then removed. The attention was turned back to the tibia.   A size 3 tibial baseplate trial was placed over the proximal tibia and found to have excellent coverage. This was pinned into position along with the drill tower. A reamer was placed through the drill tower to create the keel along with a winged punch. Once in position, the pins were removed. The size 3 femoral trial was malleted into position and a 10 mm polyethylene trial was placed. The knee was brought through a full range of motion and the ligaments were checked for stability. The patient had excellent range of motion in medial and lateral stability, all degrees of motion. The knee was left in full extension. The attention was then turned to patellar preparation.   The patella was measured with a caliper. It was 24 mm in thickness. The patellar cutting guide was then applied to the patella, and an oscillating saw was used to make the osteotomy, leaving approximately 12 to 13 mm of patellar thickness. A size 38 mm cutting guide for the pegs was applied to the undersurface of the patella and the 3 pegs were drilled. A trial patella was then placed. The patella was then inverted back into position, and the knee was taken through a range of motion. There was  excellent patellar tracking. All trial components were then removed. The knee was copiously irrigated with pulse lavage. The capsule was then injected with Exparel; this was for postoperative pain control.   The methylmethacrylate was mixed on the back table and applied to all exposed bony surfaces of the femur, tibia, and patella. The size 3 rotating tibial platform was then malleted into position. Excess methylmethacrylate was removed. A size 3 posterior stabilized femoral component was then again malleted into position and excess methylmethacrylate was removed. A 10 mm polyethylene trial was then applied. The knee was brought  into full extension. The patellar component was then placed and clamped into position, and the excess methylmethacrylate was removed. The methylmethacrylate was allowed to set. The knee was then brought through a full range of motion and still was stable with a 10 mm insert. Therefore, the actual 10 mm, size 3 rotating platform polyethylene component was then placed. The knee was again brought through a full range of motion and had excellent patellar tracking and stability.   The knee joint was copiously irrigated. Two limbs of the Autovac drain were brought out through the superolateral knee. The arthrotomy was closed with a #1 Ethibond placed in an interrupted fashion. Once the medial arthrotomy had been closed, the wound was copiously irrigated again with pulse lavage. The subcutaneous tissues were closed in 2 layers with 0 and 2-0 Vicryl. The skin was approximated with staples. A dry sterile and compressive dressing was then applied, along with a Polar Care sleeve, TENS unit pads and a knee immobilizer. The patient was transferred to a hospital bed and brought to the PACU in stable condition.   I was scrubbed and present for the entire case, and all sharp and instrument counts were correct at the conclusion of the case.   I spoke with the patient's husband and her parents in the postoperative consultation room to let them know that the case had gone without complication and that the patient was stable in the recovery room.    ____________________________ Timoteo Gaul, MD klk:MT D: 02/20/2014 10:19:00 ET T: 02/20/2014 11:07:48 ET JOB#: [id number]  cc: Timoteo Gaul, MD, <Dictator> Timoteo Gaul MD ELECTRONICALLY SIGNED 02/20/2014 15:53

## 2014-11-27 ENCOUNTER — Ambulatory Visit
Admission: RE | Admit: 2014-11-27 | Discharge: 2014-11-27 | Disposition: A | Discharge status: Home / Self Care | Payer: BLUE CROSS/BLUE SHIELD | Source: Ambulatory Visit | Attending: Orthopedic Surgery | Admitting: Orthopedic Surgery

## 2014-11-27 ENCOUNTER — Other Ambulatory Visit: Payer: Self-pay | Admitting: Orthopedic Surgery

## 2014-11-27 DIAGNOSIS — M25561 Pain in right knee: Secondary | ICD-10-CM | POA: Diagnosis not present

## 2015-04-05 ENCOUNTER — Ambulatory Visit (INDEPENDENT_AMBULATORY_CARE_PROVIDER_SITE_OTHER): Payer: BLUE CROSS/BLUE SHIELD | Admitting: Obstetrics and Gynecology

## 2015-04-05 ENCOUNTER — Encounter: Payer: Self-pay | Admitting: Obstetrics and Gynecology

## 2015-04-05 VITALS — BP 120/76 | HR 111 | Ht 65.0 in

## 2015-04-05 DIAGNOSIS — N3289 Other specified disorders of bladder: Secondary | ICD-10-CM

## 2015-04-05 DIAGNOSIS — R32 Unspecified urinary incontinence: Secondary | ICD-10-CM

## 2015-04-05 LAB — POCT URINALYSIS DIPSTICK
Bilirubin, UA: NEGATIVE
Glucose, UA: NEGATIVE
Leukocytes, UA: NEGATIVE
Nitrite: NEGATIVE
PH UA: 6
PROTEIN UA: NEGATIVE
RBC UA: NEGATIVE
UROBILINOGEN UA: 0.2

## 2015-04-05 MED ORDER — OXYBUTYNIN CHLORIDE 5 MG PO TABS: 5.0000 mg | ORAL_TABLET | Freq: Two times a day (BID) | ORAL | Status: DC

## 2015-04-05 NOTE — Patient Instructions (Signed)
1. Start oxybutynin 5 mg twice a day 2. Return in 6 weeks for follow-up 3. Continue with timed voiding every 2 hours

## 2015-04-05 NOTE — Progress Notes (Signed)
GYN ENCOUNTER NOTE  Subjective:       [name] is a 55 y.o. G65P1001 female is here for gynecologic evaluation of the following issues:  1. Bladder problems  The patient is a 55 year old married white female, para 1001, perimenopausal, having irregular cycles approximately every 3 weeks to every 2-1/2 months, who presents for evaluation of long history of bladder problems. GU history: Urinary frequency-every 2 hours during the day Nocturia-once or twice a night History of urge symptomatology, with frequent loss of urine if unable to get to the bathroom No history of stress incontinence No history of chronic UTIs GI history: Bowel movements every other day. No need for splinting with bowel movements   Gynecologic History Menarche age 6 Cycles-regular until last several years Interval-every 3 weeks to Q 10 weeks History of severe dysmenorrhea requiring medication therapy; no loss of work or school because of pain No history of abnormal Pap smears Patient's last menstrual period was 01/18/2015 (approximate). Contraception: tubal ligation Last Pap: Normal.   Obstetric History OB History  Gravida Para Term Preterm AB SAB TAB Ectopic Multiple Living  # Outcome Date GA Lbr Len/2nd Weight Sex Delivery Anes PTL Lv  1 Term 1990   9 lb 9.6 oz (4.355 kg) M CS-LTranv   Y      Past Medical History  Diagnosis Date  . Muscular dystrophy (HCC)   . Depression   . Urinary incontinence   . Thyroid disease   . Hashimoto's disease   . Heart murmur     Past Surgical History  Procedure Laterality Date  . Tubal ligation  1996    Bilateral  . Total knee arthroplasty Right     Current Outpatient Prescriptions on File Prior to Visit  Medication Sig Dispense Refill  . Cyanocobalamin (VITAMIN B 12 PO) Take by mouth daily.    Marland Kitchen PROBIOTIC CAPS Take by mouth daily.      Marland Kitchen venlafaxine XR (EFFEXOR-XR) 75 MG 24 hr capsule TAKE ONE CAPSULE BY MOUTH EVERY DAY 90  capsule 3   No current facility-administered medications on file prior to visit.    No Known Allergies  Social History   Social History  . Marital Status: Married    Spouse Name: N/A  . Number of Children: 1  . Years of Education: N/A   Occupational History  . homemaker     disabled    Social History Main Topics  . Smoking status: Never Smoker   . Smokeless tobacco: Not on file  . Alcohol Use: No  . Drug Use: No  . Sexual Activity: Not on file   Other Topics Concern  . Not on file   Social History Narrative   Regular exercise, swims in summer, stationary bike   Diet: fruits, veggies, weight watchers    Family History  Problem Relation Age of Onset  . Muscular dystrophy Mother   . Stroke Mother   . Cancer Mother     uterine   . Hyperlipidemia Father   . Muscular dystrophy Sister   . Heart failure Sister   . Aneurysm Brother     The following portions of the patient's history were reviewed and updated as appropriate: allergies, current medications, past family history, past medical history, past social history, past surgical history and problem list.  Review of Systems Review of Systems - General ROS: negative for - chills, fatigue, fever, hot flashes, malaise or  night sweats Hematological and Lymphatic ROS: negative for - bleeding problems or swollen lymph nodes Gastrointestinal ROS: negative for - abdominal pain, blood in stools, change in bowel habits and nausea/vomiting Musculoskeletal ROS: negative for - joint pain, muscle pain or muscular weakness Genito-Urinary ROS: negative for - change in menstrual cycle, dysmenorrhea, dyspareunia, dysuria, genital discharge, genital ulcers, hematuria, , irregular/heavy menses, nocturia or pelvic pain. POSITIVE-incontinence; urgency  Objective:   BP 120/76 mmHg  Pulse 111  Ht  (1.651 m)  Wt   LMP 01/18/2015 (Approximate) CONSTITUTIONAL: Well-developed, well-nourished female in no acute distress. Patient is in  need of a scooter for movement HENT:  Normocephalic, atraumatic.  NECK: Not examined SKIN: Skin is warm and dry. No rash noted. Not diaphoretic. No erythema. No pallor. NEUROLGIC: Alert and oriented to person, place, and time. PSYCHIATRIC: Normal mood and affect. Normal behavior. Normal judgment and thought content. CARDIOVASCULAR:Not Examined RESPIRATORY: Not Examined BREASTS: Not Examined ABDOMEN: Soft, non distended; Non tender.  No Organomegaly. PELVIC:  External Genitalia: Normal  BUS: Normal  Vagina: Normal; no prolapse  Cervix: Normal; no lesions  Uterus: Normal size, shape,consistency, mobile, midplane, nontender  Adnexa: Normal; nonpalpable, nontender  RV: Normal external exam; normal sphincter tone; no rectal masses  Bladder: Nontender MUSCULOSKELETAL: Normal range of motion. No tenderness.  No cyanosis, clubbing, or edema.  PROCEDURE: Postvoid residual Following emptying of bladder, patient was placed in the dorsal lithotomy position. The periurethral region was cleaned with Betadine. Red Robinson catheter was inserted under sterile conditions with production of approximately 15 mL of urine.     Assessment:   1. Urge incontinence; no history of medication trial for management of symptoms 2. No evidence of overflow incontinence 3. History of FSH muscular dystrophy (facial/scapular/Hip)   Plan:   1. PVR checked 2. Oxybutynin 5 mg twice a day 3. Return in 6 weeks for follow-up  Herold Harms, MD  Note: This dictation was prepared with Dragon dictation along with smaller phrase technology. Any transcriptional errors that result from this process are unintentional.

## 2015-04-07 LAB — URINE CULTURE

## 2015-04-09 ENCOUNTER — Telehealth: Payer: Self-pay

## 2015-04-09 MED ORDER — NITROFURANTOIN MONOHYD MACRO 100 MG PO CAPS: 100.0000 mg | ORAL_CAPSULE | Freq: Two times a day (BID) | ORAL | Status: DC

## 2015-04-09 NOTE — Telephone Encounter (Signed)
Pt aware per vm. Med erx.  

## 2015-04-09 NOTE — Telephone Encounter (Signed)
-----   Message from Herold Harms, MD sent at 04/08/2015  9:06 PM EST ----- Please notify - Abnormal Labs Call in Macrobid twice daily x 7 days

## 2015-05-16 ENCOUNTER — Telehealth: Payer: Self-pay | Admitting: Obstetrics and Gynecology

## 2015-05-16 ENCOUNTER — Ambulatory Visit: Payer: BLUE CROSS/BLUE SHIELD | Admitting: Obstetrics and Gynecology

## 2015-05-16 NOTE — Telephone Encounter (Signed)
Patient cancelled her upcoming appointment because she isnt feeling but she wanted to let Dr D know that she is doing well with the medication.

## 2015-06-06 ENCOUNTER — Ambulatory Visit (INDEPENDENT_AMBULATORY_CARE_PROVIDER_SITE_OTHER): Payer: BLUE CROSS/BLUE SHIELD | Admitting: Obstetrics and Gynecology

## 2015-06-06 ENCOUNTER — Encounter: Payer: Self-pay | Admitting: Obstetrics and Gynecology

## 2015-06-06 VITALS — BP 118/89 | HR 101 | Ht 65.0 in

## 2015-06-06 DIAGNOSIS — N3289 Other specified disorders of bladder: Secondary | ICD-10-CM | POA: Diagnosis not present

## 2015-06-06 DIAGNOSIS — Z87440 Personal history of urinary (tract) infections: Secondary | ICD-10-CM

## 2015-06-06 LAB — POCT URINALYSIS DIPSTICK
BILIRUBIN UA: NEGATIVE
Glucose, UA: NEGATIVE
KETONES UA: NEGATIVE
Leukocytes, UA: NEGATIVE
Nitrite, UA: NEGATIVE
PH UA: 6
PROTEIN UA: NEGATIVE
RBC UA: NEGATIVE
Urobilinogen, UA: 0.2

## 2015-06-06 NOTE — Patient Instructions (Addendum)
1. Continue with oxybutynin 5 mg twice a day 2. Patient is to contact us if she would like to get her physical in August 2017.

## 2015-06-06 NOTE — Progress Notes (Signed)
Chief complaint: 1. Unstable bladder 2. History of recent UTI  Patient presents for follow-up. She is using oxybutynin 5 mg twice a day. She has noted marked improvement in her unstable bladder symptoms with decreased urinary frequency, decreased urgency, and decreased nocturia. She also has completed a course of Macrobid for UTI identified last visit. Side effects of dry mouth are noted. However, she desires to continue with medication and is managing the dry mouth symptoms with chewing gum.  Questions regarding HRT were addressed. She is not desiring to go on trial of HRT at this time. On flashes are minimal. Night sweats are occasional. She does not complain of vaginal dryness.  ASSESSMENT: 1. Unstable bladder, improved with oxybutynin therapy  Plan: 1. Continue with oxybutynin 5 mg twice a day 2. Return to yearly for follow-up. 3. Patient is to contact us if she would like to proceed with gynecologic physicals here in the future.  A total of 15 minutes were spent face-to-face with the patient during this encounter and over half of that time dealt with counseling and coordination of care.  Herold HarmsMartin A Defrancesco, MD  Note: This dictation was prepared with Dragon dictation along with smaller phrase technology. Any transcriptional errors that result from this process are unintentional.

## 2015-06-07 LAB — URINE CULTURE

## 2015-06-18 ENCOUNTER — Other Ambulatory Visit: Payer: Self-pay

## 2015-06-18 MED ORDER — OXYBUTYNIN CHLORIDE 5 MG PO TABS: 5.0000 mg | ORAL_TABLET | Freq: Two times a day (BID) | ORAL | Status: DC

## 2015-07-11 ENCOUNTER — Other Ambulatory Visit: Payer: Self-pay | Admitting: Family Medicine

## 2015-07-11 DIAGNOSIS — Z1231 Encounter for screening mammogram for malignant neoplasm of breast: Secondary | ICD-10-CM

## 2015-07-27 ENCOUNTER — Ambulatory Visit
Admission: RE | Admit: 2015-07-27 | Discharge: 2015-07-27 | Disposition: A | Discharge status: Home / Self Care | Payer: BLUE CROSS/BLUE SHIELD | Source: Ambulatory Visit | Attending: Family Medicine | Admitting: Family Medicine

## 2015-07-27 DIAGNOSIS — Z1231 Encounter for screening mammogram for malignant neoplasm of breast: Secondary | ICD-10-CM | POA: Diagnosis not present

## 2015-10-29 ENCOUNTER — Ambulatory Visit
Admission: RE | Admit: 2015-10-29 | Payer: BLUE CROSS/BLUE SHIELD | Source: Ambulatory Visit | Admitting: Gastroenterology

## 2015-10-29 ENCOUNTER — Encounter: Admission: RE | Payer: Self-pay | Source: Ambulatory Visit

## 2015-10-29 SURGERY — COLONOSCOPY WITH PROPOFOL
Anesthesia: General

## 2016-01-18 ENCOUNTER — Ambulatory Visit
Admission: RE | Admit: 2016-01-18 | Payer: BLUE CROSS/BLUE SHIELD | Source: Ambulatory Visit | Admitting: Gastroenterology

## 2016-01-18 ENCOUNTER — Encounter: Admission: RE | Payer: Self-pay | Source: Ambulatory Visit

## 2016-01-18 HISTORY — DX: Thyrotoxicosis, unspecified without thyrotoxic crisis or storm: E05.90

## 2016-01-18 HISTORY — DX: Vitamin D deficiency, unspecified: E55.9

## 2016-01-18 HISTORY — DX: Facioscapulohumeral muscular dystrophy: G71.02

## 2016-01-18 SURGERY — COLONOSCOPY WITH PROPOFOL
Anesthesia: General

## 2016-04-15 ENCOUNTER — Encounter: Payer: Self-pay | Admitting: Emergency Medicine

## 2016-04-15 ENCOUNTER — Emergency Department
Admission: EM | Admit: 2016-04-15 | Discharge: 2016-04-16 | Disposition: A | Discharge status: Other Acute Inpatient Hospital | Payer: BLUE CROSS/BLUE SHIELD | Attending: Student in an Organized Health Care Education/Training Program | Admitting: Student in an Organized Health Care Education/Training Program

## 2016-04-15 DIAGNOSIS — E7800 Pure hypercholesterolemia, unspecified: Secondary | ICD-10-CM | POA: Diagnosis present

## 2016-04-15 DIAGNOSIS — F329 Major depressive disorder, single episode, unspecified: Secondary | ICD-10-CM | POA: Insufficient documentation

## 2016-04-15 DIAGNOSIS — G7109 Other specified muscular dystrophies: Secondary | ICD-10-CM | POA: Diagnosis present

## 2016-04-15 DIAGNOSIS — R45851 Suicidal ideations: Secondary | ICD-10-CM | POA: Diagnosis present

## 2016-04-15 DIAGNOSIS — F332 Major depressive disorder, recurrent severe without psychotic features: Secondary | ICD-10-CM | POA: Diagnosis not present

## 2016-04-15 DIAGNOSIS — Z79899 Other long term (current) drug therapy: Secondary | ICD-10-CM | POA: Diagnosis not present

## 2016-04-15 LAB — URINE DRUG SCREEN, QUALITATIVE (ARMC ONLY)
Amphetamines, Ur Screen: NOT DETECTED
BARBITURATES, UR SCREEN: NOT DETECTED
Benzodiazepine, Ur Scrn: NOT DETECTED
CANNABINOID 50 NG, UR ~~LOC~~: NOT DETECTED
Cocaine Metabolite,Ur ~~LOC~~: NOT DETECTED
MDMA (Ecstasy)Ur Screen: NOT DETECTED
Methadone Scn, Ur: NOT DETECTED
Opiate, Ur Screen: NOT DETECTED
Phencyclidine (PCP) Ur S: NOT DETECTED
TRICYCLIC, UR SCREEN: NOT DETECTED

## 2016-04-15 LAB — COMPREHENSIVE METABOLIC PANEL
ALBUMIN: 4.3 g/dL (ref 3.5–5.0)
ALT: 22 U/L (ref 14–54)
AST: 26 U/L (ref 15–41)
Alkaline Phosphatase: 62 U/L (ref 38–126)
Anion gap: 7 (ref 5–15)
BUN: 14 mg/dL (ref 6–20)
CO2: 27 mmol/L (ref 22–32)
CREATININE: 0.37 mg/dL — AB (ref 0.44–1.00)
Calcium: 9.2 mg/dL (ref 8.9–10.3)
Chloride: 104 mmol/L (ref 101–111)
GFR calc Af Amer: >60 mL/min (ref >60)
GFR calc non Af Amer: >60 mL/min (ref >60)
GLUCOSE: 101 mg/dL — AB (ref 65–99)
Potassium: 3.6 mmol/L (ref 3.5–5.1)
SODIUM: 138 mmol/L (ref 135–145)
Total Bilirubin: 0.4 mg/dL (ref 0.3–1.2)
Total Protein: 7.3 g/dL (ref 6.5–8.1)

## 2016-04-15 LAB — URINALYSIS, COMPLETE (UACMP) WITH MICROSCOPIC
Bacteria, UA: NONE SEEN
Bilirubin Urine: NEGATIVE
Glucose, UA: NEGATIVE mg/dL
Hgb urine dipstick: NEGATIVE
KETONES UR: NEGATIVE mg/dL
Leukocytes, UA: NEGATIVE
Nitrite: NEGATIVE
PH: 7 (ref 5.0–8.0)
Protein, ur: NEGATIVE mg/dL
Specific Gravity, Urine: 1.011 (ref 1.005–1.030)

## 2016-04-15 LAB — CBC WITH DIFFERENTIAL/PLATELET
Basophils Absolute: 0 10*3/uL (ref 0–0.1)
Basophils Relative: 0 %
EOS ABS: 0.1 10*3/uL (ref 0–0.7)
EOS PCT: 1 %
HCT: 39 % (ref 35.0–47.0)
Hemoglobin: 13.3 g/dL (ref 12.0–16.0)
LYMPHS ABS: 2.4 10*3/uL (ref 1.0–3.6)
Lymphocytes Relative: 34 %
MCH: 31.2 pg (ref 26.0–34.0)
MCHC: 34.1 g/dL (ref 32.0–36.0)
MCV: 91.5 fL (ref 80.0–100.0)
MONO ABS: 0.7 10*3/uL (ref 0.2–0.9)
MONOS PCT: 9 %
Neutro Abs: 4 10*3/uL (ref 1.4–6.5)
Neutrophils Relative %: 56 %
PLATELETS: 305 10*3/uL (ref 150–440)
RBC: 4.26 MIL/uL (ref 3.80–5.20)
RDW: 13.6 % (ref 11.5–14.5)
WBC: 7.2 10*3/uL (ref 3.6–11.0)

## 2016-04-15 LAB — SALICYLATE LEVEL

## 2016-04-15 LAB — ACETAMINOPHEN LEVEL

## 2016-04-15 LAB — ETHANOL: Alcohol, Ethyl (B): <5 mg/dL (ref <5)

## 2016-04-15 NOTE — BH Assessment (Addendum)
Tele Assessment Note   [name] is an 56 y.o. female. Presenting voluntarily to ED with c/o depression and suicidal ideation. Pt contacted law enforcement and was transported to the hospital unaccompanied. Pt reports multiple current stressors including separation, family conflict, and "total rejection"  (lack of family and natural supports). Pt reports suicidal thoughts with plan to OD on Advil x2 weeks. Pt denies intent. Pt denies h/o suicide attempt and inpatient admission. Pt denies h/o self-harm. Pt reports h/o depression and is compliant with medications. Pt denies hallucinations. Pt did not appear to be responding to internal stimuli or experiencing delusional thought content.  Pt denies drug/alcohol use.   Pt has h/o FSHD muscular dystrophy and utilizes a wheel chair and shower chair. Pt reports inability to lift arms over her head.   Diagnosis: MDD  Past Medical History:  Past Medical History:  Diagnosis Date  . Depression   . FSHD (facioscapulohumeral muscular dystrophy) (HCC)   . Hashimoto's disease   . Heart murmur   . Hyperthyroidism   . Muscular dystrophy (HCC)   . Thyroid disease   . Urinary incontinence   . Vitamin D deficiency     Past Surgical History:  Procedure Laterality Date  . CESAREAN SECTION    . TOTAL KNEE ARTHROPLASTY Right   . TUBAL LIGATION  1996   Bilateral    Family History:  Family History  Problem Relation Age of Onset  . Muscular dystrophy Mother   . Stroke Mother   . Cancer Mother     uterine   . Hyperlipidemia Father   . Muscular dystrophy Sister   . Heart failure Sister   . Aneurysm Brother   . Breast cancer Neg Hx   . Colon cancer Neg Hx   . Ovarian cancer Neg Hx     Social History:  reports that she has never smoked. She has never used smokeless tobacco. She reports that she drinks alcohol. She reports that she does not use drugs.  Additional Social History:     CIWA: CIWA-Ar BP: (!) 122/97 Pulse Rate: 85 COWS:     PATIENT STRENGTHS: (choose at least two) Average or above average intelligence General fund of knowledge  Allergies: No Known Allergies  Home Medications:  (Not in a hospital admission)  OB/GYN Status:  No LMP recorded. Patient is not currently having periods (Reason: Perimenopausal).  General Assessment Data [facility] of Assessment[facility] ED TTS Assessment: In system Is this a Tele or Face-to-Face Assessment?: Face-to-Face Marital status: Separated Maiden name: Alonge Is patient pregnant?: No Pregnancy Status: No Living Arrangements: Non-relatives/Friends Can pt return to current living arrangement?: Yes Admission Status: Involuntary Is patient capable of signing voluntary admission?: Yes Referral Source: Self/Family/Friend Insurance type: BCBS     Crisis Care Plan Living Arrangements: Non-relatives/Friends Name of Psychiatrist: None Name of Therapist: Sharma Covert  Education Status Is patient currently in school?: No Highest grade of school patient has completed: 12 + technical certification  Risk to self with the past 6 months Suicidal Ideation: Yes-Currently Present (x2 weeks) Has patient been a risk to self within the past 6 months prior to admission? : No Suicidal Intent: No Is patient at risk for suicide?: Yes Suicidal Plan?: Yes-Currently Present Has patient had any suicidal plan within the past 6 months prior to admission? 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 : No Thoughts of Harm to Others: No Current Homicidal Intent: No Current Homicidal Plan: No Access to Homicidal Means: No History of harm to others?: No Does patient have access to weapons?: No Criminal Charges Pending?: No Does patient have a court date: No Is patient on probation?: No  Psychosis Hallucinations: None noted Delusions: None noted  Mental Status Report Appearance/Hygiene: In scrubs Eye Contact: Good Motor Activity: Other (Comment) (limited) Speech: Logical/coherent Level of Consciousness: Alert Mood: Depressed Affect: Appropriate to circumstance Anxiety Level: Minimal Thought Processes: Coherent, Relevant Judgement: Unimpaired Orientation: Person, Place, Time, Situation Obsessive Compulsive Thoughts/Behaviors: None  Cognitive Functioning Concentration: Decreased Memory: Recent Intact, Remote Intact Appetite: Good (increased "im a stress eater") Weight Loss:  (unable to stand on scale due to muscular dystrophy) Weight Gain:  (unable to stand on scale due to muscular dystrophy) Sleep: Decreased Total Hours of Sleep: 5  ADLScreening [facility] Assessment Services) Patient's cognitive ability adequate to safely complete daily activities?: Yes Patient able to express need for assistance with ADLs?: Yes Independently performs ADLs?: No  Prior Inpatient Therapy Prior Inpatient Therapy: No  Prior Outpatient Therapy Prior Outpatient Therapy: No Does patient have an ACCT team?: No Does patient have Intensive In-House Services?  : No Does patient have [facility] services? : No Does patient have P4CC services?: No  ADL Screening (condition at  time of admission) Patient's cognitive ability adequate to safely complete daily activities?: Yes Is the patient deaf or have difficulty hearing?: No Does the patient have difficulty seeing, even when wearing glasses/contacts?: No Does the patient have difficulty concentrating, remembering, or making decisions?: Yes Patient able to express need for assistance with ADLs?: Yes Does the patient have difficulty dressing or bathing?: Yes Independently performs ADLs?: No Communication: Independent Dressing (OT): Needs assistance Is this a change from baseline?: Pre-admission baseline Grooming: Needs assistance Is this a change from baseline?: Pre-admission baseline Feeding: Independent Bathing: Needs assistance, Independent with device (comment) Is this a change from baseline?: Pre-admission baseline Toileting: Needs assistance Is this a change from baseline?: Pre-admission baseline In/Out Bed: Needs assistance Is this a change from baseline?: Pre-admission baseline Walks in Home: Independent with device (comment), Needs assistance Is this a change from baseline?: Pre-admission baseline Does the patient have difficulty walking or climbing stairs?: Yes Weakness of Legs: Both  Home Assistive Devices/Equipment Home Assistive Devices/Equipment: Shower chair with back, Wheelchair  Therapy Consults (therapy consults require a physician order) PT Evaluation Needed: No OT Evalulation Needed: No SLP Evaluation Needed: No Abuse/Neglect Assessment (Assessment to be complete while patient is alone) Physical Abuse: Denies Verbal Abuse: Yes, past (Comment) (Pt reports being bullied in school as a child) Sexual Abuse: Denies Exploitation of patient/patient's resources: Denies Self-Neglect: Denies Values / Beliefs Cultural Requests During Hospitalization: None Spiritual Requests During Hospitalization: None Consults Spiritual Care Consult Needed: No Social Work Consult Needed: No Dispensing opticianAdvance  Directives (For Healthcare) Does Patient Have a Medical Advance Directive?: No Would patient like information on creating a medical advance  directive?: No - Patient declined    Additional Information 1:1 In Past 12 Months?: No CIRT Risk: No Elopement Risk: No Does patient have medical clearance?: No     Disposition:  Disposition Initial Assessment Completed for this Encounter: Yes Disposition of Patient: Other dispositions Other disposition(s): Other (Comment) (pending psychiatric recommendation)  [name] 04/15/2016 11:53 PM

## 2016-04-15 NOTE — ED Provider Notes (Signed)
[facility] Emergency Department Provider Note    First MD Initiated Contact with Patient 04/15/16 2100     (approximate)  I have reviewed the triage vital signs and the nursing notes.   HISTORY  Chief Complaint Psychiatric Evaluation    HPI [name] is a 56 y.o. female history depression presents with suicidal ideation and feelings of ejection and hopelessness. Patient states that she has a plan to overdose on Advil. States that the symptoms have been worsening after separation divorce and feeling that she is being rejected by her friends. Does not feel that she has good support. Denies any previous history of overdose or suicide attempts. States that she's been having these suicidal ideations for the past 2 weeks. Denies any hallucinations.   Past Medical History:  Diagnosis Date  . Depression   . FSHD (facioscapulohumeral muscular dystrophy) (HCC)   . Hashimoto's disease   . Heart murmur   . Hyperthyroidism   . Muscular dystrophy (HCC)   . Thyroid disease   . Urinary incontinence   . Vitamin D deficiency    Family History  Problem Relation Age of Onset  . Muscular dystrophy Mother   . Stroke Mother   . Cancer Mother     uterine   . Hyperlipidemia Father   . Muscular dystrophy Sister   . Heart failure Sister   . Aneurysm Brother   . Breast cancer Neg Hx   . Colon cancer Neg Hx   . Ovarian cancer Neg Hx    Past Surgical History:  Procedure Laterality Date  . CESAREAN SECTION    . TOTAL KNEE ARTHROPLASTY Right   . TUBAL LIGATION  1996   Bilateral   Patient Active Problem List   Diagnosis Date Noted  . History of UTI 06/06/2015  . Absence of bladder continence 04/05/2015  . Unstable bladder 04/05/2015  . Plantar wart of right foot 11/16/2011  . OVERFLOW INCONTINENCE 02/21/2009  . HYPERCHOLESTEROLEMIA 01/07/2008  . DEPRESSION 01/07/2008  . MUSCULAR DYSTROPHY 01/07/2008  . CARDIAC MURMUR, BENIGN 01/07/2008       Prior to Admission medications   Medication Sig Start Date End Date Taking? Authorizing Provider  Cyanocobalamin (VITAMIN B 12 PO) Take by mouth daily.    Historical Provider, MD  NATURE-THROID 97.5 MG TABS Take 1 tablet by mouth daily. 01/01/15   Historical Provider, MD  oxybutynin (DITROPAN) 5 MG tablet Take 1 tablet (5 mg total) by mouth 2 (two) times daily. 06/18/15   Herold HarmsMartin A Defrancesco, MD  PROBIOTIC CAPS Take by mouth daily.      Historical Provider, MD  UNABLE TO FIND selium daily    Historical Provider, MD  venlafaxine XR (EFFEXOR-XR) 75 MG 24 hr capsule TAKE ONE CAPSULE BY MOUTH EVERY DAY 09/12/11   Amy Michelle NasutiE Bedsole, MD  vitamin E 400 UNIT capsule Take 400 Units by mouth daily.    Historical Provider, MD  Zinc 100 MG TABS Take by mouth.    Historical Provider, MD    Allergies Patient has no known allergies.    Social History Social History  Substance Use Topics  . Smoking status: Never Smoker  . Smokeless tobacco: Never Used  . Alcohol use Yes    Review of Systems Patient denies headaches, rhinorrhea, blurry vision, numbness, shortness of breath, chest pain, edema, cough, abdominal pain, nausea, vomiting, diarrhea, dysuria, fevers, rashes or hallucinations unless otherwise stated above in HPI. ____________________________________________   PHYSICAL EXAM:  VITAL SIGNS: Vitals:  04/15/16 2051 04/15/16 2130  BP:  (!) 122/97  Pulse: 85   Resp: 18   Temp: 98.1 F (36.7 C)     Constitutional: Alert and oriented. Well appearing and in no acute distress. Eyes: Conjunctivae are normal. PERRL. EOMI. Head: Atraumatic. Nose: No congestion/rhinnorhea. Mouth/Throat: Mucous membranes are moist.  Oropharynx non-erythematous. Neck: No stridor. Painless ROM. No cervical spine tenderness to palpation Hematological/Lymphatic/Immunilogical: No cervical lymphadenopathy. Cardiovascular: Normal rate, regular rhythm. Grossly normal heart sounds.  Good peripheral  circulation. Respiratory: Normal respiratory effort.  No retractions. Lungs CTAB. Gastrointestinal: Soft and nontender. No distention. No abdominal bruits. No CVA tenderness. Genitourinary:  Musculoskeletal: No lower extremity tenderness nor edema.  No joint effusions. Neurologic:  Normal speech and language. No gross focal neurologic deficits are appreciated. No gait instability. Skin:  Skin is warm, dry and intact. No rash noted. Psychiatric: tearful, anxious appearing Speech and behavior are normal.  ____________________________________________   LABS (all labs ordered are listed, but only abnormal results are displayed)  Results for orders placed or performed during the hospital encounter of 04/15/16 (from the past 24 hour(s))  Comprehensive metabolic panel     Status: Abnormal   Collection Time: 04/15/16  8:53 PM  Result Value Ref Range   Sodium 138 135 - 145 mmol/L   Potassium 3.6 3.5 - 5.1 mmol/L   Chloride 104 101 - 111 mmol/L   CO2 27 22 - 32 mmol/L   Glucose, Bld 101 (H) 65 - 99 mg/dL   BUN 14 6 - 20 mg/dL   Creatinine, Ser 1.61 (L) 0.44 - 1.00 mg/dL   Calcium 9.2 8.9 - 09.6 mg/dL   Total Protein 7.3 6.5 - 8.1 g/dL   Albumin 4.3 3.5 - 5.0 g/dL   AST 26 15 - 41 U/L   ALT 22 14 - 54 U/L   Alkaline Phosphatase 62 38 - 126 U/L   Total Bilirubin 0.4 0.3 - 1.2 mg/dL   GFR calc non Af Amer >60 >60 mL/min   GFR calc Af Amer >60 >60 mL/min   Anion gap 7 5 - 15  Ethanol     Status: None   Collection Time: 04/15/16  8:53 PM  Result Value Ref Range   Alcohol, Ethyl (B) <5 <5 mg/dL  CBC with Diff     Status: None   Collection Time: 04/15/16  8:53 PM  Result Value Ref Range   WBC 7.2 3.6 - 11.0 K/uL   RBC 4.26 3.80 - 5.20 MIL/uL   Hemoglobin 13.3 12.0 - 16.0 g/dL   HCT 04.5 40.9 - 81.1 %   MCV 91.5 80.0 - 100.0 fL   MCH 31.2 26.0 - 34.0 pg   MCHC 34.1 32.0 - 36.0 g/dL   RDW 91.4 78.2 - 95.6 %   Platelets 305 150 - 440 K/uL   Neutrophils Relative % 56 %   Neutro Abs 4.0  1.4 - 6.5 K/uL   Lymphocytes Relative 34 %   Lymphs Abs 2.4 1.0 - 3.6 K/uL   Monocytes Relative 9 %   Monocytes Absolute 0.7 0.2 - 0.9 K/uL   Eosinophils Relative 1 %   Eosinophils Absolute 0.1 0 - 0.7 K/uL   Basophils Relative 0 %   Basophils Absolute 0.0 0 - 0.1 K/uL  Acetaminophen level     Status: Abnormal   Collection Time: 04/15/16  8:53 PM  Result Value Ref Range   Acetaminophen (Tylenol), Serum <10 (L) 10 - 30 ug/mL  Salicylate level  Status: None   Collection Time: 04/15/16  8:53 PM  Result Value Ref Range   Salicylate Lvl <7.0 2.8 - 30.0 mg/dL  Urinalysis, Complete w Microscopic     Status: Abnormal   Collection Time: 04/15/16  9:12 PM  Result Value Ref Range   Color, Urine STRAW (A) YELLOW   APPearance CLEAR (A) CLEAR   Specific Gravity, Urine 1.011 1.005 - 1.030   pH 7.0 5.0 - 8.0   Glucose, UA NEGATIVE NEGATIVE mg/dL   Hgb urine dipstick NEGATIVE NEGATIVE   Bilirubin Urine NEGATIVE NEGATIVE   Ketones, ur NEGATIVE NEGATIVE mg/dL   Protein, ur NEGATIVE NEGATIVE mg/dL   Nitrite NEGATIVE NEGATIVE   Leukocytes, UA NEGATIVE NEGATIVE   RBC / HPF 0-5 0 - 5 RBC/hpf   WBC, UA 0-5 0 - 5 WBC/hpf   Bacteria, UA NONE SEEN NONE SEEN   Squamous Epithelial / LPF 0-5 (A) NONE SEEN   Mucous PRESENT    ____________________________________________  EKG____________________________________________  RADIOLOGY  I personally reviewed all radiographic images ordered to evaluate for the above acute complaints and reviewed radiology reports and findings.  These findings were personally discussed with the patient.  Please see medical record for radiology report.  ____________________________________________   PROCEDURES  Procedure(s) performed:  Procedures    Critical Care performed: no ____________________________________________   INITIAL IMPRESSION / ASSESSMENT AND PLAN / ED COURSE  Pertinent labs & imaging results that were available during my care of the patient  were reviewed by me and considered in my medical decision making (see chart for details).  DDX: Psychosis, delirium, medication effect, noncompliance, polysubstance abuse, Si, Hi, depression   Rubina Basinski is a 56 y.o. who presents to the ED with for evaluation of SI.  Patient has psych history of depression.  Laboratory testing was ordered to evaluation for underlying electrolyte derangement or signs of underlying organic pathology to explain today's presentation.  Based on history and physical and laboratory evaluation, it appears that the patient's presentation is 2/2 underlying psychiatric disorder and will require further evaluation and management by inpatient psychiatry.  Patient was  made an IVC due to SI with a plan..  Disposition pending psychiatric evaluation.       ____________________________________________   FINAL CLINICAL IMPRESSION(S) / ED DIAGNOSES  Final diagnoses:  Suicidal ideation      NEW MEDICATIONS STARTED DURING THIS VISIT:  New Prescriptions   No medications on file     Note:  This document was prepared using Dragon voice recognition software and may include unintentional dictation errors.    Willy Eddy, MD 04/15/16 2238

## 2016-04-16 ENCOUNTER — Inpatient Hospital Stay
Admission: RE | Admit: 2016-04-16 | Discharge: 2016-04-18 | DRG: 885 | Disposition: A | Discharge status: Home / Self Care | Payer: BLUE CROSS/BLUE SHIELD | Source: Ambulatory Visit | Attending: Psychiatry | Admitting: Psychiatry

## 2016-04-16 DIAGNOSIS — N39490 Overflow incontinence: Secondary | ICD-10-CM | POA: Diagnosis present

## 2016-04-16 DIAGNOSIS — Z96651 Presence of right artificial knee joint: Secondary | ICD-10-CM | POA: Diagnosis present

## 2016-04-16 DIAGNOSIS — G710 Muscular dystrophy: Secondary | ICD-10-CM | POA: Diagnosis present

## 2016-04-16 DIAGNOSIS — E039 Hypothyroidism, unspecified: Secondary | ICD-10-CM

## 2016-04-16 DIAGNOSIS — F329 Major depressive disorder, single episode, unspecified: Secondary | ICD-10-CM | POA: Diagnosis not present

## 2016-04-16 DIAGNOSIS — G4700 Insomnia, unspecified: Secondary | ICD-10-CM | POA: Diagnosis present

## 2016-04-16 DIAGNOSIS — E063 Autoimmune thyroiditis: Secondary | ICD-10-CM | POA: Diagnosis present

## 2016-04-16 DIAGNOSIS — Z993 Dependence on wheelchair: Secondary | ICD-10-CM | POA: Diagnosis not present

## 2016-04-16 DIAGNOSIS — F332 Major depressive disorder, recurrent severe without psychotic features: Secondary | ICD-10-CM | POA: Diagnosis present

## 2016-04-16 DIAGNOSIS — R45851 Suicidal ideations: Secondary | ICD-10-CM | POA: Diagnosis present

## 2016-04-16 DIAGNOSIS — Z79899 Other long term (current) drug therapy: Secondary | ICD-10-CM | POA: Diagnosis not present

## 2016-04-16 DIAGNOSIS — G7109 Other specified muscular dystrophies: Secondary | ICD-10-CM | POA: Diagnosis present

## 2016-04-16 DIAGNOSIS — E7800 Pure hypercholesterolemia, unspecified: Secondary | ICD-10-CM | POA: Diagnosis present

## 2016-04-16 DIAGNOSIS — N39498 Other specified urinary incontinence: Secondary | ICD-10-CM | POA: Diagnosis present

## 2016-04-16 MED ORDER — CYANOCOBALAMIN 500 MCG PO TABS
250.0000 ug | ORAL_TABLET | Freq: Every day | ORAL | Status: DC
  Administered 2016-04-16: 250 ug via ORAL
  Filled 2016-04-16: qty 1

## 2016-04-16 MED ORDER — VITAMIN E 180 MG (400 UNIT) PO CAPS: 400.0000 [IU] | ORAL_CAPSULE | Freq: Every day | ORAL | Status: DC

## 2016-04-16 MED ORDER — MAGNESIUM OXIDE 400 (241.3 MG) MG PO TABS: 400.0000 mg | ORAL_TABLET | Freq: Every day | ORAL | Status: DC

## 2016-04-16 MED ORDER — CYANOCOBALAMIN 250 MCG PO TABS: 250.0000 ug | ORAL_TABLET | Freq: Every day | ORAL | Status: DC

## 2016-04-16 MED ORDER — OXYBUTYNIN CHLORIDE 5 MG PO TABS
5.0000 mg | ORAL_TABLET | Freq: Two times a day (BID) | ORAL | Status: DC
  Administered 2016-04-16 – 2016-04-18 (×4): 5 mg via ORAL
  Filled 2016-04-16 (×5): qty 1

## 2016-04-16 MED ORDER — LEVOTHYROXINE SODIUM 112 MCG PO TABS
112.0000 ug | ORAL_TABLET | Freq: Every day | ORAL | Status: DC
  Administered 2016-04-16: 112 ug via ORAL
  Filled 2016-04-16: qty 1

## 2016-04-16 MED ORDER — VENLAFAXINE HCL ER 75 MG PO CP24
150.0000 mg | ORAL_CAPSULE | Freq: Every day | ORAL | Status: DC
  Administered 2016-04-17 – 2016-04-18 (×2): 150 mg via ORAL
  Filled 2016-04-16 (×2): qty 2

## 2016-04-16 MED ORDER — ZINC SULFATE 220 (50 ZN) MG PO CAPS: 220.0000 mg | ORAL_CAPSULE | Freq: Every morning | ORAL | Status: DC

## 2016-04-16 MED ORDER — MAGNESIUM OXIDE 400 (241.3 MG) MG PO TABS
ORAL_TABLET | Freq: Every day | ORAL | Status: DC
  Administered 2016-04-16: 1 mg via ORAL

## 2016-04-16 MED ORDER — ACETAMINOPHEN 325 MG PO TABS
650.0000 mg | ORAL_TABLET | Freq: Four times a day (QID) | ORAL | Status: DC | PRN
  Administered 2016-04-17: 650 mg via ORAL
  Filled 2016-04-16: qty 2

## 2016-04-16 MED ORDER — VENLAFAXINE HCL ER 75 MG PO CP24
150.0000 mg | ORAL_CAPSULE | Freq: Every day | ORAL | Status: DC
  Administered 2016-04-16: 150 mg via ORAL
  Filled 2016-04-16: qty 2

## 2016-04-16 MED ORDER — ALUM & MAG HYDROXIDE-SIMETH 200-200-20 MG/5ML PO SUSP: 30.0000 mL | ORAL | Status: DC | PRN

## 2016-04-16 MED ORDER — MAGNESIUM HYDROXIDE 400 MG/5ML PO SUSP: 30.0000 mL | Freq: Every day | ORAL | Status: DC | PRN

## 2016-04-16 MED ORDER — VITAMIN E 180 MG (400 UNIT) PO CAPS
400.0000 [IU] | ORAL_CAPSULE | Freq: Every day | ORAL | Status: DC
  Administered 2016-04-16: 400 [IU] via ORAL
  Filled 2016-04-16: qty 1

## 2016-04-16 MED ORDER — ZINC SULFATE 220 (50 ZN) MG PO CAPS
220.0000 mg | ORAL_CAPSULE | Freq: Every morning | ORAL | Status: DC
  Administered 2016-04-16: 220 mg via ORAL
  Filled 2016-04-16: qty 1

## 2016-04-16 MED ORDER — CO Q-10 100 MG PO CAPS: 1.0000 | ORAL_CAPSULE | Freq: Every day | ORAL | Status: DC

## 2016-04-16 MED ORDER — OXYBUTYNIN CHLORIDE 5 MG PO TABS
5.0000 mg | ORAL_TABLET | Freq: Two times a day (BID) | ORAL | Status: DC
  Administered 2016-04-16: 5 mg via ORAL
  Filled 2016-04-16 (×2): qty 1

## 2016-04-16 MED ORDER — LEVOTHYROXINE SODIUM 112 MCG PO TABS
112.0000 ug | ORAL_TABLET | Freq: Every day | ORAL | Status: DC
  Administered 2016-04-17 – 2016-04-18 (×2): 112 ug via ORAL
  Filled 2016-04-16 (×3): qty 1

## 2016-04-16 NOTE — ED Notes (Signed)
Dr. Clapacs at bedside assessing patient. 

## 2016-04-16 NOTE — Consult Note (Signed)
[facility] Consult   Reason for Consult:  Consult for 56 year old woman with a history of depression who comes to the emergency room with suicidal thoughts Referring Physician:  Jimmye Norman Patient Identification: [name] MRN:  [id number] Principal Diagnosis: Severe recurrent major depression without psychotic features [facility]) Diagnosis:   Patient Active Problem List   Diagnosis Date Noted  . Thyroid disease [E07.9] 04/16/2016  . Severe recurrent major depression without psychotic features ([place]) [F33.2] 04/16/2016  . Suicidal ideation [R45.851] 04/16/2016  . History of UTI [Z87.440] 06/06/2015  . Absence of bladder continence [R32] 04/05/2015  . Unstable bladder [N32.89] 04/05/2015  . Plantar wart of right foot [B07.0] 11/16/2011  . OVERFLOW INCONTINENCE [N39.498] 02/21/2009  . HYPERCHOLESTEROLEMIA [E78.00] 01/07/2008  . DEPRESSION [F32.9] 01/07/2008  . MUSCULAR DYSTROPHY [G71.0] 01/07/2008  . CARDIAC MURMUR, BENIGN [R01.1] 01/07/2008    Total Time spent with patient: 1 hour  Subjective:   [name] is a 56 y.o. female patient admitted with "I don't want to live no more".  HPI:  Patient interviewed. Chart reviewed. This is a 56 year old woman with a history of depression who comes to the emergency room saying she is currently suicidal. She has been depressed for a long time but mood is been getting worse for the last several weeks. She's not eating well and is probably overeating. She has not been sleeping well. Energy has been poor. Thoughts of been very negative and depressed. There've been a series of new stresses. She has been separated from her husband since August but recently she saw the husband with another woman which caused her to fire off a bunch of ugly text messages which have caused some social backlash. She has some financial problems related to a remodeling effort that has gone bad at home. The last straw was that she had contacted her son  who lives in [place] to ask if she could come and see him but he told her he did not want to see her after some of her recent behavior. She is having suicidal thoughts with a plan to overdose on over-the-counter pain medicine. She has been compliant with her outpatient psychiatric medicine. Denies that she has been drinking or using drugs regularly. Does have a therapist she sees.  Medical history: Patient has muscular dystrophy. She is weak especially in her lower body and needs a wheelchair to get around. Also has a history of hypothyroidism.  Social history: Lives in the family home with a couple of "roommates". As mentioned she and her husband are separated and now her son is angry at her.  Substance abuse history: Says she drinks beer only "once in a while" and denies any history of alcohol or drug abuse.  Past Psychiatric History: No previous psychiatric hospitalization. Has been seeing a Marketing executive regularly and saw her most recently yesterday. She has medication for depression prescribed by her regular doctor. Takes Effexor and has been doing that for a long time. Unclear benefit. She remembers having taken fluoxetine in the past but says it made her agitated. Can't remember any other medicines. Denies any history of suicide attempts in the past  Risk to Self: Suicidal Ideation: Yes-Currently Present (x2 weeks) Suicidal Intent: No Is patient at risk for suicide?: Yes Suicidal Plan?: Yes-Currently Present Specify Current Suicidal Plan: Advil OD Access to Means: Yes Specify Access to Suicidal Means: Access to medication What has been your use of drugs/alcohol within the last 12 months?: Pt denies drug/alcohol use Intentional Self Injurious  Behavior: None Risk to Others: Homicidal Ideation: No Thoughts of Harm to Others: No Current Homicidal Intent: No Current Homicidal Plan: No Access to Homicidal Means: No History of harm to others?: No Does patient have access to  weapons?: No Criminal Charges Pending?: No Does patient have a court date: No Prior Inpatient Therapy: Prior Inpatient Therapy: No Prior Outpatient Therapy: Prior Outpatient Therapy: No Does patient have an ACCT team?: No Does patient have Intensive In-House Services?  : No Does patient have [facility] services? : No Does patient have P4[facility] services?: No  Past Medical History:  Past Medical History:  Diagnosis Date  . Depression   . 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 Hashimoto's disease   . Heart murmur   . Hyperthyroidism   . Muscular dystrophy ([place])   . Thyroid disease   . Urinary incontinence   . Vitamin D deficiency     Past Surgical History:  Procedure Laterality Date  . CESAREAN SECTION    . TOTAL KNEE ARTHROPLASTY Right   . TUBAL LIGATION  1996   Bilateral   Family History:  Family History  Problem Relation Age of Onset  . Muscular dystrophy Mother   . Stroke Mother   . Cancer Mother     uterine   . Hyperlipidemia Father   . Muscular dystrophy Sister   . Heart failure Sister   . Aneurysm Brother   . Breast cancer Neg Hx   . Colon cancer Neg Hx   . Ovarian cancer Neg Hx    Family Psychiatric  History: No significant family history is known Social History:  History  Alcohol Use  . Yes     History  Drug Use No    Social History   Social History  . Marital status: Legally Separated    Spouse name: N/A  . Number of children: 1  . Years of education: N/A   Occupational History  . homemaker Unemployed    disabled    Social History Main Topics  . Smoking status: Never Smoker  . Smokeless tobacco: Never Used  . Alcohol use Yes  . Drug use: No  . Sexual activity: Not Currently    Birth control/ protection: Surgical   Other Topics Concern  . None   Social History Narrative   Regular exercise, swims in summer, stationary bike   Diet: fruits, veggies, weight watchers   Additional Social History:    Allergies:  No Known  Allergies  Labs:  Results for orders placed or performed during the hospital encounter of 04/15/16 (from the past 48 hour(s))  Comprehensive metabolic panel     Status: Abnormal   Collection Time: 04/15/16  8:53 PM  Result Value Ref Range   Sodium 138 135 - 145 mmol/L   Potassium 3.6 3.5 - 5.1 mmol/L   Chloride 104 101 - 111 mmol/L   CO2 27 22 - 32 mmol/L   Glucose, Bld 101 (H) 65 - 99 mg/dL   BUN 14 6 - 20 mg/dL   Creatinine, Ser 0.37 (L) 0.44 - 1.00 mg/dL   Calcium 9.2 8.9 - 10.3 mg/dL   Total Protein 7.3 6.5 - 8.1 g/dL   Albumin 4.3 3.5 - 5.0 g/dL   AST 26 15 - 41 U/L   ALT 22 14 - 54 U/L   Alkaline Phosphatase 62 38 - 126 U/L   Total Bilirubin 0.4 0.3 - 1.2 mg/dL   GFR calc non Af Amer >60 >60 mL/min   GFR calc  Af Amer >60 >60 mL/min    Comment: (NOTE) The eGFR has been calculated using the CKD EPI equation. This calculation has not been validated in all clinical situations. eGFR's persistently <60 mL/min signify possible Chronic Kidney Disease.    Anion gap 7 5 - 15  Ethanol     Status: None   Collection Time: 04/15/16  8:53 PM  Result Value Ref Range   Alcohol, Ethyl (B) <5 <5 mg/dL    Comment:        LOWEST DETECTABLE LIMIT FOR SERUM ALCOHOL IS 5 mg/dL FOR MEDICAL PURPOSES ONLY   CBC with Diff     Status: None   Collection Time: 04/15/16  8:53 PM  Result Value Ref Range   WBC 7.2 3.6 - 11.0 K/uL   RBC 4.26 3.80 - 5.20 MIL/uL   Hemoglobin 13.3 12.0 - 16.0 g/dL   HCT 39.0 35.0 - 47.0 %   MCV 91.5 80.0 - 100.0 fL   MCH 31.2 26.0 - 34.0 pg   MCHC 34.1 32.0 - 36.0 g/dL   RDW 13.6 11.5 - 14.5 %   Platelets 305 150 - 440 K/uL   Neutrophils Relative % 56 %   Neutro Abs 4.0 1.4 - 6.5 K/uL   Lymphocytes Relative 34 %   Lymphs Abs 2.4 1.0 - 3.6 K/uL   Monocytes Relative 9 %   Monocytes Absolute 0.7 0.2 - 0.9 K/uL   Eosinophils Relative 1 %   Eosinophils Absolute 0.1 0 - 0.7 K/uL   Basophils Relative 0 %   Basophils Absolute 0.0 0 - 0.1 K/uL  Acetaminophen  level     Status: Abnormal   Collection Time: 04/15/16  8:53 PM  Result Value Ref Range   Acetaminophen (Tylenol), Serum <10 (L) 10 - 30 ug/mL    Comment:        THERAPEUTIC CONCENTRATIONS VARY SIGNIFICANTLY. A RANGE OF 10-30 ug/mL MAY BE AN EFFECTIVE CONCENTRATION FOR MANY PATIENTS. HOWEVER, SOME ARE BEST TREATED AT CONCENTRATIONS OUTSIDE THIS RANGE. ACETAMINOPHEN CONCENTRATIONS >150 ug/mL AT 4 HOURS AFTER INGESTION AND >50 ug/mL AT 12 HOURS AFTER INGESTION ARE OFTEN ASSOCIATED WITH TOXIC REACTIONS.   Salicylate level     Status: None   Collection Time: 04/15/16  8:53 PM  Result Value Ref Range   Salicylate Lvl <3.5 2.8 - 30.0 mg/dL  Urine Drug Screen, Qualitative ([facility] only)     Status: None   Collection Time: 04/15/16  9:12 PM  Result Value Ref Range   Tricyclic, Ur Screen NONE DETECTED NONE DETECTED   Amphetamines, Ur Screen NONE DETECTED NONE DETECTED   MDMA (Ecstasy)Ur Screen NONE DETECTED NONE DETECTED   Cocaine Metabolite,Ur [place] NONE DETECTED NONE DETECTED   Opiate, Ur Screen NONE DETECTED NONE DETECTED   Phencyclidine (PCP) Ur S NONE DETECTED NONE DETECTED   Cannabinoid 50 Ng, Ur [place] NONE DETECTED NONE DETECTED   Barbiturates, Ur Screen NONE DETECTED NONE DETECTED   Benzodiazepine, Ur Scrn NONE DETECTED NONE DETECTED   Methadone Scn, Ur NONE DETECTED NONE DETECTED    Comment: (NOTE) 465  Tricyclics, urine               Cutoff 1000 ng/mL 200  Amphetamines, urine             Cutoff 1000 ng/mL 300  MDMA (Ecstasy), urine           Cutoff 500 ng/mL 400  Cocaine Metabolite, urine       Cutoff 300 ng/mL 500  Opiate, urine  Cutoff 300 ng/mL 600  Phencyclidine (PCP), urine      Cutoff 25 ng/mL 700  Cannabinoid, urine              Cutoff 50 ng/mL 800  Barbiturates, urine             Cutoff 200 ng/mL 900  Benzodiazepine, urine           Cutoff 200 ng/mL 1000 Methadone, urine                Cutoff 300 ng/mL 1100 1200 The urine drug screen provides only a  preliminary, unconfirmed 1300 analytical test result and should not be used for non-medical 1400 purposes. Clinical consideration and professional judgment should 1500 be applied to any positive drug screen result due to possible 1600 interfering substances. A more specific alternate chemical method 1700 must be used in order to obtain a confirmed analytical result.  1800 Gas chromato graphy / mass spectrometry (GC/MS) is the preferred 1900 confirmatory method.   Urinalysis, Complete w Microscopic     Status: Abnormal   Collection Time: 04/15/16  9:12 PM  Result Value Ref Range   Color, Urine STRAW (A) YELLOW   APPearance CLEAR (A) CLEAR   Specific Gravity, Urine 1.011 1.005 - 1.030   pH 7.0 5.0 - 8.0   Glucose, UA NEGATIVE NEGATIVE mg/dL   Hgb urine dipstick NEGATIVE NEGATIVE   Bilirubin Urine NEGATIVE NEGATIVE   Ketones, ur NEGATIVE NEGATIVE mg/dL   Protein, ur NEGATIVE NEGATIVE mg/dL   Nitrite NEGATIVE NEGATIVE   Leukocytes, UA NEGATIVE NEGATIVE   RBC / HPF 0-5 0 - 5 RBC/hpf   WBC, UA 0-5 0 - 5 WBC/hpf   Bacteria, UA NONE SEEN NONE SEEN   Squamous Epithelial / LPF 0-5 (A) NONE SEEN   Mucous PRESENT     Current Facility-Administered Medications  Medication Dose Route Frequency Provider Last Rate Last Dose  . cyanocobalamin tablet 250 mcg  250 mcg Oral Daily Earleen Newport, MD   250 mcg at 04/16/16 1025  . levothyroxine (SYNTHROID, LEVOTHROID) tablet 112 mcg  112 mcg Oral QAC breakfast Earleen Newport, MD   112 mcg at 04/16/16 1024  . magnesium oxide (MAG-OX) tablet   Oral Daily Earleen Newport, MD   1 mg at 04/16/16 1026  . oxybutynin (DITROPAN) tablet 5 mg  5 mg Oral BID Earleen Newport, MD   5 mg at 04/16/16 1024  . venlafaxine XR (EFFEXOR-XR) 24 hr capsule 150 mg  150 mg Oral Daily Earleen Newport, MD   150 mg at 04/16/16 1023  . vitamin E capsule 400 Units  400 Units Oral Daily Earleen Newport, MD   400 Units at 04/16/16 1023  . zinc sulfate  capsule 220 mg  220 mg Oral q morning - 10a Earleen Newport, MD   220 mg at 04/16/16 1024   Current Outpatient Prescriptions  Medication Sig Dispense Refill  . Coenzyme Q10 (CO Q-10) 100 MG CAPS Take 1 tablet by mouth daily.    . IODINE, KELP, PO Take 1 tablet by mouth daily.    Marland Kitchen levothyroxine (SYNTHROID, LEVOTHROID) 112 MCG tablet Take 112 mcg by mouth daily before breakfast.    . MAGNESIUM PO Take 1 tablet by mouth daily.    Marland Kitchen oxybutynin (DITROPAN) 5 MG tablet Take 1 tablet (5 mg total) by mouth 2 (two) times daily. 180 tablet 3  . UNABLE TO FIND selium daily    . venlafaxine  XR (EFFEXOR-XR) 75 MG 24 hr capsule TAKE ONE CAPSULE BY MOUTH EVERY DAY (Patient taking differently: TAKE 2 caps po qd) 90 capsule 3  . vitamin E 400 UNIT capsule Take 400 Units by mouth daily.    . Zinc 100 MG TABS Take by mouth.    . Cyanocobalamin (VITAMIN B 12 PO) Take by mouth daily.    Marland Kitchen NATURE-THROID 97.5 MG TABS Take 1 tablet by mouth daily.  2  . PROBIOTIC CAPS Take by mouth daily.        Musculoskeletal: Strength & Muscle Tone: abnormal and decreased Gait & Station: unable to stand Patient leans: N/A  Psychiatric Specialty Exam: Physical Exam  Nursing note and vitals reviewed. Constitutional: She appears well-developed and well-nourished.  HENT:  Head: Normocephalic and atraumatic.  Eyes: Conjunctivae are normal. Pupils are equal, round, and reactive to light.  Neck: Normal range of motion.  Cardiovascular: Regular rhythm and normal heart sounds.   Respiratory: Effort normal. No respiratory distress.  GI: Soft.  Musculoskeletal: Normal range of motion.  Neurological: She is alert. She exhibits abnormal muscle tone. Gait abnormal.  Patient has muscular dystrophy and has greatly decreased strength and abilities in her lower body and needs a wheelchair.  Skin: Skin is warm and dry.  Psychiatric: Her speech is normal. Her mood appears anxious. She is slowed. Cognition and memory are normal. She  expresses impulsivity. She exhibits a depressed mood. She expresses suicidal ideation. She expresses suicidal plans.    Review of Systems  Constitutional: Negative.   HENT: Negative.   Eyes: Negative.   Respiratory: Negative.   Cardiovascular: Negative.   Gastrointestinal: Negative.   Musculoskeletal: Negative.   Skin: Negative.   Neurological: Positive for focal weakness.  Psychiatric/Behavioral: Positive for depression and suicidal ideas. Negative for hallucinations, memory loss and substance abuse. The patient is nervous/anxious and has insomnia.     Blood pressure 101/72, pulse 97, temperature 98.1 F (36.7 C), temperature source Oral, resp. rate 16, height 5' 5" (1.651 m), weight 90.7 kg (200 lb), SpO2 98 %.Body mass index is 33.28 kg/m.  General Appearance: Disheveled  Eye Contact:  Fair  Speech:  Slow  Volume:  Decreased  Mood:  Depressed  Affect:  Flat  Thought Process:  Goal Directed  Orientation:  Full (Time, Place, and Person)  Thought Content:  Logical, Rumination and Tangential  Suicidal Thoughts:  Yes.  with intent/plan  Homicidal Thoughts:  No  Memory:  Immediate;   Good Recent;   Fair Remote;   Fair  Judgement:  Impaired  Insight:  Shallow  Psychomotor Activity:  Decreased  Concentration:  Concentration: Fair  Recall:  [employer] of Knowledge:  Fair  Language:  Fair  Akathisia:  No  Handed:  Right  AIMS (if indicated):     Assets:  Communication Skills Desire for Improvement Housing Resilience  ADL's:  Intact  Cognition:  WNL  Sleep:        Treatment Plan Summary: Daily contact with patient to assess and evaluate symptoms and progress in treatment, Medication management and Plan 56 year old woman with a history of depression is reporting acute suicidal ideation with plan. She will be admitted to the psychiatric unit. Continue usual outpatient medicine. I have reviewed with nursing the patient's limitations. Patient assures me that she is able to do  all of her own transfer from an to bed and commode. Regular set of labs will be obtained. 15 minute checks downstairs.  Disposition: Recommend psychiatric Inpatient admission when  medically cleared. Supportive therapy provided about ongoing stressors.  Alethia Berthold, MD 04/16/2016 1:36 PM

## 2016-04-16 NOTE — Progress Notes (Signed)
Patient is currently under involuntary commitment due to suicidality and depression. The patient came down to the unit. She uses a wheelchair and is unable to stand or bear any weight on her legs. She suffers from muscular dystrophy.  Patient requested to be allowed to use her motorized wheelchair. Per administration this would not be possible.   Patient complained that the toilet was too high and the bed to low for her to get in and out of the toilet or in and out of the bed.  I discussed this with the nursing staff. We are trying to make arrangements to care for this patient. I will order a one to one.  Patient is requesting to be discharged home. Per Dr. Mat Carnelay praxis not she voiced thoughts about wanting to overdose prior to coming into the hospital and she had had a multitude of stressors. Urine my interaction with her she does appear dysphoric and blunted.  I didn't think it's safe to discharge today. We will try to accommodate her needs as best as we can.

## 2016-04-16 NOTE — ED Notes (Signed)
Pt sat up in bed with assistance and was given lunch tray

## 2016-04-16 NOTE — Plan of Care (Signed)
Problem: Activity: Goal: Interest or engagement in activities will improve Outcome: Not Progressing Adjusting to unit

## 2016-04-16 NOTE — ED Notes (Signed)
[facility] by Dr. Toni Amendlapacs.  Attending Physician will be Dr. Ardyth HarpsHernandez.   Patient [facility] Nurse BrucePhyllis.   Intake Paper Work has been signed and placed on patient chart.  ER staff is aware of the admission Glendon Axe( [facility]ER MD; Herbert SetaHeather  Patient's Nurse & Jonny RuizJohn Patient Access).

## 2016-04-16 NOTE — ED Notes (Signed)
Pt given breakfast tray
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
## 2016-04-16 NOTE — Tx Team (Signed)
Initial Treatment Plan 04/16/2016 7:22 PM [name] [id number]    PATIENT STRESSORS: Financial difficulties Health problems Marital or family conflict Medication change or noncompliance   PATIENT STRENGTHS: Ability for insight Active sense of humor Average or above average intelligence Capable of independent living Communication skills Supportive family/friends   PATIENT IDENTIFIED PROBLEMS: Suicide 04/16/16  Depression 04/16/16  Muscular Dystrophy 04/16/16                 DISCHARGE CRITERIA:  Ability to meet basic life and health needs Improved stabilization in mood, thinking, and/or behavior Medical problems require only outpatient monitoring  PRELIMINARY DISCHARGE PLAN: Outpatient therapy Return to previous living arrangement  PATIENT/FAMILY INVOLVEMENT: This treatment plan has been presented to and reviewed with the patient, [name], and/or family member,  .  The patient and family have been given the opportunity to ask questions and make suggestions.  Crist InfanteGwen A Maricella Filyaw, RN 04/16/2016, 7:22 PM

## 2016-04-16 NOTE — ED Notes (Signed)
Pt used bedpan. Pt informed tech she does not ambulate.

## 2016-04-16 NOTE — Progress Notes (Signed)
Admission Note:  D: Pt appeared depressed  With  a flat affect.  Pt  denies SI  Patient was IVC to hospital Depressed  Medical History Hashimoto disease Heart Murmur hyperthyroidism Muscular Dystrophy Thyroid Disease Urinary Incontinence Patient presents with financial , family issues husband  Left her in August  Saw him 2 qweeks ago with another  Women . Patient  Lives  Alone . Husband visited tonight Pt is redirectable and cooperative with assessment.      A: Pt admitted to unit per protocol, skin assessment and search done and no contraband found.  Pt  educated on therapeutic milieu rules. Pt was introduced to milieu by nursing staff.    R: Pt was receptive to education about the milieu .  15 min safety checks started. Clinical research associatewriter offered support

## 2016-04-16 NOTE — [facility] Suicide Risk Assessment (Addendum)
[facility]   Principal Problem: Severe recurrent major depression without psychotic features [facility](HCC) Discharge Diagnoses:  Patient Active Problem List   Diagnosis Date Noted  . Severe recurrent major depression without psychotic features (HCC) [F33.2] 04/16/2016  . Hypothyroidism [E03.9] 04/16/2016  . OVERFLOW INCONTINENCE [N39.498] 02/21/2009  . HYPERCHOLESTEROLEMIA [E78.00] 01/07/2008  . MUSCULAR DYSTROPHY [G71.0] 01/07/2008  . CARDIAC MURMUR, BENIGN [R01.1] 01/07/2008     Psychiatric Specialty Exam: ROS  Blood pressure 107/77, pulse (!) 108, temperature 97.8 F (36.6 C), temperature source Oral, resp. rate 18, height 5\' 5"  (1.651 m), weight 90.7 kg (200 lb), SpO2 97 %.Body mass index is 33.28 kg/m.                                                       Mental Status Per Nursing Assessment::   On Admission:     Demographic Factors:  Caucasian  Loss Factors: Loss of significant relationship, Decline in physical health and Financial problems/change in socioeconomic status  Historical Factors: NA  Risk Reduction Factors:   Sense of responsibility to family, Religious beliefs about death and Positive social support  Continued Clinical Symptoms:  Depression:   Insomnia  Cognitive Features That Contribute To Risk:  Polarized thinking    Suicide Risk:  Minimal: No identifiable suicidal ideation.  Patients presenting with no risk factors but with morbid ruminations; may be classified as minimal risk based on the severity of the depressive symptoms  Follow-up Information    Cara Figueira,LPC. Go on 04/21/2016.   Why:  Please meet with your therapist, Blima SingerCara Angerer on March 5th for your weekly therapy sessions. She asks that you call to schedule a time that works best for you. If you have any questions or concerns, call Blima Singerara Kuiken directly. Bring discharge papers! Contact information: Address: [address] [place], [place] [address] Phone:  [phone] No fax at office, asks for info. to be mailed             Jimmy FootmanHernandez-Gonzalez,  Brenlee Koskela, MD 04/18/2016, 10:31 AM
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
## 2016-04-16 NOTE — Plan of Care (Signed)
Problem: Coping: Goal: Ability to verbalize frustrations and anger appropriately will improve Outcome: Progressing Able to use coping  Skills to address some of the issues on the unit.

## 2016-04-17 ENCOUNTER — Encounter: Payer: Self-pay | Admitting: Psychiatry

## 2016-04-17 DIAGNOSIS — F332 Major depressive disorder, recurrent severe without psychotic features: Principal | ICD-10-CM

## 2016-04-17 LAB — LIPID PANEL
CHOLESTEROL: 233 mg/dL — AB (ref 0–200)
HDL: 43 mg/dL (ref >40)
LDL Cholesterol: 163 mg/dL — ABNORMAL HIGH (ref 0–99)
Total CHOL/HDL Ratio: 5.4 RATIO
Triglycerides: 135 mg/dL (ref <150)
VLDL: 27 mg/dL (ref 0–40)

## 2016-04-17 LAB — TSH: TSH: 0.212 u[IU]/mL — ABNORMAL LOW (ref 0.350–4.500)

## 2016-04-17 MED ORDER — TRAZODONE HCL 50 MG PO TABS
50.0000 mg | ORAL_TABLET | Freq: Every day | ORAL | Status: DC
  Administered 2016-04-17: 50 mg via ORAL
  Filled 2016-04-17: qty 1
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
## 2016-04-17 NOTE — Plan of Care (Signed)
Problem: Activity: Goal: Sleeping patterns will improve Outcome: Progressing Patient slept for Estimated Hours of 8.45; every 15 minutes safety round maintained, no injury or falls during this shift.    

## 2016-04-17 NOTE — [facility] Counselor (Signed)
Adult Comprehensive Assessment  Patient ID: Katrina PatteeShirley Jean Moore, female   DOB: November 19, 1960, 56 y.o.   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  Social relationships: Various strained relationships  Substance abuse: Pt denies substance abuse  Bereavement / Loss: No stressors identified   Living/Environment/Situation:  Living Arrangements: Alone (Pt has two roommates ) Living conditions (as described by patient or guardian): They are fine - currently working on renovations due to physical needs   What is atmosphere in current home: Comfortable, Supportive  Family History:  Marital status: Separated Separated, when?: Few months ago  What types of issues is patient dealing with in the relationship?: Husband was not taking care of pt's physical needs  Are you sexually active?:  (Unknown) What is your sexual orientation?: Heterosexual  Has your sexual activity been affected by drugs, alcohol, medication, or emotional stress?: N/A Does patient have children?: Yes How many children?: 1 How is patient's relationship with their children?: Pt has one son who relationship has been strained since separation   Childhood History:  By whom was/is the patient raised?: Both parents Description of patient's relationship with caregiver when they were a child: Had a good relationship with both parents  Patient's description of current relationship with people who raised him/her: Mother passed away and still get along well with father  Does patient have  siblings?: Yes Number of Siblings: 4 Description of patient's current relationship with siblings: One sister who passed away in 2004, one brother passed away - one sister and one brother still living and pt has a good relationship with them Did patient suffer any verbal/emotional/physical/sexual abuse as a child?: No Did patient suffer from severe childhood neglect?: No Has patient ever been sexually abused/assaulted/raped as an adolescent or adult?: No Was the patient ever a victim of a crime or a disaster?: No Witnessed domestic violence?: No Has patient been effected by domestic violence as an adult?: No  Education:  Highest grade of school patient has completed: 12 + Surveyor, mineralstechnical certification Currently a student?: No Learning disability?: No  Employment/Work Situation:   Employment situation: On disability 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  How long has patient been on disability: At age 56 years old  What is the longest time patient has a held a job?: 3 years  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  Has patient ever been in the [employer] Lilly and Companymilitary?: No Has patient ever served in combat?: No Did You Receive Any Psychiatric Treatment/Services While in Equities traderthe Military?: No Are There Guns or Other Weapons in Your Home?: No Are These ComptrollerWeapons Safely Secured?:  (N/A)  Financial Resources:   Financial resources: Insurance claims handlereceives SSDI  Alcohol/Substance Abuse:   What has been your use of drugs/alcohol within the last 12 months?: Pt denies drug/alcohol use  If attempted suicide, did drugs/alcohol play a role in this?: No Alcohol/Substance Abuse Treatment Hx: Denies past history Has alcohol/substance abuse ever caused legal problems?: No  Social Support System:   Patient's Community Support System: Good Describe Community Support System: Patient has a great relationship with church family  Type of faith/religion: Christianity  How does  patient's faith help to cope with  current illness?: Praise and worhsip music and prayer   Leisure/Recreation:   Leisure and Hobbies: Attending and volunteering in church; watching movies, reading  Strengths/Needs:   What things does the patient do well?: Encourage others  In what areas does patient struggle / problems for patient: Getting through the "storm"   Discharge Plan:   Will patient be returning to same living situation after discharge?: Yes Currently receiving community mental health services: No If no, would patient like referral for services when discharged?: Yes (What [place] ) Does patient have financial barriers related to discharge medications?: No  Summary/Recommendations:   Summary and Recommendations (to be completed by the evaluator): Patient presented to the hospital admitted for depressive symptoms and a suicidal ideation. Pt is a 56 year old woman living in [place], [place]. Pt's primary diagnosis is major depressive disorder. Pt reports primary triggers for admission was the loss of significant relationships. Her and her husband separated in August 2017 and things have spiraled out of control since then.  Pt states that she will not hurt herself and was just "talking" at the time. Pt lists supports as a few of her friends and her father. Patient will benefit from crisis stabilization, medication evaluation, group therapy, and psycho education in addition to case management for discharge planning. Patient and CSW reviewed pt's identified goals and treatment plan. Pt verbalized understanding and agreed to treatment plan. At discharge it is recommended that patient remain compliant with established plan and continue treatment.  [name], MSW, LCSW-A  04/17/2016

## 2016-04-17 NOTE — [facility] Group Notes (Signed)
[facility] Group Notes:  (Nursing/MHT/Case Management/Adjunct)  Date:  04/17/2016  Time:  1:20 AM  Type of Therapy:  Psychoeducational Skills  Participation Level:  Did Not Attend Katrina NeerJackie L Seldon Moore 04/17/2016, 1:20 AM

## 2016-04-17 NOTE — Progress Notes (Signed)
D:Staff able to get patient out of bed this shift safely in wheelchair . Affect cheerful on approach . Assisted with  Voiding into the bedside commode. Limited interaction with  Her peers on the unit .  Patient able to meet with her doctor this shift . Also able to attend group therapy .  Patient stated slept good last night .Stated appetite is good and energy level  low. Stated concentration is good . Stated on Depression scale ,5 hopeless 0 and anxiety 5 .( low 0-10 high) Denies suicidal  homicidal ideations  .  No auditory hallucinations  No pain concerns . Appropriate ADL'S.   A: Encourage patient participation with unit programming . Instruction  Given on  Medication , verbalize understanding. R: Voice no other concerns. Staff continue to monitor

## 2016-04-17 NOTE — Progress Notes (Signed)
Recreation Therapy Notes  Date: 03.01.18 Time: 1:00 pm Location: Craft Room  Group Topic: Leisure Education  Goal Area(s) Addresses:  Patient will identify activities for each letter of the alphabet. Patient will verbalize ability to integrate positive leisure into life post d/c. Patient will verbalize ability to use leisure as a Associate Professorcoping skill.  Behavioral Response: Attentive, Interactive  Intervention: Leisure Alphabet.  Activity: Patients were given a leisure alphabet worksheet and were instructed to write healthy leisure activities for each letter of the alphabet.   Education: LRT educated patients on what they need to participate in leisure.  Education Outcome: Acknowledges education/In group clarification offered  Clinical Observations/Feedback: Patient wrote healthy leisure activities. Patient contributed to group discussion by stating healthy leisure activities and how she can put leisure back into her schedule.  Jacquelynn CreeGreene,Sayf Kerner M, LRT/CTRS 04/17/2016 2:00 PM

## 2016-04-17 NOTE — [facility] Suicide Risk Assessment (Signed)
[facility] INPATIENT:  Family/Significant Other Suicide Prevention Education  Suicide Prevention Education:  Education Completed; father, Mauri ReadingJoe Muhlbauer ph#: [phone]) [phone] has been identified by the patient as the family member/significant other with whom the patient will be residing, and identified as the person(s) who will aid the patient in the event of a mental health crisis (suicidal ideations/suicide attempt).  With written consent from the patient, the family member/significant other has been provided the following suicide prevention education, prior to the and/or following the discharge of the patient.  The suicide prevention education provided includes the following:  Suicide risk factors  Suicide prevention and interventions  National Suicide Hotline telephone number  [facility] telephone number  [facility] Assistance 911  [facility] and/or Residential Mobile Crisis Unit telephone number  Request made of family/significant other to:  Remove weapons (e.g., guns, rifles, knives), all items previously/currently identified as safety concern.    Remove drugs/medications (over-the-counter, prescriptions, illicit drugs), all items previously/currently identified as a safety concern.  The family member/significant other verbalizes understanding of the suicide prevention education information provided.  The family member/significant other agrees to remove the items of safety concern listed above.  Lynden OxfordKadijah R Andersson [place], MSW, LCSW-A 04/17/2016, 1:03 PM

## 2016-04-17 NOTE — [facility] Group Notes (Signed)
[facility] LCSW Group Therapy Note  Type of Therapy and Topic:  Group Therapy:  Goals Group: SMART Goals  Participation Level:  Patient did not attend group. CSW invited patient to group.   Description of Group:   The purpose of a daily goals group is to assist and guide patients in setting recovery/wellness-related goals.  The objective is to set goals as they relate to the crisis in which they were admitted. Patients will be using SMART goal modalities to set measurable goals.  Characteristics of realistic goals will be discussed and patients will be assisted in setting and processing how one will reach their goal. Facilitator will also assist patients in applying interventions and coping skills learned in psycho-education groups to the SMART goal and process how one will achieve defined goal.  Therapeutic Goals: -Patients will develop and document one goal related to or their crisis in which brought them into treatment. -Patients will be guided by LCSW using SMART goal setting modality in how to set a measurable, attainable, realistic and time sensitive goal.  -Patients will process barriers in reaching goal. -Patients will process interventions in how to overcome and successful in reaching goal.   Summary of Patient Progress:  Patient Goal: Patient did not attend group. CSW invited patient to group.    Therapeutic Modalities:   Motivational Interviewing Engineer, manufacturing systemsCognitive Behavioral Therapy Crisis Intervention Model SMART goals setting  Ladell Lea G. Garnette CzechSampson MSW, [facility] HawaiiCSWA 04/17/2016 10:52 AM

## 2016-04-17 NOTE — H&P (Signed)
Psychiatric Admission Assessment Adult  Patient Identification: [name] MRN:  [id number] Date of Evaluation:  04/17/2016 Chief Complaint:  Depression Principal Diagnosis: Severe recurrent major depression without psychotic features [facility](HCC) Diagnosis:   Patient Active Problem List   Diagnosis Date Noted  . Severe recurrent major depression without psychotic features (HCC) [F33.2] 04/16/2016  . Hypothyroidism [E03.9] 04/16/2016  . OVERFLOW INCONTINENCE [N39.498] 02/21/2009  . HYPERCHOLESTEROLEMIA [E78.00] 01/07/2008  . MUSCULAR DYSTROPHY [G71.0] 01/07/2008  . CARDIAC MURMUR, BENIGN [R01.1] 01/07/2008   History of Present Illness:   56 year old separated Caucasian female from [place] who presented to our emergency department voluntarily on February 27 complaining of thoughts of wanting to overdose on Advil.  Patient suffers from muscular dystrophy which was diagnosed at the age of 56. Patient is wheelchair dependent as she is unable to bear any weight on her legs.  Patient has past history of depression. Says that she has been treated with venlafaxine for about 20 years.  Patient reports having multiple stressors over the last several months. She says that she was married with her husband for 30 years. They have been separated since August 2017. Two weeks ago she saw her husband with his new girlfriend.   Patient is now filing for divorce. In preparation for it she wrote an email to her lawyer detailing the problems in her marriage. She made the decision to email a copy of this to her son as she wanted him to know her side of the story.  Patient's son is now upset as he found the contents of the email hurtful and he doesn't want to be in the middle of the fight.  In addition to this the patient pay $21,000 to contractor to remodel her bathroom as she needed multiple upgrades to accommodate her physical limitations. Patient states that the remodeling was a complete  failure. She ended up having to pay a different contractor to redo it. Patient asked for her money back but the contractor refused to pay her. She is now going to sue him.  Unfortunately this contractor is also the son of the pt's aid, who has worked for the patient for 2 years. The aid is not longer talking to the patient.   One of the patient's best friend told her that good Christians don't sue other Christians. This led into an argument and the patient is not longer talking to her friend. This friend's husband was the patient's financial advisor and he has told her he is not longer going to continue to work with her.  This clinic the second contractor was finishing up with the tile in her bathroom. She had to be out of the house as she could not use her motorized wheelchair while he was making the repairments. Patient called her son and asked if she could stay with him but he told her he did not want her to visit him as he was a still angry.  As a result of all of these events the patient felt very rejected. The conversation with her son push her to the edge and called 911.  The patient was very tearful during the interview. She's been trying to ask for discharge since yesterday saying that we don't have all the accommodations that she needs in order to get around.   Today she denies having intentions of actually hurting herself. She denies having homicidal ideation or auditory or visual hallucinations.  Trauma history denies  Substance abuse history denies   Associated Signs/Symptoms:  Depression Symptoms:  depressed mood, insomnia, fatigue, suicidal thoughts with specific plan, (Hypo) Manic Symptoms:  Irritable Mood, Anxiety Symptoms:  Excessive Worry, Psychotic Symptoms:  denies PTSD Symptoms: Negative Total Time spent with patient: 1 hour  Past Psychiatric History: Denies ever being hospitalized before. Denies any history of self injury. History of suicidal  attempts.  Currently taking Effexor XR 75 mg which is prescribed by her primary care. She seen a counselor in [place] once q week.  Is the patient at risk to self? Yes.    Has the patient been a risk to self in the past 6 months? No.  Has the patient been a risk to self within the distant past? No.  Is the patient a risk to others? No.  Has the patient been a risk to others in the past 6 months? No.  Has the patient been a risk to others within the distant past? No.   Alcohol Screening: 1. How often do you have a drink containing alcohol?: 2 to 4 times a month 2. How many drinks containing alcohol do you have on a typical day when you are drinking?: 1 or 2 3. How often do you have six or more drinks on one occasion?: Never Preliminary Score: 0 4. How often during the last year have you found that you were not able to stop drinking once you had started?: Never 5. How often during the last year have you failed to do what was normally expected from you becasue of drinking?: Never 6. How often during the last year have you needed a first drink in the morning to get yourself going after a heavy drinking session?: Never 7. How often during the last year have you had a feeling of guilt of remorse after drinking?: Never 9. Have you or someone else been injured as a result of your drinking?: No 10. Has a relative or friend or a doctor or another health worker been concerned about your drinking or suggested you cut down?: No Alcohol Use Disorder Identification Test Final Score (AUDIT): 2 Brief Intervention: AUDIT score less than 7 or less-screening does not suggest unhealthy drinking-brief intervention not indicated  Past Medical History:  Past Medical History:  Diagnosis Date  . Depression   . FSHD (facioscapulohumeral muscular dystrophy) (HCC)   . Hashimoto's disease   . Heart murmur   . Hyperthyroidism   . Muscular dystrophy (HCC)   . Thyroid disease   . Urinary incontinence   . Vitamin D  deficiency     Past Surgical History:  Procedure Laterality Date  . CESAREAN SECTION    . TOTAL KNEE ARTHROPLASTY Right   . TUBAL LIGATION  1996   Bilateral   Family History:  Family History  Problem Relation Age of Onset  . Muscular dystrophy Mother   . Stroke Mother   . Cancer Mother     uterine   . Hyperlipidemia Father   . Muscular dystrophy Sister   . Heart failure Sister   . Aneurysm Brother   . Breast cancer Neg Hx   . Colon cancer Neg Hx   . Ovarian cancer Neg Hx    Family Psychiatric  History: Patient's mother died of muscular dystrophy in 07/05/2003, the patient's sister died of muscular dystrophy in 2002/07/05. She has a brother who died of an aneurysm in 07/04/05. She has 2 siblings living when she is close to. Her father is 36 years old and lives in [place].  Tobacco Screening: Have you used any  form of tobacco in the last 30 days? (Cigarettes, Smokeless Tobacco, Cigars, and/or Pipes): No   Social History: Patient is currently separated from her husband. She is only been married one time. They were together for 30 years. They have a son who is 59 years old and lives in [place].  Patient worked as a Interior and spatial designer for many years. At age 51 she was diagnosed with muscular dystrophy and has been receiving disability since then. She has a degree in cosmetology. She denies any legal history. She is very active in her church. History  Alcohol Use  . Yes     History  Drug Use No    Additional Social History: Marital status: Separated Separated, when?: Few months ago  What types of issues is patient dealing with in the relationship?: Husband was not taking care of pt's physical needs  Are you sexually active?:  (Unknown) What is your sexual orientation?: Heterosexual  Has your sexual activity been affected by drugs, alcohol, medication, or emotional stress?: N/A Does patient have children?: Yes How many children?: 1 How is patient's relationship with their children?: Pt has one son  who relationship has been strained since separation       Allergies:  No Known Allergies   Lab Results:  Results for orders placed or performed during the hospital encounter of 04/16/16 (from the past 48 hour(s))  Lipid panel     Status: Abnormal   Collection Time: 04/17/16  7:25 AM  Result Value Ref Range   Cholesterol 233 (H) 0 - 200 mg/dL   Triglycerides 454 <098 mg/dL   HDL 43 >11 mg/dL   Total CHOL/HDL Ratio 5.4 RATIO   VLDL 27 0 - 40 mg/dL   LDL Cholesterol 914 (H) 0 - 99 mg/dL    Comment:        Total Cholesterol/HDL:CHD Risk Coronary Heart Disease Risk Table                     Men   Women  1/2 Average Risk   3.4   3.3  Average Risk       5.0   4.4  2 X Average Risk   9.6   7.1  3 X Average Risk  23.4   11.0        Use the calculated Patient Ratio above and the CHD Risk Table to determine the patient's CHD Risk.        ATP III CLASSIFICATION (LDL):  <100     mg/dL   Optimal  782-956  mg/dL   Near or Above                    Optimal  130-159  mg/dL   Borderline  213-086  mg/dL   High  >578     mg/dL   Very High   TSH     Status: Abnormal   Collection Time: 04/17/16  7:25 AM  Result Value Ref Range   TSH 0.212 (L) 0.350 - 4.500 uIU/mL    Comment: Performed by a 3rd Generation assay with a functional sensitivity of <=0.01 uIU/mL.    Blood Alcohol level:  Lab Results  Component Value Date   ETH <5 04/15/2016    Metabolic Disorder Labs:  No results found for: HGBA1C, MPG No results found for: PROLACTIN Lab Results  Component Value Date   CHOL 233 (H) 04/17/2016   TRIG 135 04/17/2016   HDL 43 04/17/2016   CHOLHDL 5.4 04/17/2016  VLDL 27 04/17/2016   LDLCALC 163 (H) 04/17/2016   LDLCALC 129 (H) 02/01/2010    Current Medications: Current Facility-Administered Medications  Medication Dose Route Frequency Provider Last Rate Last Dose  . acetaminophen (TYLENOL) tablet 650 mg  650 mg Oral Q6H PRN Audery Amel, MD      . alum & mag hydroxide-simeth  (MAALOX/MYLANTA) 200-200-20 MG/5ML suspension 30 mL  30 mL Oral Q4H PRN Audery Amel, MD      . levothyroxine (SYNTHROID, LEVOTHROID) tablet 112 mcg  112 mcg Oral QAC breakfast Audery Amel, MD   112 mcg at 04/17/16 0650  . oxybutynin (DITROPAN) tablet 5 mg  5 mg Oral BID Audery Amel, MD   5 mg at 04/17/16 0902  . venlafaxine XR (EFFEXOR-XR) 24 hr capsule 150 mg  150 mg Oral Daily Audery Amel, MD   150 mg at 04/17/16 0902   PTA Medications: Prescriptions Prior to Admission  Medication Sig Dispense Refill Last Dose  . Coenzyme Q10 (CO Q-10) 100 MG CAPS Take 1 tablet by mouth daily.   unknown at unknown  . Cyanocobalamin (VITAMIN B 12 PO) Take by mouth daily.   Not Taking at Unknown time  . IODINE, KELP, PO Take 1 tablet by mouth daily.   unknown at unknown  . levothyroxine (SYNTHROID, LEVOTHROID) 112 MCG tablet Take 112 mcg by mouth daily before breakfast.   unknown at unknown  . MAGNESIUM PO Take 1 tablet by mouth daily.   unknown at unknown  . NATURE-THROID 97.5 MG TABS Take 1 tablet by mouth daily.  2 Not Taking at Unknown time  . oxybutynin (DITROPAN) 5 MG tablet Take 1 tablet (5 mg total) by mouth 2 (two) times daily. 180 tablet 3 unknown at unknown  . PROBIOTIC CAPS Take by mouth daily.     Not Taking at Unknown time  . UNABLE TO FIND selium daily   unknown at unknown  . venlafaxine XR (EFFEXOR-XR) 75 MG 24 hr capsule TAKE ONE CAPSULE BY MOUTH EVERY DAY (Patient taking differently: TAKE 2 caps po qd) 90 capsule 3 unknown at unknown  . vitamin E 400 UNIT capsule Take 400 Units by mouth daily.   unknown at unknown  . Zinc 100 MG TABS Take by mouth.   unknown at unknown    Musculoskeletal: Strength & Muscle Tone: within normal limits Gait & Station: normal Patient leans: N/A  Psychiatric Specialty Exam: Physical Exam  Constitutional: She is oriented to person, place, and time. She appears well-developed and well-nourished.  HENT:  Head: Normocephalic and atraumatic.  Eyes:  Conjunctivae and EOM are normal.  Neck: Normal range of motion.  Respiratory: Effort normal.  Neurological: She is alert and oriented to person, place, and time.    Review of Systems  HENT: Negative.   Eyes: Negative.   Respiratory: Negative.   Cardiovascular: Negative.   Gastrointestinal: Negative.   Genitourinary: Negative.   Musculoskeletal: Negative.   Skin: Negative.   Neurological: Positive for weakness.  Endo/Heme/Allergies: Negative.   Psychiatric/Behavioral: Positive for depression. Negative for hallucinations, memory loss, substance abuse and suicidal ideas. The patient has insomnia. The patient is not nervous/anxious.     Blood pressure 115/79, pulse 80, temperature 97.8 F (36.6 C), temperature source Oral, resp. rate 16, height 5\' 5"  (1.651 m), weight 90.7 kg (200 lb), SpO2 95 %.Body mass index is 33.28 kg/m.  General Appearance: Disheveled  Eye Contact:  Good  Speech:  Clear and Coherent  Volume:  Normal  Mood:  Dysphoric  Affect:  Blunt  Thought Process:  Linear and Descriptions of Associations: Intact  Orientation:  Full (Time, Place, and Person)  Thought Content:  Hallucinations: None  Suicidal Thoughts:  No  Homicidal Thoughts:  No  Memory:  Immediate;   Good Recent;   Good Remote;   Good  Judgement:  Poor  Insight:  Shallow  Psychomotor Activity:  Decreased  Concentration:  Concentration: Good and Attention Span: Good  Recall:  Good  Fund of Knowledge:  Good  Language:  Good  Akathisia:  No  Handed:    AIMS (if indicated):     Assets:  Architect Housing Social Support Talents/Skills Transportation  ADL's:  Impaired  Cognition:  WNL  Sleep:  Number of Hours: 8.45    Treatment Plan Summary:  Patient is a 56 year old separated Caucasian female with muscular dystrophy. She presented to our emergency department with worsening depression, suicidal ideation with a plan of overdosing on ibuprofen as of this  sort of feeling rejected by her husband, her son and her friends.  Patient stated that her current situation has caused significant distress. She stated that she has never been this distressed ever in her life.  Major depressive disorder continue Effexor XR however we will increase the dose to 150 mg a day  For insomnia and anxiety and will start her on clonazepam 0.5 mg daily at bedtime  For hypothyroidism she'll be continued on Synthroid 112 g a day  For incontinence she'll be continued on Ditropan 5 mg twice a day.  Muscular dystrophy: Patient to use a wheelchair. She has a special call button in her room. We tried a one-to-one yesterday but she is able to ask for help when needed. She needs assistance with getting in and out of the wheelchair, with  toileting  Diet regular  Precautions every 15 minute checks  Hospitalization and status involuntary commitment  Follow-up once stable she will be discharged to follow up with a psychiatrist and she will be scheduled to continue to follow-up with her therapist  Disposition was stable she will be discharged back to her home.   Physician Treatment Plan for Primary Diagnosis: Severe recurrent major depression without psychotic features (HCC) Long Term Goal(s): Improvement in symptoms so as ready for discharge  Short Term Goals: Ability to verbalize feelings will improve, Ability to disclose and discuss suicidal ideas and Ability to identify and develop effective coping behaviors will improve  Physician Treatment Plan for Secondary Diagnosis: Principal Problem:   Severe recurrent major depression without psychotic features (HCC) Active Problems:   HYPERCHOLESTEROLEMIA   MUSCULAR DYSTROPHY   OVERFLOW INCONTINENCE   Hypothyroidism  Long Term Goal(s): Improvement in symptoms so as ready for discharge  Short Term Goals: Ability to identify changes in lifestyle to reduce recurrence of condition will improve and Ability to identify  triggers associated with substance abuse/mental health issues will improve  I certify that inpatient services furnished can reasonably be expected to improve the patient's condition.    Jimmy Footman, MD 3/1/20181:24 PM

## 2016-04-17 NOTE — Plan of Care (Signed)
Problem: Coping: Goal: Ability to cope will improve Outcome: Progressing Attending  Unit programing , working on coping skills

## 2016-04-17 NOTE — [facility] Suicide Risk Assessment (Signed)
[facility] Admission Suicide Risk Assessment   Nursing information obtained from:    Demographic factors:    Current Mental Status: [facility]   Loss Factors:    Historical Factors:    Risk Reduction Factors:     Total Time spent with patient: 1 hour Principal Problem: Severe recurrent major depression without psychotic features [facility](HCC) Diagnosis:   Patient Active Problem List   Diagnosis Date Noted  . Severe recurrent major depression without psychotic features (HCC) [F33.2] 04/16/2016  . Hypothyroidism [E03.9] 04/16/2016  . OVERFLOW INCONTINENCE [N39.498] 02/21/2009  . HYPERCHOLESTEROLEMIA [E78.00] 01/07/2008  . MUSCULAR DYSTROPHY [G71.0] 01/07/2008  . CARDIAC MURMUR, BENIGN [R01.1] 01/07/2008   Subjective Data:   Continued Clinical Symptoms:  Alcohol Use Disorder Identification Test Final Score (AUDIT): 2 The "Alcohol Use Disorders Identification Test", Guidelines for Use in Primary Care, Second Edition.  World Science writerHealth Organization [facility](WHO). Score between 0-7:  no or low risk or alcohol related problems. Score between 8-15:  moderate risk of alcohol related problems. Score between 16-19:  high risk of alcohol related problems. Score 20 or above:  warrants further diagnostic evaluation for alcohol dependence and treatment.   CLINICAL FACTORS:   Depression:   Insomnia Previous Psychiatric Diagnoses and Treatments Medical Diagnoses and Treatments/Surgeries    Psychiatric Specialty Exam: Physical Exam  ROS  Blood pressure 115/79, pulse 80, temperature 97.8 F (36.6 C), temperature source Oral, resp. rate 16, height 5\' 5"  (1.651 m), weight 90.7 kg (200 lb), SpO2 95 %.Body mass index is 33.28 kg/m.                                                    Sleep:  Number of Hours: 8.45      COGNITIVE FEATURES THAT CONTRIBUTE TO RISK:  Polarized thinking    SUICIDE RISK:   Mild:  Suicidal ideation of limited frequency, intensity, duration, and specificity.  There are no  identifiable plans, no associated intent, mild dysphoria and related symptoms, good self-control (both objective and subjective assessment), few other risk factors, and identifiable protective factors, including available and accessible social support.  [facility] OF CARE[facility]  I certify that inpatient services furnished can reasonably be expected to improve the patient's condition.   Jimmy FootmanHernandez-Gonzalez,  Bearl Talarico, MD 04/17/2016, 1:51 PM

## 2016-04-17 NOTE — [facility] Group Notes (Signed)
[facility] LCSW Group Therapy  04/17/2016 11:21 AM  Type of Therapy:  Group Therapy  Participation Level:  Patient did not attend group. CSW invited patient to group.   Summary of Progress/Problems: Balance in life: Patients will discuss the concept of balance and how it looks and feels to be unbalanced. Pt will identify areas in their life that is unbalanced and ways to become more balanced. They discussed what aspects in their lives has influenced their self care. Patients also discussed self care in the areas of self regulation/control, hygiene/appearance, sleep/relaxation, healthy leisure, healthy eating habits, exercise, inner peace/spirituality, self improvement, sobriety, and health management. They were challenged to identify changes that are needed in order to improve self care.  Marzetta Lanza G. Garnette CzechSampson MSW, LCSWA 04/17/2016, 11:21 AM

## 2016-04-17 NOTE — Progress Notes (Signed)
Patient ID: [name], female   DOB: 02/07/1961, 56 y.o.   MRN: [id number]  Pleasant, A&Ox3, visible but did not attend the Wrap - Up Group, visitation by husband went well according to patient; denied SI/HI, denied any psychotic symptoms; on 1:1 continuous observation while awake, she knows how to maneuver herself with one or two [facility]. Bed pan provide, patient frequently monitored for safety. Patient still has upper body strength and understands her limitations. No medications scheduled at bedtime, no PRN given.

## 2016-04-17 NOTE — Progress Notes (Signed)
Recreation Therapy Notes  INPATIENT RECREATION THERAPY ASSESSMENT  Patient Details Name: [name] MRN: [id number] DOB: [birth year] Today's Date: 04/17/2016  Patient Stressors: Family, Relationship, Friends, Other (Comment) (Had an argument with son and wanted to see him, but he told her he did not want to see her; going throgh separation with husband - saw him with a girl a couple weeks ago; feels rejected by family and friends; hired friend's son to remodel bathroom) he did a bad job and she has to get her bathroom fixed by someone else  Coping Skills:   Art/Dance, Talking, Music, Other (Comment)  Personal Challenges: Concentration, Stress Management, Trusting Others (Struggled with concentration recently with the separation and stress; struggles with trusting others recently)  Leisure Interests (2+):  Community - Shopping mall, Garment/textile technologistCommunity - Surveyor, miningMovies  Awareness of Community Resources:  Yes  Community Resources:  Research scientist (physical sciences)Movie Theaters, Tree surgeonMall  Current Use: Yes  If no, Barriers?:    Patient Strengths:  Encouraging  Patient Identified Areas of Improvement:  Get through this mess  Current Recreation Participation:  Movies  Patient Goal for Hospitalization:  To get out  [place]idence[place]  County of Residence:  [place]   Current SI (including self-harm):  No  Current HI:  No  Consent to Intern Participation: N/A   Jacquelynn CreeGreene,Cleotha Tsang M, LRT/CTRS 04/17/2016, 4:17 PM

## 2016-04-18 LAB — HEMOGLOBIN A1C
Hgb A1c MFr Bld: 5.5 % (ref 4.8–5.6)
Mean Plasma Glucose: 111 mg/dL

## 2016-04-18 MED ORDER — VENLAFAXINE HCL ER 150 MG PO CP24: 300.0000 mg | ORAL_CAPSULE | Freq: Every day | ORAL | 0 refills | Status: DC

## 2016-04-18 MED ORDER — TRAZODONE HCL 50 MG PO TABS: 50.0000 mg | ORAL_TABLET | Freq: Every day | ORAL | 0 refills | Status: DC

## 2016-04-18 NOTE — Plan of Care (Signed)
Problem: [facility] Participation in Recreation Therapeutic Interventions Goal: STG-Other Recreation Therapy Goal (Specify) STG: Stress Management - Within 3 treatment sessions, patient will verbalize understanding of the stress management techniques in one treatment session to increase stress management skills.  Outcome: Completed/Met Date Met: 04/18/16 Treatment Session 1; Completed 1 out of 1: At approximately 9:00 am, LRT met with patient in patient room. LRT educated and provided patient with handouts on stress management techniques. Patient verbalized understanding. LRT encouraged patient to read over and practice the stress management techniques.  Leonette Monarch, LRT/CTRS 03.02.18 4:17 pm

## 2016-04-18 NOTE — [facility] Group Notes (Addendum)
[facility] Group Notes:  (Nursing/MHT/Case Management/Adjunct)  Date:  04/18/2016  Time:  3:39 AM  Type of Therapy:  Psychoeducational Skills  Participation Level:  Did Not Attend  Summary of Progress/Problems:  [name] 04/18/2016, 3:39 AM

## 2016-04-18 NOTE — Progress Notes (Signed)
Recreation Therapy Notes  INPATIENT RECREATION TR PLAN  Patient Details Name: Sheria Rosello MRN: [id number] DOB: 13-Dec-1960 Today's Date: 04/18/2016  Rec Therapy Plan Is patient appropriate for Therapeutic Recreation?: Yes Treatment times per week: At least once a week TR Treatment/Interventions: 1:1 session, Group participation (Comment) (Appropriate participation in daily recreational therapy tx)  Discharge Criteria Pt will be discharged from therapy if:: Treatment goals are met, Discharged Treatment plan/goals/alternatives discussed and agreed upon by:: Patient/family  Discharge Summary Short term goals set: See Care Plan Short term goals met: Complete Progress toward goals comments: One-to-one attended Which groups?: Leisure education One-to-one attended: Stress Management Reason goals not met: N/A Therapeutic equipment acquired: None Reason patient discharged from therapy: Discharge from hospital Pt/family agrees with progress & goals achieved: Yes Date patient discharged from therapy: 04/18/16   Leonette Monarch, LRT/CTRS 04/18/2016, 4:17 PM

## 2016-04-18 NOTE — Plan of Care (Signed)
Problem: Activity: Goal: Sleeping patterns will improve Outcome: Progressing Patient slept for Estimated Hours of 7.45; every 15 minutes safety round maintained, no injury or falls during this shift.    

## 2016-04-18 NOTE — [facility] Group Notes (Signed)
[facility] LCSW Group Therapy  04/18/2016 3:46 PM  Type of Therapy:  Group Therapy  Participation Level:  Patient did not attend group. CSW invited patient to group.   Summary of Progress/Problems: Feelings around Relapse. Group members discussed the meaning of relapse and shared personal stories of relapse, how it affected them and others, and how they perceived themselves during this time. Group members were encouraged to identify triggers, warning signs and coping skills used when facing the possibility of relapse. Social supports were discussed and explored in detail. Patients also discussed facing disappointment and how that can trigger someone to relapse.   [name] G. Garnette CzechSampson MSW, LCSWA 04/18/2016, 3:46 PM

## 2016-04-18 NOTE — Progress Notes (Signed)
Patient ID: Katrina PatteeShirley Jean Woods, female   DOB: [birth year], 56 y.o.   MRN: [id number] Pleasant, polite, appreciative, allowed to vent, 3-Staff-Assist to bed after using bedside commode, Dr. Ardyth HarpsHernandez ordered Trazodone 50 mg qhs for difficulty sleeping, Tylenol 650 mg PO PRN given for minor discomfort. Pt denied SI/HI, denied AV/H.

## 2016-04-18 NOTE — Progress Notes (Signed)
Patient denies SI/HI, denies A/V hallucinations. Patient verbalizes understanding of discharge instructions, follow up care and prescriptions. Patient given all belongings from  [employer].Assisted patient to her personal scooter. Patient escorted out by staff, transported by family.

## 2016-04-18 NOTE — Progress Notes (Signed)
  [facility]  Will you be returning to the same living situation after discharge:  Yes,  home. At discharge, do you have transportation home?: Yes,  family will pick pt up. Do you have the ability to pay for your medications: Yes,  [employer] insurance.  Release of information consent forms completed and in the chart;  Patient's signature needed at discharge.  Patient to Follow up at: Follow-up Information    [name],LPC. Go on 04/21/2016.   Why:  Please meet with your therapist, Blima SingerCara Vise on March 5th for your weekly therapy sessions. She asks that you call to schedule a time that works best for you. If you have any questions or concerns, call Blima Singerara Kensinger directly. Bring discharge papers! Contact information: Address: [address] [place], [place] [address] Phone: [phone] No fax at office, asks for info. to be mailed           Next level of care provider has [facility] Link:no  Safety Planning and Suicide Prevention discussed: Yes,  SPE completed with father and patient.  Have you used any form of tobacco in the last 30 days? (Cigarettes, Smokeless Tobacco, Cigars, and/or Pipes): No  Has patient been referred to the Quitline?: N/A patient is not a smoker  Patient has been referred for addiction treatment: N/A  Lynden OxfordKadijah R Jocie Moore, MSW, LCSW-A 04/18/2016, 10:27 AM

## 2016-04-18 NOTE — Tx Team (Signed)
Interdisciplinary Treatment and Diagnostic Plan Update  04/18/2016 Time of Session: 11:00 AM [name] MRN: [id number]  Principal Diagnosis: Severe recurrent major depression without psychotic features [facility])  Secondary Diagnoses: Principal Problem:   Severe recurrent major depression without psychotic features ([place]) Active Problems:   HYPERCHOLESTEROLEMIA   MUSCULAR DYSTROPHY   OVERFLOW INCONTINENCE   Hypothyroidism   Current Medications:  Current Facility-Administered Medications  Medication Dose Route Frequency Provider Last Rate Last Dose  . acetaminophen (TYLENOL) tablet 650 mg  650 mg Oral Q6H PRN Gonzella Lex, MD   650 mg at 04/17/16 2104  . alum & mag hydroxide-simeth (MAALOX/MYLANTA) 200-200-20 MG/5ML suspension 30 mL  30 mL Oral Q4H PRN Gonzella Lex, MD      . levothyroxine (SYNTHROID, LEVOTHROID) tablet 112 mcg  112 mcg Oral QAC breakfast Gonzella Lex, MD   112 mcg at 04/18/16 [id number]  . oxybutynin (DITROPAN) tablet 5 mg  5 mg Oral BID Gonzella Lex, MD   5 mg at 04/17/16 1735  . traZODone (DESYREL) tablet 50 mg  50 mg Oral QHS Hildred Priest, MD   50 mg at 04/17/16 2104  . venlafaxine XR (EFFEXOR-XR) 24 hr capsule 150 mg  150 mg Oral Daily Gonzella Lex, MD   150 mg at 04/17/16 0902   PTA Medications: Prescriptions Prior to Admission  Medication Sig Dispense Refill Last Dose  . Coenzyme Q10 (CO Q-10) 100 MG CAPS Take 1 tablet by mouth daily.   unknown at unknown  . Cyanocobalamin (VITAMIN B 12 PO) Take by mouth daily.   Not Taking at Unknown time  . IODINE, KELP, PO Take 1 tablet by mouth daily.   unknown at unknown  . levothyroxine (SYNTHROID, LEVOTHROID) 112 MCG tablet Take 112 mcg by mouth daily before breakfast.   unknown at unknown  . MAGNESIUM PO Take 1 tablet by mouth daily.   unknown at unknown  . NATURE-THROID 97.5 MG TABS Take 1 tablet by mouth daily.  2 Not Taking at Unknown time  . oxybutynin (DITROPAN) 5 MG tablet Take 1 tablet (5 mg  total) by mouth 2 (two) times daily. 180 tablet 3 unknown at unknown  . PROBIOTIC CAPS Take by mouth daily.     Not Taking at Unknown time  . UNABLE TO FIND selium daily   unknown at unknown  . venlafaxine XR (EFFEXOR-XR) 75 MG 24 hr capsule TAKE ONE CAPSULE BY MOUTH EVERY DAY (Patient taking differently: TAKE 2 caps po qd) 90 capsule 3 unknown at unknown  . vitamin E 400 UNIT capsule Take 400 Units by mouth daily.   unknown at unknown  . Zinc 100 MG TABS Take by mouth.   unknown at unknown    Patient Stressors: Financial difficulties Health problems Marital or family conflict Medication change or noncompliance  Patient Strengths: Ability for insight Active sense of humor Average or above average intelligence Capable of independent living Communication skills Supportive family/friends  Treatment Modalities: Medication Management, Group therapy, Case management,  1 to 1 session with clinician, Psychoeducation, Recreational therapy.   Physician Treatment Plan for Primary Diagnosis: Severe recurrent major depression without psychotic features ([place]) Long Term Goal(s): Improvement in symptoms so as ready for discharge Improvement in symptoms so as ready for discharge   Short Term Goals: Ability to verbalize feelings will improve Ability to disclose and discuss suicidal ideas Ability to identify and develop effective coping behaviors will improve Ability to identify changes in lifestyle to reduce recurrence of condition will  improve Ability to identify triggers associated with substance abuse/mental health issues will improve  Medication Management: Evaluate patient's response, side effects, and tolerance of medication regimen.  Therapeutic Interventions: 1 to 1 sessions, Unit Group sessions and Medication administration.  Evaluation of Outcomes: Adequate for discharge.  Physician Treatment Plan for Secondary Diagnosis: Principal Problem:   Severe recurrent major depression without  psychotic features ([place]) Active Problems:   HYPERCHOLESTEROLEMIA   MUSCULAR DYSTROPHY   OVERFLOW INCONTINENCE   Hypothyroidism  Long Term Goal(s): Improvement in symptoms so as ready for discharge Improvement in symptoms so as ready for discharge   Short Term Goals: Ability to verbalize feelings will improve Ability to disclose and discuss suicidal ideas Ability to identify and develop effective coping behaviors will improve Ability to identify changes in lifestyle to reduce recurrence of condition will improve Ability to identify triggers associated with substance abuse/mental health issues will improve     Medication Management: Evaluate patient's response, side effects, and tolerance of medication regimen.  Therapeutic Interventions: 1 to 1 sessions, Unit Group sessions and Medication administration.  Evaluation of Outcomes: Adequate for Discharge   RN Treatment Plan for Primary Diagnosis: Severe recurrent major depression without psychotic features ([place]) Long Term Goal(s): Knowledge of disease and therapeutic regimen to maintain health will improve  Short Term Goals: Ability to demonstrate self-control and Ability to verbalize feelings will improve  Medication Management: RN will administer medications as ordered by provider, will assess and evaluate patient's response and provide education to patient for prescribed medication. RN will report any adverse and/or side effects to prescribing provider.  Therapeutic Interventions: 1 on 1 counseling sessions, Psychoeducation, Medication administration, Evaluate responses to treatment, Monitor vital signs and CBGs as ordered, Perform/monitor CIWA, COWS, AIMS and Fall Risk screenings as ordered, Perform wound care treatments as ordered.  Evaluation of Outcomes: Progressing   LCSW Treatment Plan for Primary Diagnosis: Severe recurrent major depression without psychotic features ([place]) Long Term Goal(s): Safe transition to appropriate  next level of care at discharge, Engage patient in therapeutic group addressing interpersonal concerns.  Short Term Goals: Engage patient in aftercare planning with referrals and resources, Increase social support and Increase emotional regulation  Therapeutic Interventions: Assess for all discharge needs, 1 to 1 time with Social worker, Explore available resources and support systems, Assess for adequacy in community support network, Educate family and significant other(s) on suicide prevention, Complete Psychosocial Assessment, Interpersonal group therapy.  Evaluation of Outcomes: Progressing    Recreational Therapy Treatment Plan for Primary Diagnosis: Severe recurrent major depression without psychotic features ([place]) Long Term Goal(s): Interest or engagement in recreation therapeutic interventions will improve  Short Term Goals: Increase stress management skills  Treatment Modalities: Group Therapy and Individual Treatment Sessions  Therapeutic Interventions: Psychoeducation  Evaluation of Outcomes: Met   Progress in Treatment: Attending groups: Yes. Participating in groups: Yes. Taking medication as prescribed: Yes. Toleration medication: Yes. Family/Significant other contact made: Yes, individual(s) contacted:  CSW spoke with father. Patient understands diagnosis: Yes. Discussing patient identified problems/goals with staff: Yes. Medical problems stabilized or resolved: Yes. Denies suicidal/homicidal ideation: Yes. Issues/concerns per patient self-inventory: No.  New problem(s) identified: No, Describe:  None identified.  New Short Term/Long Term Goal(s): Pt's goal is to discharge home and practice coping mechanisms.  Discharge Plan or Barriers: Pt will discharge home and continue therapy.  Reason for Continuation of Hospitalization: Anxiety Depression Suicidal ideation  Estimated Length of Stay: D/C 04/18/2016  Attendees: Patient: [name]  04/18/2016 8:26 AM   Physician:  Dr. Merlyn Albert, MD  04/18/2016 8:26 AM  Nursing: Elige Radon, BSN, RN 04/18/2016 8:26 AM  RN Care Manager: 04/18/2016 8:26 AM  Social Worker: Glorious Peach, MSW, LCSW-A 04/18/2016 8:26 AM  Recreational Therapist: Drue Flirt, LRT/CTRS  04/18/2016 8:26 AM   Scribe for Treatment Team: Emilie Rutter, [place] 04/18/2016 8:26 AM

## 2016-07-23 DIAGNOSIS — F411 Generalized anxiety disorder: Secondary | ICD-10-CM | POA: Insufficient documentation

## 2016-10-22 ENCOUNTER — Other Ambulatory Visit: Payer: Self-pay | Admitting: Family Medicine

## 2016-10-22 DIAGNOSIS — Z1231 Encounter for screening mammogram for malignant neoplasm of breast: Secondary | ICD-10-CM

## 2016-11-12 ENCOUNTER — Ambulatory Visit: Payer: Self-pay

## 2016-12-08 ENCOUNTER — Ambulatory Visit
Admission: EM | Admit: 2016-12-08 | Discharge: 2016-12-08 | Disposition: A | Discharge status: Home / Self Care | Payer: BLUE CROSS/BLUE SHIELD | Attending: Family Medicine | Admitting: Family Medicine

## 2016-12-08 DIAGNOSIS — M26621 Arthralgia of right temporomandibular joint: Secondary | ICD-10-CM

## 2016-12-08 MED ORDER — CYCLOBENZAPRINE HCL 10 MG PO TABS: 10.0000 mg | ORAL_TABLET | Freq: Every evening | ORAL | 0 refills | Status: DC | PRN

## 2016-12-08 MED ORDER — MELOXICAM 15 MG PO TABS: 15.0000 mg | ORAL_TABLET | Freq: Every day | ORAL | 0 refills | Status: DC | PRN

## 2016-12-08 NOTE — ED Provider Notes (Signed)
[facility] ____________________________________________  Time seen: Approximately 3:44 PM  I have reviewed the triage vital signs and the nursing notes.   HISTORY  Chief Complaint No chief complaint on file.   HPI [name] is a 56 y.o. female present for evaluation of right ear pain that has improved last 2 weeks. States right ear pain not so much ear but more so area directly in front of the ear. States gradually worsened over the first few days and then has remained constant. States just prior to onset she had had dental work performed in the next day had some pain to the area. States has been taken some intermittent over-the-counter Tylenol without resolution. Reports continues to eat and drink well. Denies any drainage. States hasn't seen the dentist and per patient patient did not have any further concerns of dental infection. Reports otherwise feels well. Denies other aggravating or alleviating factors. Denies chest pain, shortness of breath, abdominal pain, or rash. Denies recent sickness. Denies recent antibiotic use.   Titus Mould, NP: PCP   Past Medical History:  Diagnosis Date  . Depression   . FSHD (facioscapulohumeral muscular dystrophy)   . Hashimoto's disease   . Heart murmur   . Hyperthyroidism   . Muscular dystrophy   . Thyroid disease   . Urinary incontinence   . Vitamin D deficiency     Patient Active Problem List   Diagnosis Date Noted  . Severe recurrent major depression without psychotic features ([facility]) 04/16/2016  . Hypothyroidism 04/16/2016  . OVERFLOW INCONTINENCE 02/21/2009  . HYPERCHOLESTEROLEMIA 01/07/2008  . MUSCULAR DYSTROPHY 01/07/2008  . CARDIAC MURMUR, BENIGN 01/07/2008    Past Surgical History:  Procedure Laterality Date  . CESAREAN SECTION    . TOTAL KNEE ARTHROPLASTY Right   . TUBAL LIGATION  1996   Bilateral     No current facility-administered medications for this encounter.   Current  Outpatient Prescriptions:  .  cyclobenzaprine (FLEXERIL) 10 MG tablet, Take 1 tablet (10 mg total) by mouth at bedtime as needed (pain). Do not drive while taking as can cause drowsiness, Disp: 15 tablet, Rfl: 0 .  levothyroxine (SYNTHROID, LEVOTHROID) 112 MCG tablet, Take 112 mcg by mouth daily before breakfast., Disp: , Rfl:  .  meloxicam (MOBIC) 15 MG tablet, Take 1 tablet (15 mg total) by mouth daily as needed., Disp: 15 tablet, Rfl: 0 .  oxybutynin (DITROPAN) 5 MG tablet, Take 1 tablet (5 mg total) by mouth 2 (two) times daily., Disp: 180 tablet, Rfl: 3 .  traZODone (DESYREL) 50 MG tablet, Take 1 tablet (50 mg total) by mouth at bedtime., Disp: 45 tablet, Rfl: 0 .  venlafaxine XR (EFFEXOR-XR) 150 MG 24 hr capsule, Take 2 capsules (300 mg total) by mouth daily with breakfast., Disp: 60 capsule, Rfl: 0  Allergies Patient has no known allergies.  Family History  Problem Relation Age of Onset  . Muscular dystrophy Mother   . Stroke Mother   . Cancer Mother        uterine   . Hyperlipidemia Father   . Muscular dystrophy Sister   . Heart failure Sister   . Aneurysm Brother   . Breast cancer Neg Hx   . Colon cancer Neg Hx   . Ovarian cancer Neg Hx     Social History Social History  Substance Use Topics  . Smoking status: Never Smoker  . Smokeless tobacco: Never Used  . Alcohol use No    Review of Systems Constitutional: No  fever/chills ENT: No sore throat. As above. Cardiovascular: Denies chest pain. Respiratory: Denies shortness of breath. Gastrointestinal: No abdominal pain.   Musculoskeletal: Negative for back pain. Skin: Negative for rash.  ____________________________________________   PHYSICAL EXAM:  VITAL SIGNS: ED Triage Vitals  Enc Vitals Group     BP 12/08/16 1417 122/77     Pulse Rate 12/08/16 1417 95     Resp 12/08/16 1417 20     Temp 12/08/16 1417 98.2 F (36.8 C)     Temp Source 12/08/16 1417 Oral     SpO2 12/08/16 1417 98 %     Weight 12/08/16  1418 200 lb (90.7 kg)     Height 12/08/16 1418 5\' 5"  (1.651 m)     Head Circumference --      Peak Flow --      Pain Score 12/08/16 1418 10     Pain Loc --      Pain Edu? --      Excl. in GC? --     Constitutional: Alert and oriented. Well appearing and in no acute distress. Eyes: Conjunctivae are normal.  ENT      Head: Normocephalic and atraumatic. No left TMJ tenderness. Right TMJ positive mild click with opening, mild to moderate tenderness to direct palpation, full range of motion present.      Ears: Nontender, no erythema, normal TMs bilaterally. No mastoid tenderness bilaterally.       Nose: No congestion/rhinnorhea.      Mouth/Throat: Mucous membranes are moist.Oropharynx non-erythematous. Neck: No stridor. Supple without meningismus.  Hematological/Lymphatic/Immunilogical: No cervical lymphadenopathy. Cardiovascular: Normal rate, regular rhythm. Grossly normal heart sounds.  Good peripheral circulation. Respiratory: Normal respiratory effort without tachypnea nor retractions. Breath sounds are clear and equal bilaterally. No wheezes, rales, rhonchi. Musculoskeletal: No midline cervical, thoracic or lumbar tenderness to palpation.   Neurologic:  Normal speech and language.  Speech is normal. No gait instability.  Skin:  Skin is warm, dry Psychiatric: Mood and affect are normal. Speech and behavior are normal. Patient exhibits appropriate insight and judgment   ___________________________________________   LABS (all labs ordered are listed, but only abnormal results are displayed)  Labs Reviewed - No data to display  RADIOLOGY  No results found. ____________________________________________   PROCEDURES Procedures    INITIAL IMPRESSION / ASSESSMENT AND PLAN / ED COURSE  Pertinent labs & imaging results that were available during my care of the patient were reviewed by me and considered in my medical decision making (see chart for details).  Well appearing  patient. No acute distress. Moderate right TMJ tenderness and pain, full range of motion present. No signs of infection. States that right TMJ pain. Will treat patient with oral mother and when necessary Flexeril at night as needed. Denies grinding teeth. Encourage rest, fluids and supportive care.   Discussed follow up with Primary care physician this week. Discussed follow up and return parameters including no resolution or any worsening concerns. Patient verbalized understanding and agreed to plan.   ____________________________________________   FINAL CLINICAL IMPRESSION(S) / ED DIAGNOSES  Final diagnoses:  Arthralgia of right temporomandibular joint     Discharge Medication List as of 12/08/2016  2:46 PM    START taking these medications   Details  cyclobenzaprine (FLEXERIL) 10 MG tablet Take 1 tablet (10 mg total) by mouth at bedtime as needed (pain). Do not drive while taking as can cause drowsiness, Starting Mon 12/08/2016, Normal    meloxicam (MOBIC) 15 MG tablet Take 1 tablet (  15 mg total) by mouth daily as needed., Starting Mon 12/08/2016, Normal        Note: This dictation was prepared with Dragon dictation along with smaller phrase technology. Any transcriptional errors that result from this process are unintentional.         Renford DillsMiller, Nancie Bocanegra, NP 12/08/16 1555

## 2016-12-08 NOTE — ED Triage Notes (Signed)
Pt behind right ear and hurts when she opens her jaw. Sx started 2 weeks ago. Feels pressure behind ear

## 2016-12-08 NOTE — Discharge Instructions (Signed)
Take medication as prescribed. Rest. Drink plenty of fluids.   Follow up with your primary care physician and dentist this week as needed. Return to Urgent care for new or worsening concerns.

## 2019-06-16 ENCOUNTER — Other Ambulatory Visit: Payer: Self-pay

## 2019-06-16 ENCOUNTER — Ambulatory Visit
Admission: EM | Admit: 2019-06-16 | Discharge: 2019-06-16 | Disposition: A | Discharge status: Home / Self Care | Payer: Medicare HMO

## 2019-06-16 DIAGNOSIS — K047 Periapical abscess without sinus: Secondary | ICD-10-CM | POA: Diagnosis not present

## 2019-06-16 DIAGNOSIS — K068 Other specified disorders of gingiva and edentulous alveolar ridge: Secondary | ICD-10-CM

## 2019-06-16 DIAGNOSIS — R220 Localized swelling, mass and lump, head: Secondary | ICD-10-CM

## 2019-06-16 MED ORDER — AMOXICILLIN 500 MG PO CAPS: 500.0000 mg | ORAL_CAPSULE | Freq: Four times a day (QID) | ORAL | 0 refills | Status: AC

## 2019-06-16 MED ORDER — CHLORHEXIDINE GLUCONATE 0.12 % MT SOLN: 15.0000 mL | Freq: Two times a day (BID) | OROMUCOSAL | 0 refills | Status: DC

## 2019-06-16 NOTE — ED Triage Notes (Signed)
Pt states her gums have been swollen since last summer. Pain much worse past two days.

## 2019-06-16 NOTE — Discharge Instructions (Signed)
It was very nice seeing you today in clinic. Thank you for entrusting me with your care.   Take medications as prescribed. Monitor temperature. Call dentist if pain worsens or you develop any fever.  Make arrangements to follow up with your regular regular dentist. If your symptoms/condition worsens, please seek follow up care either here or in the ER. Please remember, our [facility] providers are "right here with you" when you need [place].   Again, it was my pleasure to take care of you today. Thank you for choosing our clinic. I hope that you start to feel better quickly.   Quentin Mulling, MSN, APRN, FNP-C, CEN Advanced Practice Provider [facility]

## 2019-06-16 NOTE — ED Provider Notes (Signed)
[place], [place]   Name: [name] DOB: 1960/04/26 MRN: [id number] CSN: [id number] PCP: Titus Mould, NP  Arrival date and time:  06/16/19 1620  Chief Complaint:  Oral Swelling  NOTE: Prior to seeing the patient today, I have reviewed the triage nursing documentation and vital signs. Clinical staff has updated patient's PMH/PSHx, current medication list, and drug allergies/intolerances to ensure comprehensive history available to assist in medical decision making.   History:   HPI: [name] is a 59 y.o. female who presents today with complaints of dental pain. Patient complains of gingival swelling and pain x 1 year. She reports that she frequently visits the dentist for sub-gingival plaque removal and root planing. She was last seen by her dental provider in 12/2018. Patient notes that over the course of the last 3 days, her lower anterior gums have been swollen, painful, and bleeding. She has appreciated some mental and sub-mental swelling. Patient denies fever, chills, or other systemic signs of infection; no hypotension, nausea, vomiting, or vertiginous symptoms. Of note, patient has a a PMH (+) for MD and is wheelchair bound. Due to her chronic condition, patient has issues with proper oral hygiene, thus leading to the aforementioned recurrent issue. Despite her symptoms, patient has not taken any over the counter interventions to help improve/relieve her reported symptoms at home.   Past Medical History:  Diagnosis Date  . Depression   . FSHD (facioscapulohumeral muscular dystrophy) (HCC)   . Hashimoto's disease   . Heart murmur   . Hyperthyroidism   . Muscular dystrophy (HCC)   . Thyroid disease   . Urinary incontinence   . Vitamin D deficiency     Past Surgical History:  Procedure Laterality Date  . CESAREAN SECTION    . TOTAL KNEE ARTHROPLASTY Right   . TUBAL LIGATION  1996   Bilateral    Family History  Problem Relation Age of Onset  .  Muscular dystrophy Mother   . Stroke Mother   . Cancer Mother        uterine   . Hyperlipidemia Father   . Muscular dystrophy Sister   . Heart failure Sister   . Aneurysm Brother   . Breast cancer Neg Hx   . Colon cancer Neg Hx   . Ovarian cancer Neg Hx     Social History   Tobacco Use  . Smoking status: Never Smoker  . Smokeless tobacco: Never Used  Substance Use Topics  . Alcohol use: Yes    Comment: social  . Drug use: No    Patient Active Problem List   Diagnosis Date Noted  . Severe recurrent major depression without psychotic features (HCC) 04/16/2016  . Hypothyroidism 04/16/2016  . OVERFLOW INCONTINENCE 02/21/2009  . HYPERCHOLESTEROLEMIA 01/07/2008  . MUSCULAR DYSTROPHY 01/07/2008  . CARDIAC MURMUR, BENIGN 01/07/2008    Home Medications:    Current Meds  Medication Sig  . busPIRone (BUSPAR) 7.5 MG tablet Take by mouth.  . DULoxetine (CYMBALTA) 20 MG capsule Take by mouth.    Allergies:   Patient has no known allergies.  Review of Systems (ROS):  Review of systems NEGATIVE unless otherwise noted in narrative H&P section.   Vital Signs: Today's Vitals   06/16/19 1642 06/16/19 1643 06/16/19 1709  BP: (!) 128/104    Pulse: (!) 112    Resp: 17    Temp: 98.1 F (36.7 C)    TempSrc: Oral    SpO2: 96%    Weight:  220 lb (  99.8 kg)   Height:  5\' 5"  (1.651 m)   PainSc:  10-Worst pain ever 9     Physical Exam: Physical Exam  Constitutional: She is oriented to person, place, and time and well-developed, well-nourished, and in no distress.  HENT:  Head: Normocephalic and atraumatic.  Mouth/Throat: Uvula is midline, oropharynx is clear and moist and mucous membranes are normal. Abnormal dentition.    (+) poor dentition overall.   Eyes: Pupils are equal, round, and reactive to light.  Cardiovascular: Regular rhythm and intact distal pulses. Tachycardia present.  Murmur heard. Pulmonary/Chest: Effort normal and breath sounds normal.  Neurological:  She is alert and oriented to person, place, and time.  Wheelchair bound 2/2 MD diagnosis.  Skin: Skin is warm and dry. No rash noted. She is not diaphoretic.  Psychiatric: Mood, memory, affect and judgment normal.  Nursing note and vitals reviewed.   Urgent Care Treatments / Results:   No orders of the defined types were placed in this encounter.   LABS: PLEASE NOTE: all labs that were ordered this encounter are listed, however only abnormal results are displayed. Labs Reviewed - No data to display  EKG: -None  RADIOLOGY: No results found.  PROCEDURES: Procedures  MEDICATIONS RECEIVED THIS VISIT: Medications - No data to display  PERTINENT CLINICAL COURSE NOTES/UPDATES:   Initial Impression / Assessment and Plan / Urgent Care Course:  Pertinent labs & imaging results that were available during my care of the patient were personally reviewed by me and considered in my medical decision making (see lab/imaging section of note for values and interpretations).  [name] is a 59 y.o. female who presents to [facility] today with complaints of Oral Swelling  Patient is well appearing overall in clinic today. She does not appear to be in any acute distress. Presenting symptoms (see HPI) and exam as documented above. Exam consistent with acute dental infection. (+) swelling to lower anterior gums, mental, and sub-mental areas. No discreet abscess, however there is the possibility with the amount of swelling present. Suspect related to chronic gingivitis. Needs to see dentistry for routine dental care and repeat root planing. Will treat acute infection with a 7 day course of amoxicillin and CHG 0.12% mouth wash. Discussed supportive care and pain control. Patient wishes to use APAP and IBU for pain at this time.   I have reviewed the follow up and strict return precautions for any new or worsening symptoms. Patient is aware of symptoms that would be deemed urgent/emergent,  and would thus require further evaluation either here or in the emergency department. At the time of discharge, she verbalized understanding and consent with the discharge plan as it was reviewed with her. All questions were fielded by provider and/or clinic staff prior to patient discharge.    Final Clinical Impressions / Urgent Care Diagnoses:   Final diagnoses:  Dental infection  Pain in gums  Swelling of gums    New Prescriptions:  [place] Controlled Substance Registry consulted? Not Applicable  Meds ordered this encounter  Medications  . amoxicillin (AMOXIL) 500 MG capsule    Sig: Take 1 capsule (500 mg total) by mouth 4 (four) times daily for 7 days.    Dispense:  28 capsule    Refill:  0  . chlorhexidine (PERIDEX) 0.12 % solution    Sig: Use as directed 15 mLs in the mouth or throat 2 (two) times daily.    Dispense:  120 mL    Refill:  0    Recommended Follow up Care:  Patient encouraged to follow up with the following provider within the specified time frame, or sooner as dictated by the severity of her symptoms. As always, she was instructed that for any urgent/emergent care needs, she should seek care either here or in the emergency department for more immediate evaluation.  Follow-up Information    Call  Your dentist.   Why: Make them aware of urgent care visit. Need to be seen for evaluation.        NOTE: This note was prepared using Scientist, clinical (histocompatibility and immunogenetics) along with smaller Lobbyist. Despite my best ability to proofread, there is the potential that transcriptional errors may still occur from this process, and are completely unintentional.    Verlee Monte, NP 06/16/19 2127

## 2019-07-11 ENCOUNTER — Other Ambulatory Visit: Payer: Self-pay

## 2019-07-11 ENCOUNTER — Emergency Department
Admission: EM | Admit: 2019-07-11 | Discharge: 2019-07-13 | Disposition: A | Discharge status: Home / Self Care | Payer: Medicare HMO | Attending: Emergency Medicine | Admitting: Emergency Medicine

## 2019-07-11 DIAGNOSIS — Z20822 Contact with and (suspected) exposure to covid-19: Secondary | ICD-10-CM | POA: Insufficient documentation

## 2019-07-11 DIAGNOSIS — E039 Hypothyroidism, unspecified: Secondary | ICD-10-CM | POA: Diagnosis not present

## 2019-07-11 DIAGNOSIS — G7100 Muscular dystrophy, unspecified: Secondary | ICD-10-CM | POA: Insufficient documentation

## 2019-07-11 DIAGNOSIS — Z96651 Presence of right artificial knee joint: Secondary | ICD-10-CM | POA: Insufficient documentation

## 2019-07-11 DIAGNOSIS — R531 Weakness: Secondary | ICD-10-CM | POA: Diagnosis present

## 2019-07-11 DIAGNOSIS — Z79899 Other long term (current) drug therapy: Secondary | ICD-10-CM | POA: Insufficient documentation

## 2019-07-11 LAB — CBC
HCT: 41.8 % (ref 36.0–46.0)
Hemoglobin: 13.6 g/dL (ref 12.0–15.0)
MCH: 30.5 pg (ref 26.0–34.0)
MCHC: 32.5 g/dL (ref 30.0–36.0)
MCV: 93.7 fL (ref 80.0–100.0)
Platelets: 323 10*3/uL (ref 150–400)
RBC: 4.46 MIL/uL (ref 3.87–5.11)
RDW: 13 % (ref 11.5–15.5)
WBC: 5.5 10*3/uL (ref 4.0–10.5)
nRBC: 0 % (ref 0.0–0.2)

## 2019-07-11 LAB — BASIC METABOLIC PANEL
Anion gap: 7 (ref 5–15)
BUN: 9 mg/dL (ref 6–20)
CO2: 29 mmol/L (ref 22–32)
Calcium: 9.2 mg/dL (ref 8.9–10.3)
Chloride: 102 mmol/L (ref 98–111)
Creatinine, Ser: <0.3 mg/dL — ABNORMAL LOW (ref 0.44–1.00)
Glucose, Bld: 101 mg/dL — ABNORMAL HIGH (ref 70–99)
Potassium: 4.4 mmol/L (ref 3.5–5.1)
Sodium: 138 mmol/L (ref 135–145)

## 2019-07-11 MED ORDER — OXYBUTYNIN CHLORIDE 5 MG PO TABS
5.0000 mg | ORAL_TABLET | Freq: Two times a day (BID) | ORAL | Status: DC
  Administered 2019-07-12 (×2): 5 mg via ORAL
  Filled 2019-07-11 (×4): qty 1

## 2019-07-11 MED ORDER — LACTATED RINGERS IV BOLUS
1000.0000 mL | Freq: Once | INTRAVENOUS | Status: AC
  Administered 2019-07-12: 1000 mL via INTRAVENOUS

## 2019-07-11 MED ORDER — VENLAFAXINE HCL ER 75 MG PO CP24
75.0000 mg | ORAL_CAPSULE | Freq: Every day | ORAL | Status: DC
  Administered 2019-07-12: 75 mg via ORAL
  Filled 2019-07-11 (×2): qty 1

## 2019-07-11 MED ORDER — SODIUM CHLORIDE 0.9% FLUSH: 3.0000 mL | Freq: Once | INTRAVENOUS | Status: DC

## 2019-07-11 MED ORDER — BUSPIRONE HCL 5 MG PO TABS
7.5000 mg | ORAL_TABLET | Freq: Two times a day (BID) | ORAL | Status: DC
  Administered 2019-07-12 (×3): 7.5 mg via ORAL
  Filled 2019-07-11 (×3): qty 2

## 2019-07-11 MED ORDER — LEVOTHYROXINE SODIUM 112 MCG PO TABS
112.0000 ug | ORAL_TABLET | Freq: Every day | ORAL | Status: DC
  Administered 2019-07-12 – 2019-07-13 (×2): 112 ug via ORAL
  Filled 2019-07-11 (×3): qty 1

## 2019-07-11 MED ORDER — DULOXETINE HCL 20 MG PO CPEP
20.0000 mg | ORAL_CAPSULE | Freq: Every day | ORAL | Status: DC
  Administered 2019-07-12: 20 mg via ORAL
  Filled 2019-07-11 (×2): qty 1

## 2019-07-11 NOTE — ED Notes (Signed)
Pt reports increasing weakness and decreasing overall health since January, with acutely increased weakness as of last night. Pt reports she normally is able to do everything herself, but lately has been having trouble bathing, moving to her bed, and recently eating has also been difficult for her.   Pt reports she has not had anything to eat or drink today.

## 2019-07-11 NOTE — ED Triage Notes (Addendum)
Pt comes into the ED via EMS from home with increased weakness, pt has a hx of MD and normally is able to do for herself but has not been able to get out her recliner since last night.  CBG 104

## 2019-07-12 LAB — URINALYSIS, COMPLETE (UACMP) WITH MICROSCOPIC
Bilirubin Urine: NEGATIVE
Glucose, UA: NEGATIVE mg/dL
Hgb urine dipstick: NEGATIVE
Ketones, ur: NEGATIVE mg/dL
Leukocytes,Ua: NEGATIVE
Nitrite: NEGATIVE
Protein, ur: NEGATIVE mg/dL
Specific Gravity, Urine: 1.003 — ABNORMAL LOW (ref 1.005–1.030)
pH: 7 (ref 5.0–8.0)

## 2019-07-12 LAB — SARS CORONAVIRUS 2 BY RT PCR (HOSPITAL ORDER, PERFORMED IN ~~LOC~~ HOSPITAL LAB): SARS Coronavirus 2: NEGATIVE

## 2019-07-12 LAB — CK: Total CK: 164 U/L (ref 38–234)

## 2019-07-12 MED ORDER — ENSURE ENLIVE PO LIQD
237.0000 mL | Freq: Two times a day (BID) | ORAL | Status: DC
  Administered 2019-07-12 (×2): 237 mL via ORAL

## 2019-07-12 NOTE — TOC Initial Note (Signed)
Transition of Care [facility]) - Initial/Assessment Note    Patient Details  Name: Tomasita Beevers MRN: [id number] Date of Birth: 1960-08-05  Transition of Care [facility]) CM/SW Contact:    [place] Cellar, RN Phone Number: 07/12/2019, 11:34 AM  Clinical Narrative:                 Left voicemail for father requesting callback to discuss potential discharge plans.         Patient Goals and CMS Choice        Expected Discharge Plan and Services                                                Prior Living Arrangements/Services                       Activities of Daily Living      Permission Sought/Granted                  Emotional Assessment              Admission diagnosis:  MS ems Patient Active Problem List   Diagnosis Date Noted  . Severe recurrent major depression without psychotic features (HCC) 04/16/2016  . Hypothyroidism 04/16/2016  . OVERFLOW INCONTINENCE 02/21/2009  . HYPERCHOLESTEROLEMIA 01/07/2008  . MUSCULAR DYSTROPHY 01/07/2008  . CARDIAC MURMUR, BENIGN 01/07/2008   PCP:  Clent Jacks, PA-C Pharmacy:   [facility] [address], [place] - [address] [place] [address] Phone: [phone] Fax: [phone]  [facility] [address], [place] - [address] [place] [address] Phone: [phone] Fax: [phone]     Social Determinants of Health (SDOH) Interventions    Readmission Risk Interventions No flowsheet data found.

## 2019-07-12 NOTE — TOC Progression Note (Signed)
Transition of Care [facility]) - Progression Note    Patient Details  Name: [name] MRN: [id number] Date of Birth: 11-15-1960  Transition of Care [facility]) CM/SW Contact  [place] Cellar, RN Phone Number: 07/12/2019, 4:06 PM  Clinical Narrative:    Call to [facility]-no open beds at this time. Left voicemail with [facility] updating that patient was interested in placement and information had been sent via Epic.    Expected Discharge Plan: Skilled Nursing Facility Barriers to Discharge: English as a second language teacher, Continued Medical Work up  Expected Discharge Plan and Services Expected Discharge Plan: Skilled Nursing Facility     Post Acute Care Choice: Skilled Nursing Facility Living arrangements for the past 2 months: Single Family Home                                       Social Determinants of Health (SDOH) Interventions    Readmission Risk Interventions No flowsheet data found.

## 2019-07-12 NOTE — ED Notes (Signed)
This RN updated by Darl Pikes, RN on POC. Darl Pikes, RN informing this RN that currently pt's insurance is being authorized and may take a couple of days. Darl Pikes, RN will keep this RN and MD updated.

## 2019-07-12 NOTE — TOC Progression Note (Signed)
Transition of Care [facility]) - Progression Note    Patient Details  Name: [name] MRN: [id number] Date of Birth: 06/01/1960  Transition of Care [facility]) CM/SW Contact  [place] Cellar, RN Phone Number: 07/12/2019, 3:30 PM  Clinical Narrative:    Unable to complete PASRR at this time due to technical issue with data. Will continue to attempt to complete.    Expected Discharge Plan: Skilled Nursing Facility Barriers to Discharge: English as a second language teacher, Continued Medical Work up  Expected Discharge Plan and Services Expected Discharge Plan: Skilled Nursing Facility     Post Acute Care Choice: Skilled Nursing Facility Living arrangements for the past 2 months: Single Family Home                                       Social Determinants of Health (SDOH) Interventions    Readmission Risk Interventions No flowsheet data found.

## 2019-07-12 NOTE — ED Notes (Signed)
Pt eating food brought in by family member at this time

## 2019-07-12 NOTE — NC FL2 (Signed)
[place] MEDICAID FL2 LEVEL OF CARE SCREENING TOOL     IDENTIFICATION  Patient Name: [name] Birthdate: 03/20/1960 Sex: female Admission Date (Current Location): 07/11/2019  County and Medicaid Number:  [place]   Facility and Address:  [facility], [address], [place], [place] [address]      Provider Number: [id number]  Attending Physician Name and Address:  No att. providers found  Relative Name and Phone Number:       Current Level of Care: Hospital Recommended Level of Care: Skilled Nursing Facility Prior Approval Number:    Date Approved/Denied:   PASRR Number:    Discharge Plan: SNF    Current Diagnoses: Patient Active Problem List   Diagnosis Date Noted  . Severe recurrent major depression without psychotic features (HCC) 04/16/2016  . Hypothyroidism 04/16/2016  . OVERFLOW INCONTINENCE 02/21/2009  . HYPERCHOLESTEROLEMIA 01/07/2008  . MUSCULAR DYSTROPHY 01/07/2008  . CARDIAC MURMUR, BENIGN 01/07/2008    Orientation RESPIRATION BLADDER Height & Weight     Self, Time, Situation, Place  Normal Continent Weight: 99.8 kg Height:  5' 5" (165.1 cm)  BEHAVIORAL SYMPTOMS/MOOD NEUROLOGICAL BOWEL NUTRITION STATUS      Continent Diet  AMBULATORY STATUS COMMUNICATION OF NEEDS Skin   Extensive Assist Verbally Normal                       Personal Care Assistance Level of Assistance  Bathing, Feeding, Dressing Bathing Assistance: Limited assistance Feeding assistance: Limited assistance Dressing Assistance: Limited assistance     Functional Limitations Info             SPECIAL CARE FACTORS FREQUENCY  PT (By licensed PT), OT (By licensed OT)     PT Frequency: min 5xweek OT Frequency: min 5xweek            Contractures      Additional Factors Info                  Current Medications (07/12/2019):  This is the current hospital active medication list Current Facility-Administered Medications   Medication Dose Route Frequency Provider Last Rate Last Admin  . busPIRone (BUSPAR) tablet 7.5 mg  7.5 mg Oral BID Veronese, [place], MD   7.5 mg at 07/12/19 1028  . DULoxetine (CYMBALTA) DR capsule 20 mg  20 mg Oral QHS Veronese, [place], MD      . feeding supplement (ENSURE ENLIVE) (ENSURE ENLIVE) liquid 237 mL  237 mL Oral BID BM Isaacs, [place], MD   237 mL at 07/12/19 1357  . levothyroxine (SYNTHROID) tablet 112 mcg  112 mcg Oral QAC breakfast Veronese, [place], MD   112 mcg at 07/12/19 0808  . oxybutynin (DITROPAN) tablet 5 mg  5 mg Oral BID Veronese, [place], MD   5 mg at 07/12/19 1029  . sodium chloride flush (NS) 0.9 % injection 3 mL  3 mL Intravenous Once Veronese, , MD      . venlafaxine XR (EFFEXOR-XR) 24 hr capsule 75 mg  75 mg Oral QHS Veronese, , MD       Current Outpatient Medications  Medication Sig Dispense Refill  . busPIRone (BUSPAR) 7.5 MG tablet Take 7.5 mg by mouth 2 (two) times daily.     . DULoxetine (CYMBALTA) 20 MG capsule Take 20 mg by mouth at bedtime.     . levothyroxine (SYNTHROID) 100 MCG tablet Take 112 mcg by mouth daily before breakfast.     . oxybutynin (DITROPAN) 5 MG tablet   Take 1 tablet (5 mg total) by mouth 2 (two) times daily. 180 tablet 3  . venlafaxine XR (EFFEXOR-XR) 75 MG 24 hr capsule Take 75 mg by mouth at bedtime.    . chlorhexidine (PERIDEX) 0.12 % solution Use as directed 15 mLs in the mouth or throat 2 (two) times daily. (Patient not taking: Reported on 07/12/2019) 120 mL 0     Discharge Medications: Please see discharge summary for a list of discharge medications.  Relevant Imaging Results:  Relevant Lab Results:   Additional Information SS# [id number]  [place] Cellar, RN

## 2019-07-12 NOTE — ED Notes (Signed)
Awaiting med verification 

## 2019-07-12 NOTE — ED Notes (Signed)
Pt family at bedside. Pt able to tolerate PO foods at this time. PT given ensure feeding supplement as well. Pt denies any needs at this time. Will continue to monitor.

## 2019-07-12 NOTE — ED Notes (Signed)
Purewick placed on patient.

## 2019-07-12 NOTE — ED Notes (Signed)
Pt repositioned on back at this time

## 2019-07-12 NOTE — TOC Progression Note (Signed)
Transition of Care [facility]) - Progression Note    Patient Details  Name: [name] MRN: [id number] Date of Birth: 09-06-1960  Transition of Care [facility]) CM/SW Contact  Ada Cellar, RN Phone Number: 07/12/2019, 12:46 PM  Clinical Narrative:    Call to [facility], states facility did receive application last week and is reviewing it for placement however patient would be placed on the waiting list which is currently 6 months to 1 year. Facility would be unable to accept patient for STR due to patient needing eventual LTC placement. RN CM will seek alternate placement once PT assessment completed .   Expected Discharge Plan: Skilled Nursing Facility Barriers to Discharge: English as a second language teacher, Continued Medical Work up  Expected Discharge Plan and Services Expected Discharge Plan: Skilled Nursing Facility     Post Acute Care Choice: Skilled Nursing Facility Living arrangements for the past 2 months: Single Family Home                                       Social Determinants of Health (SDOH) Interventions    Readmission Risk Interventions No flowsheet data found.

## 2019-07-12 NOTE — ED Notes (Signed)
Pt rolled onto right side at this time

## 2019-07-12 NOTE — Evaluation (Signed)
Physical Therapy Evaluation Patient Details Name: [name] MRN: [id number] DOB: 22-Dec-1960 Today's Date: 07/12/2019   History of Present Illness  Pt is a 59 y.o. female with a history of muscular dystrophy, Hashimoto's, depression who presents for evaluation of progression of her weakness.   Patient reports that her muscular dystrophy has been progressing since January however acutely worsened since last night.    Clinical Impression  Pt alert oriented x4, behavior WFLs though pt emotional during session. Politely declined chaplain consult at this time. Per pt since January the patient has experienced a decline in functional mobility, especially in the last month acutely. Previously to January pt was able to perform ADLs modI, lateral scoot for transfers, scooter for primary mobility, was able to drive.   The patient exhibited profound weakness of UE and LE. Unable to lift any extremity against gravity; AAROM for all exercises. Pt motivated to participate as much as possible, often stated that stretches/exercises helped her feel better. Long sitting from supine performed with minA for trunk elevation and pt pulling heavily on bilateral gurney rails. Able to maintain balance ~33min with UE support.  Overall the patient demonstrated deficits (see "PT Problem List") that impede the patient's functional abilities, safety, and mobility and would benefit from skilled PT intervention. Recommendation is SNF due to acute decline in functional status, current level of assistance needed, and decreased caregiver support.     Follow Up Recommendations SNF    Equipment Recommendations  Other (comment)(TBD at next venue of care)    Recommendations for Other Services OT consult     Precautions / Restrictions Precautions Precautions: Fall Restrictions Weight Bearing Restrictions: No      Mobility  Bed Mobility Overal bed mobility: Needs Assistance             General bed mobility  comments: deferred supine to sit due to profound weakness, able to perform long sitting with bilateral rails and minA for trunk elevation  Transfers                    Ambulation/Gait                Stairs            Wheelchair Mobility    Modified Rankin (Stroke Patients Only)       Balance Overall balance assessment: (Pt able to long sit with CGA once positioned, bilateral rail support for UEs)                                           Pertinent Vitals/Pain Pain Assessment: No/denies pain    Home Living Family/patient expects to be discharged to:: Private residence Living Arrangements: Alone(has a roommate downstairs) Available Help at Discharge: Family;Other (Comment)(dad who is 6, friends who check in once in a while) Type of Home: House Home Access: Ramped entrance     [place]: Other (Comment);Able to live on main level with bedroom/bathroom(3 level) Home Equipment: Toilet riser;Electric scooter;Wheelchair - power Additional Comments: transport chair, that lowers and lifts pt in and out of tub. toilet riser (mechanical), adjustable,    Prior Function Level of Independence: Needs assistance   Gait / Transfers Assistance Needed: lateral scoot for transferring  ADL's / Homemaking Assistance Needed: used to dress/bathe butin the last month has not been able to do what sheneeds to do  Comments: Pt stated  she can no longer drive, can no longer get out of tub. Arms are very weak, not able to feed herself. Gradual decline since January, but has worsened acutely in the month     Hand Dominance        Extremity/Trunk Assessment   Upper Extremity Assessment Upper Extremity Assessment: RUE deficits/detail;LUE deficits/detail RUE Deficits / Details: AAROM for all movements, RUE stronger than LUE, unable to initiate movements independently LUE Deficits / Details: AAROM for all movements, LUE weaker than RUE    Lower Extremity  Assessment Lower Extremity Assessment: RLE deficits/detail;LLE deficits/detail;Generalized weakness RLE Deficits / Details: AAROM for all movements unable to move against gravity without assistance LLE Deficits / Details: AAROM for all movements, unable to move against gravity without assistance       Communication      Cognition Arousal/Alertness: Awake/alert Behavior During Therapy: WFL for tasks assessed/performed Overall Cognitive Status: Within Functional Limits for tasks assessed                                 General Comments: Pt emotional during PT visit, but behavior WFLs. Declined potential chaplain consult      General Comments      Exercises General Exercises - Lower Extremity Ankle Circles/Pumps: (~30sec gastroc stretch bilaterally) Quad Sets: AROM;Strengthening;Both;5 reps Heel Slides: AAROM;Strengthening;Both;10 reps Hip ABduction/ADduction: AAROM;Strengthening;Both;10 reps Straight Leg Raises: AAROM;Strengthening;Both;10 reps   Assessment/Plan    PT Assessment Patient needs continued PT services  PT Problem List Decreased strength;Decreased mobility;Decreased activity tolerance;Decreased balance;Decreased range of motion;Decreased safety awareness       PT Treatment Interventions DME instruction;Therapeutic exercise;Balance training;Neuromuscular re-education;Functional mobility training;Therapeutic activities;Patient/family education    PT Goals (Current goals can be found in the Care Plan section)  Acute Rehab PT Goals Patient Stated Goal: to be as independent as possible PT Goal Formulation: With patient Time For Goal Achievement: 07/26/19 Potential to Achieve Goals: Good    Frequency Min 2X/week   Barriers to discharge Decreased caregiver support      Co-evaluation               AM-PAC PT "6 Clicks" Mobility  Outcome Measure Help needed turning from your back to your side while in a flat bed without using bedrails?: A  Lot Help needed moving from lying on your back to sitting on the side of a flat bed without using bedrails?: Total Help needed moving to and from a bed to a chair (including a wheelchair)?: Total Help needed standing up from a chair using your arms (e.g., wheelchair or bedside chair)?: Total Help needed to walk in hospital room?: Total Help needed climbing 3-5 steps with a railing? : Total 6 Click Score: 7    End of Session Equipment Utilized During Treatment: Oxygen Activity Tolerance: Patient limited by fatigue Patient left: in bed;with call bell/phone within reach Nurse Communication: Mobility status PT Visit Diagnosis: Other abnormalities of gait and mobility (R26.89);Difficulty in walking, not elsewhere classified (R26.2);Muscle weakness (generalized) (M62.81);Unsteadiness on feet (R26.81)    Time: 8250-5397 PT Time Calculation (min) (ACUTE ONLY): 18 min   Charges:   PT Evaluation $PT Eval Moderate Complexity: 1 Mod PT Treatments $Therapeutic Exercise: 8-22 mins        Olga Coaster PT, DPT 2:47 PM,07/12/19

## 2019-07-12 NOTE — NC FL2 (Deleted)
[place] LEVEL OF CARE SCREENING TOOL     IDENTIFICATION  Patient Name: [name] Birthdate: 1960-03-19 Sex: female Admission Date (Current Location): 07/11/2019  [facility] and [place] Number:  Engineering geologist and Address:  [facility], [address] [place],  [address]      Provider Number: [id number]  Attending Physician Name and Address:  No att. providers found  Relative Name and Phone Number:       Current Level of Care: Hospital Recommended Level of Care: [place] Prior Approval Number:    Date Approved/Denied:   PASRR Number:    Discharge Plan: SNF    Current Diagnoses: Patient Active Problem List   Diagnosis Date Noted  . Severe recurrent major depression without psychotic features ([place]) 04/16/2016  . Hypothyroidism 04/16/2016  . OVERFLOW INCONTINENCE 02/21/2009  . HYPERCHOLESTEROLEMIA 01/07/2008  . MUSCULAR DYSTROPHY 01/07/2008  . CARDIAC MURMUR, BENIGN 01/07/2008    Orientation RESPIRATION BLADDER Height & Weight     Self, Time, Situation, Place  Normal Continent Weight: 99.8 kg Height:  5\' 5"  (165.1 cm)  BEHAVIORAL SYMPTOMS/MOOD NEUROLOGICAL BOWEL NUTRITION STATUS      Continent Diet  AMBULATORY STATUS COMMUNICATION OF NEEDS Skin   Extensive Assist Verbally Normal                       Personal Care Assistance Level of Assistance  Bathing, Feeding, Dressing Bathing Assistance: Limited assistance Feeding assistance: Limited assistance Dressing Assistance: Limited assistance     Functional Limitations Info             SPECIAL CARE FACTORS FREQUENCY  PT (By licensed PT), OT (By licensed OT)     PT Frequency: min 5xweek OT Frequency: min 5xweek            Contractures      Additional Factors Info                  Current Medications (07/12/2019):  This is the current hospital active medication list Current Facility-Administered Medications   Medication Dose Route Frequency Provider Last Rate Last Admin  . busPIRone (BUSPAR) tablet 7.5 mg  7.5 mg Oral BID Alfred Levins, [place], MD   7.5 mg at 07/12/19 1028  . DULoxetine (CYMBALTA) DR capsule 20 mg  20 mg Oral QHS [place], [place], MD      . feeding supplement (ENSURE ENLIVE) (ENSURE ENLIVE) liquid 237 mL  237 mL Oral BID BM Duffy Bruce, MD   237 mL at 07/12/19 1357  . levothyroxine (SYNTHROID) tablet 112 mcg  112 mcg Oral QAC breakfast Alfred Levins, [place], MD   112 mcg at 07/12/19 [id number]  . oxybutynin (DITROPAN) tablet 5 mg  5 mg Oral BID Alfred Levins, [place], MD   5 mg at 07/12/19 1029  . sodium chloride flush (NS) 0.9 % injection 3 mL  3 mL Intravenous Once Alfred Levins, [place], MD      . venlafaxine XR (EFFEXOR-XR) 24 hr capsule 75 mg  75 mg Oral QHS Rudene Re, MD       Current Outpatient Medications  Medication Sig Dispense Refill  . busPIRone (BUSPAR) 7.5 MG tablet Take 7.5 mg by mouth 2 (two) times daily.     . DULoxetine (CYMBALTA) 20 MG capsule Take 20 mg by mouth at bedtime.     Marland Kitchen levothyroxine (SYNTHROID) 100 MCG tablet Take 112 mcg by mouth daily before breakfast.     . oxybutynin (DITROPAN) 5 MG tablet  Take 1 tablet (5 mg total) by mouth 2 (two) times daily. 180 tablet 3  . venlafaxine XR (EFFEXOR-XR) 75 MG 24 hr capsule Take 75 mg by mouth at bedtime.    . chlorhexidine (PERIDEX) 0.12 % solution Use as directed 15 mLs in the mouth or throat 2 (two) times daily. (Patient not taking: Reported on 07/12/2019) 120 mL 0     Discharge Medications: Please see discharge summary for a list of discharge medications.  Relevant Imaging Results:  Relevant Lab Results:   Additional Information SS# [id number]  [place] Cellar, RN

## 2019-07-12 NOTE — ED Provider Notes (Signed)
[facility] Emergency Department Provider Note  ____________________________________________  Time seen: Approximately 12:34 AM  I have reviewed the triage vital signs and the nursing notes.   HISTORY  Chief Complaint Weakness   HPI [name] is a 59 y.o. female with a history of muscular dystrophy, Hashimoto's, depression who presents for evaluation of progression of her weakness.   Patient reports that her muscular dystrophy has been progressing since January however acutely worsened since last night.  She has been extremely weak and unable to even move in and out of her wheelchair.  Has not been able to eat at home.  Unfortunately she has no home health.  She has been working with her primary care doctor for a skilled nursing facility placement, however has not been able to get that done yet.  She denies cough or fever, chest pain or shortness of breath, abdominal pain, nausea or vomiting, dysuria or hematuria.  Her symptoms are just of generalized weakness.  Past Medical History:  Diagnosis Date  . Depression   . FSHD (facioscapulohumeral muscular dystrophy) (HCC)   . Hashimoto's disease   . Heart murmur   . Hyperthyroidism   . Muscular dystrophy (HCC)   . Thyroid disease   . Urinary incontinence   . Vitamin D deficiency     Patient Active Problem List   Diagnosis Date Noted  . Severe recurrent major depression without psychotic features (HCC) 04/16/2016  . Hypothyroidism 04/16/2016  . OVERFLOW INCONTINENCE 02/21/2009  . HYPERCHOLESTEROLEMIA 01/07/2008  . MUSCULAR DYSTROPHY 01/07/2008  . CARDIAC MURMUR, BENIGN 01/07/2008    Past Surgical History:  Procedure Laterality Date  . CESAREAN SECTION    . TOTAL KNEE ARTHROPLASTY Right   . TUBAL LIGATION  1996   Bilateral    Prior to Admission medications   Medication Sig Start Date End Date Taking? Authorizing Provider  busPIRone (BUSPAR) 7.5 MG tablet Take 7.5 mg by mouth 2 (two)  times daily.  05/16/19 05/15/20 Yes [provider]  DULoxetine (CYMBALTA) 20 MG capsule Take 20 mg by mouth at bedtime.  05/30/19 05/29/20 Yes [provider]  levothyroxine (SYNTHROID) 100 MCG tablet Take 112 mcg by mouth daily before breakfast.    Yes [provider]  oxybutynin (DITROPAN) 5 MG tablet Take 1 tablet (5 mg total) by mouth 2 (two) times daily. 06/18/15  Yes Defrancesco, Prentice Docker, MD  venlafaxine XR (EFFEXOR-XR) 75 MG 24 hr capsule Take 75 mg by mouth at bedtime. 06/28/19  Yes [provider]  chlorhexidine (PERIDEX) 0.12 % solution Use as directed 15 mLs in the mouth or throat 2 (two) times daily. Patient not taking: Reported on 07/12/2019 06/16/19   Verlee Monte, NP  traZODone (DESYREL) 50 MG tablet Take 1 tablet (50 mg total) by mouth at bedtime. 04/18/16 06/16/19  Jimmy Footman, MD    Allergies Patient has no known allergies.  Family History  Problem Relation Age of Onset  . Muscular dystrophy Mother   . Stroke Mother   . Cancer Mother        uterine   . Hyperlipidemia Father   . Muscular dystrophy Sister   . Heart failure Sister   . Aneurysm Brother   . Breast cancer Neg Hx   . Colon cancer Neg Hx   . Ovarian cancer Neg Hx     Social History Social History   Tobacco Use  . Smoking status: Never Smoker  . Smokeless tobacco: Never Used  Substance Use Topics  .  Alcohol use: Yes    Comment: social  . Drug use: No    Review of Systems  Constitutional: Negative for fever. + Generalized weakness Eyes: Negative for visual changes. ENT: Negative for sore throat. Neck: No neck pain  Cardiovascular: Negative for chest pain. Respiratory: Negative for shortness of breath. Gastrointestinal: Negative for abdominal pain, vomiting or diarrhea. Genitourinary: Negative for dysuria. Musculoskeletal: Negative for back pain. Skin: Negative for rash. Neurological: Negative for headaches, weakness or numbness. Psych: No SI or  HI  ____________________________________________   PHYSICAL EXAM:  VITAL SIGNS: ED Triage Vitals  Enc Vitals Group     BP 07/11/19 1601 126/89     Pulse Rate 07/11/19 1601 80     Resp 07/11/19 1601 18     Temp 07/11/19 1601 98.4 F (36.9 C)     Temp Source 07/11/19 1601 Oral     SpO2 07/11/19 1601 95 %     Weight 07/11/19 1557 220 lb (99.8 kg)     Height 07/11/19 1557 5\' 5"  (1.651 m)     Head Circumference --      [place] Flow --      Pain Score 07/11/19 1557 0     Pain Loc --      Pain Edu? --      Excl. in [place]? --     Constitutional: Alert and oriented. Well appearing and in no apparent distress. HEENT:      Head: Normocephalic and atraumatic.         Eyes: Conjunctivae are normal. Sclera is non-icteric.       Mouth/Throat: Mucous membranes are moist.       Neck: Supple with no signs of meningismus. Cardiovascular: Regular rate and rhythm. No murmurs, gallops, or rubs.  Respiratory: Normal respiratory effort. Lungs are clear to auscultation bilaterally. No wheezes, crackles, or rhonchi.  Gastrointestinal: Soft, non tender. Musculoskeletal: No edema, cyanosis, or erythema of extremities. Neurologic: Normal speech and language. Face is symmetric. Moving all extremities. No gross focal neurologic deficits are appreciated. Skin: Skin is warm, dry and intact. No rash noted. Psychiatric: Mood and affect are normal. Speech and behavior are normal.  ____________________________________________   LABS (all labs ordered are listed, but only abnormal results are displayed)  Labs Reviewed  BASIC METABOLIC PANEL - Abnormal; Notable for the following components:      Result Value   Glucose, Bld 101 (*)    Creatinine, Ser <0.30 (*)    All other components within normal limits  URINALYSIS, COMPLETE (UACMP) WITH MICROSCOPIC - Abnormal; Notable for the following components:   Color, Urine STRAW (*)    APPearance CLEAR (*)    Specific Gravity, Urine 1.003 (*)    Bacteria, UA RARE  (*)    All other components within normal limits  SARS CORONAVIRUS 2 BY RT PCR (HOSPITAL ORDER, [facility])  CBC  CK   ____________________________________________  EKG  none  ____________________________________________  RADIOLOGY  none  ____________________________________________   PROCEDURES  Procedure(s) performed:yes .1-3 Lead EKG Interpretation Performed by: Rudene Re, MD Authorized by: Rudene Re, MD     Interpretation: normal     ECG rate assessment: normal     Rhythm: sinus rhythm     Ectopy: none     Critical Care performed:  None ____________________________________________   INITIAL IMPRESSION / ASSESSMENT AND PLAN / ED COURSE  59 y.o. female with a history of muscular dystrophy, Hashimoto's, depression who presents for evaluation of progression of  her weakness.  Patient unable to perform her ADLs, lives alone, has some help from neighbors and friends but no constant home health assistance.  Has been working with her primary care doctor for placement in a skilled nursing facility.  Over the last 24 hours now she has been unable to transition to a from the wheelchair.  No localizing symptoms on history and physical exam.  Vitals are within normal limits.  Labs are stable with no significant electrolyte derangements, hypoglycemia, AKI, anemia, or leukocytosis.  Old medical records reviewed.  History gathered from patient and her friend who was at bedside.  Will give IV fluids for mild dehydration.  Will check UA to rule out UTI.  Will consult social work to try to help expedite patient's placement since she has been unable to perform her ADLs.    _____________________________________________ Please note:  Patient was evaluated in Emergency Department today for the symptoms described in the history of present illness. Patient was evaluated in the context of the global COVID-19 pandemic, which necessitated consideration  that the patient might be at risk for infection with the SARS-CoV-2 virus that causes COVID-19. Institutional protocols and algorithms that pertain to the evaluation of patients at risk for COVID-19 are in a state of rapid change based on information released by regulatory bodies including the CDC and federal and state organizations. These policies and algorithms were followed during the patient's care in the ED.  Some ED evaluations and interventions may be delayed as a result of limited staffing during the pandemic.   [place] Controlled Substance Database was reviewed by me. ____________________________________________   FINAL CLINICAL IMPRESSION(S) / ED DIAGNOSES   Final diagnoses:  Muscular dystrophy (HCC)      NEW MEDICATIONS STARTED DURING THIS VISIT:  ED Discharge Orders    None       Note:  This document was prepared using Dragon voice recognition software and may include unintentional dictation errors.    Don Perking, [place], MD 07/15/19 [phone]

## 2019-07-12 NOTE — TOC Progression Note (Signed)
Transition of Care [facility]) - Progression Note    Patient Details  Name: [name] MRN: [id number] Date of Birth: Oct 06, 1960  Transition of Care [facility]) CM/SW Contact   Cellar, RN Phone Number: 07/12/2019, 2:42 PM  Clinical Narrative:    Spoke with patient and sister, Dennie Bible, updated that [facility] was wait list only and discussed other options for placement. Family prefers to stay in [place] area if possible. Interested in [place], [place] and [facility]. Will begin search once PT assessment completed as patient hopeful for STR prior to LTC placement.    Expected Discharge Plan: Skilled Nursing Facility Barriers to Discharge: English as a second language teacher, Continued Medical Work up  Expected Discharge Plan and Services Expected Discharge Plan: Skilled Nursing Facility     Post Acute Care Choice: Skilled Nursing Facility Living arrangements for the past 2 months: Single Family Home                                       Social Determinants of Health (SDOH) Interventions    Readmission Risk Interventions No flowsheet data found.

## 2019-07-12 NOTE — TOC Initial Note (Addendum)
Transition of Care [facility]) - Initial/Assessment Note    Patient Details  Name: [name] MRN: [id number] Date of Birth: 1960/12/08  [facility]) CM/SW Contact:    [place] Cellar, RN Phone Number: 07/12/2019, 12:30 PM  Clinical Narrative:                 Spoke with patient and patients sister, [name] regarding request for placement at [facility]. Patient has completed application as outpatient prior to ED admission due to decline in ability to complete ADLs. Patient states she is financially able to pay for [facility] as long term resident as that is her desired discharge plan. Patient is planning to sale her home once placed. Sister is at bedside and assisting with care at home as needed. Will await PT recommendations.   Expected Discharge Plan: Skilled Nursing Facility Barriers to Discharge: English as a second language teacher, Continued Medical Work up   Patient Goals and CMS Choice Patient states their goals for this hospitalization and ongoing recovery are:: Get placement into [facility]      Expected Discharge Plan and Services Expected Discharge Plan: Skilled Nursing Facility     Post Acute Care Choice: Skilled Nursing Facility Living arrangements for the past 2 months: Single Family Home                                      Prior Living Arrangements/Services Living arrangements for the past 2 months: Single Family Home Lives with:: Self Patient language and need for interpreter reviewed:: Yes Do you feel safe going back to the place where you live?: No   Needs more assistance at home  Need for Family Participation in Patient Care: Yes (Comment) Care giver support system in place?: Yes (comment) Current home services: Homehealth aide(aide comes weekly for cooking and cleaning) Criminal Activity/Legal Involvement Pertinent to Current Situation/Hospitalization: No - Comment as needed  Activities of Daily Living      Permission Sought/Granted Permission  sought to share information with : Oceanographer granted to share information with : Yes, Verbal Permission Granted  Share Information with NAME: Sue Lush  Permission granted to share info w AGENCY: [place]        Emotional Assessment Appearance:: Appears stated age Attitude/Demeanor/Rapport: Unable to Assess Affect (typically observed): Unable to Assess Orientation: : Oriented to Self, Oriented to Place, Oriented to  Time, Oriented to Situation Alcohol / Substance Use: Never Used Psych Involvement: No (comment)  Admission diagnosis:  MS ems Patient Active Problem List   Diagnosis Date Noted  . Severe recurrent major depression without psychotic features (HCC) 04/16/2016  . Hypothyroidism 04/16/2016  . OVERFLOW INCONTINENCE 02/21/2009  . HYPERCHOLESTEROLEMIA 01/07/2008  . MUSCULAR DYSTROPHY 01/07/2008  . CARDIAC MURMUR, BENIGN 01/07/2008   PCP:  Clent Jacks, PA-C Pharmacy:   [facility] [address] [place] - [address] [place] [address] Phone: [phone] Fax: [phone]  [facility] [address], [place] - [address] [place] [address] Phone: [phone] Fax: [phone]     Social Determinants of Health (SDOH) Interventions    Readmission Risk Interventions No flowsheet data found.

## 2019-07-12 NOTE — ED Notes (Signed)
Pharmacy messaged about missing/unverified meds 

## 2019-07-13 MED ORDER — ACETAMINOPHEN 325 MG PO TABS
650.0000 mg | ORAL_TABLET | Freq: Once | ORAL | Status: AC
  Administered 2019-07-13: 650 mg via ORAL
  Filled 2019-07-13: qty 2

## 2019-07-13 NOTE — Discharge Instructions (Addendum)
Thank you for letting us take care of you in the emergency department today.   Please continue to take any regular, prescribed medications.   Our social work will touch base with you in the next few days regarding resources/agencies.   Please follow up with: Your primary care doctor to review your ER visit and follow up on your symptoms.   Please return to the ER for any new or worsening symptoms.

## 2019-07-13 NOTE — ED Notes (Signed)
Breakfast tray provided, pt assisted with feed, pt repositioned in bed.  NAD noted, pt voices no further needs or c/o at this time.

## 2019-07-13 NOTE — TOC Progression Note (Signed)
Transition of Care [facility]) - Progression Note    Patient Details  Name: Unnamed Hino MRN: [id number] Date of Birth: Jan 11, 1961  [facility] Care [facility]) CM/SW Contact  [place] Cellar, RN Phone Number: 07/13/2019, 10:58 AM  Clinical Narrative:    Left voicemail with Tammy @ [facility] requesting follow up on potential placement. Updated patient will need insurance authorization for SNF.    Expected Discharge Plan: Skilled Nursing Facility Barriers to Discharge: English as a second language teacher, Continued Medical Work up  Expected Discharge Plan and Services Expected Discharge Plan: Skilled Nursing Facility     Post Acute Care Choice: Skilled Nursing Facility Living arrangements for the past 2 months: Single Family Home                                       Social Determinants of Health (SDOH) Interventions    Readmission Risk Interventions No flowsheet data found.

## 2019-07-13 NOTE — TOC Transition Note (Addendum)
Transition of Care [facility]) - CM/SW Discharge Note   Patient Details  Name: [name] MRN: [id number] Date of Birth: [birth year]  [facility] of [facility] CM/SW Contact:  [place] Cellar, RN Phone Number: 07/13/2019, 4:48 PM   Clinical Narrative:    Spoke with patient who states she is not going to [facility] as she had a negative experience there before. Patient is requesting discharge home with home health and states she will pay for private aides as well. RN CM updated ED RN and EDP of patient request for discharge home. Advised patient TOC would outreach to patient with update on what [facility] agency is willing to accept her for services.   [facility] unable to accept patient at this time. Will try other agencies.  [facility] unable to accept patient. [facility] with insurance.  According to patient doctors office sent referral to [facility] a week ago and she has not heard from them. Left message for [place] requesting update on status of patient with [facility].  [place] advised she would check and Statistician.      Barriers to Discharge: English as a second language teacher, Continued Medical Work up   Patient Goals and CMS Choice Patient states their goals for this hospitalization and ongoing recovery are:: Get placement into [facility] and Services     Post Acute Care Choice: Skilled Nursing Facility                               Social Determinants of Health (SDOH) Interventions     Readmission Risk Interventions No flowsheet data found.

## 2019-07-13 NOTE — Evaluation (Signed)
Occupational Therapy Evaluation Patient Details Name: [name] MRN: [id number] DOB: [birth year] Today's Date: 07/13/2019    History of Present Illness Pt is a 59 y.o. female with a history of FSH muscular dystrophy, Hashimoto's, depression who presents for evaluation of progression of her weakness.   Patient reports that her muscular dystrophy has been progressing since January however acutely worsened since last night.   Clinical Impression   [name] was seen for OT evaluation this date. Prior to hospital admission, pt was experiencing progressively more difficulty with self care tasks. She reports that since January she has had increasing difficulty with bathing, grooming, and self-feeding. She also reports that she has stopped driving and become "homeboud" since January. She endorses desire to leave her home, and states that she feels her disease has progressed to the point where she can no longer live alone. Pt intermittently tearful t/o session. Therapeutic use of self utilized t/o session to provide active listening and encouragement. Pt endorses desire to maintain what independence she has and states she would like to be able to independently feed herself again.  Currently pt demonstrates impairments as described below (See OT problem list) which functionally limit her ability to perform ADL/self-care tasks. Pt currently requires MIN A for seated/bed level self-feeding/grooming tasks. MAX assist to perform LB ADL management.  Pt would benefit from skilled OT services to address noted impairments and functional limitations (see below for any additional details) in order to maximize safety and independence while minimizing falls risk and caregiver burden. Upon hospital discharge, recommend STR to maximize pt safety and return to PLOF.      Follow Up Recommendations  SNF    Equipment Recommendations  Other (comment)(TBD)    Recommendations for Other Services       Precautions /  Restrictions Precautions Precautions: Fall Restrictions Weight Bearing Restrictions: No      Mobility Bed Mobility Overal bed mobility: Needs Assistance             General bed mobility comments: deferred supine to sit due to profound weakness, able to perform long sitting with bilateral rails and MIN A for trunk elevation  Transfers                      Balance Overall balance assessment: (Pt endorses decreased ability to maintain head/neck stability w/o support. Able to sit upright in long-sitting with BUE support and CGA from therapist, but does not maintain.)                                         ADL either performed or assessed with clinical judgement   ADL Overall ADL's : Needs assistance/impaired                                       General ADL Comments: Pt is functionally limited by decreased AROM, strength, and coordination in BUE/BLE. MOD-MAX A for LB dressing. MAX A +2 for functional transfers t/f wheel chair. MIN A for seated/bed level grooming and self-feeding. Pt would benefit from opportunity to trial AE such as long-handled spoons, razors, etc. to support safety, satisfaction, and functional independence with ADL tasks. Pt voices desire to maintain her independence at current level for as long as she is able.     Vision  Perception     Praxis      Pertinent Vitals/Pain Pain Assessment: No/denies pain     Hand Dominance Right   Extremity/Trunk Assessment Upper Extremity Assessment Upper Extremity Assessment: RUE deficits/detail;LUE deficits/detail RUE Deficits / Details: Pt is able to bring RUE into ~90d elbow flexion and maintains elbow extension WFLs. Requires AAROM for all other shoulder/arm movements tested. Spelter remains WFLs, however pt states she is generally noticing decreased ability to use scissors, open bottles, etc. RUE generally stronger than LUE. RUE Coordination: decreased gross  motor LUE Deficits / Details: AAROM for all movements, LUE weaker than RUE LUE Coordination: decreased gross motor;decreased fine motor   Lower Extremity Assessment Lower Extremity Assessment: Defer to PT evaluation(AAROM for all movements. Unable to move against gravity w/o physical assist.)       Communication Communication Communication: No difficulties   Cognition Arousal/Alertness: Awake/alert Behavior During Therapy: WFL for tasks assessed/performed Overall Cognitive Status: Within Functional Limits for tasks assessed                                 General Comments: A&O x4, but emotional regarding health status. States she believes it is time to seek facility based care and that she needs to focus on her health rather than things like "my Lucianne Lei tire is flat" or "my porch needs to be fixed".   General Comments       Exercises Other Exercises Other Exercises: Pt educated on safety/falls prevention, role of OT in acute setting, brief discussion regarding AE options for ADL management with AE catolog provided for pt to review. Plan to discuss options, safe use, etc. at next session.   Shoulder Instructions      Home Living Family/patient expects to be discharged to:: Private residence Living Arrangements: Alone(Has roommate downstairs) Available Help at Discharge: Family;Other (Comment)(Dad who is 57 & friends who check on her once in a while.) Type of Home: House Home Access: Ramped entrance     Home Layout: Other (Comment);Able to live on main level with bedroom/bathroom(3 level)     Bathroom Shower/Tub: Teacher, early years/pre: Handicapped height Bathroom Accessibility: Yes How Accessible: Accessible via wheelchair Home Equipment: Toilet riser;Electric scooter;Wheelchair - power(hydrolic toilet riser & hydrolic bath lift.)          Prior Functioning/Environment Level of Independence: Needs assistance  Gait / Transfers Assistance Needed:  lateral scoot for transferring to/from WC/cooter to other surfaces. ADL's / Homemaking Assistance Needed: Pt reports that her disease has progressed significantly since January. She is now unable to transfer in/out of tub even with her chair, and has increased difficulty with dressing, grooming, and feeding herself.            OT Problem List: Decreased strength;Decreased coordination;Decreased range of motion;Decreased activity tolerance;Decreased knowledge of use of DME or AE;Impaired balance (sitting and/or standing);Impaired UE functional use      OT Treatment/Interventions: Self-care/ADL training;Therapeutic exercise;Therapeutic activities;DME and/or AE instruction;Patient/family education;Balance training;Energy conservation    OT Goals(Current goals can be found in the care plan section) Acute Rehab OT Goals Patient Stated Goal: to be as independent as possible OT Goal Formulation: With patient Time For Goal Achievement: 07/27/19 Potential to Achieve Goals: Good ADL Goals Pt Will Perform Eating: with set-up;with supervision;with adaptive utensils;with assist to don/doff brace/orthosis Pt Will Perform Grooming: with set-up;with supervision;with adaptive equipment;sitting Additional ADL Goal #1: With assist from therapist, pt will independently identify 3  items of AE to support safety, functional independence, and personal satisfaction during meanignful occupations of daily life.  OT Frequency: Min 2X/week   Barriers to D/C: Inaccessible home environment;Decreased caregiver support          Co-evaluation              AM-PAC OT "6 Clicks" Daily Activity     Outcome Measure Help from another person eating meals?: A Little Help from another person taking care of personal grooming?: A Little Help from another person toileting, which includes using toliet, bedpan, or urinal?: A Lot Help from another person bathing (including washing, rinsing, drying)?: A Lot Help from  another person to put on and taking off regular upper body clothing?: A Lot Help from another person to put on and taking off regular lower body clothing?: A Lot 6 Click Score: 14   End of Session    Activity Tolerance: Patient tolerated treatment well Patient left: in bed(In stretcher bed in ED. Both rails up.)  OT Visit Diagnosis: Other abnormalities of gait and mobility (R26.89);Other symptoms and signs involving the nervous system (R29.898)                Time: 2440-1027 OT Time Calculation (min): 24 min Charges:  OT General Charges $OT Visit: 1 Visit OT Evaluation $OT Eval High Complexity: 1 High OT Treatments $Self Care/Home Management : 8-22 mins  Rockney Ghee, M.S., OTR/L Ascom: [phone] 07/13/19, 12:56 PM

## 2019-07-13 NOTE — TOC Progression Note (Signed)
Transition of Care [facility]) - Progression Note    Patient Details  Name: [name] MRN: [id number] Date of Birth: [birth year]  Transition of Care [facility]) CM/SW Contact  Enlow Cellar, RN Phone Number: 07/13/2019, 10:01 AM  Clinical Narrative:    Sherron Monday to Dennie Bible, sister of patient, confirmed patient has decided she would like discharge to [facility]. Patient and family have spoken to [facility] regarding admission. RN CM. Fl2 sent to [facility].    Expected Discharge Plan: Skilled Nursing Facility Barriers to Discharge: English as a second language teacher, Continued Medical Work up  Expected Discharge Plan and Services Expected Discharge Plan: Skilled Nursing Facility     Post Acute Care Choice: Skilled Nursing Facility Living arrangements for the past 2 months: Single Family Home                                       Social Determinants of Health (SDOH) Interventions    Readmission Risk Interventions No flowsheet data found.

## 2019-07-13 NOTE — ED Provider Notes (Signed)
SW/CM has seen and evaluated.  Patient desires discharge at this time with [facility] resources. Will arrange for home health resources, and social work will continue to follow with regards to further resources, other possible agencies.  As such, we will proceed with discharge. No new prescriptions indicated at this time.   Miguel Aschoff., MD 07/13/19 [phone]

## 2019-07-13 NOTE — TOC Progression Note (Signed)
Transition of Care [facility]) - Progression Note    Patient Details  Name: [name] MRN: [id number] Date of Birth: [birth year]  Transition of Care [facility]) CM/SW Contact  Saxon Cellar, RN Phone Number: 07/13/2019, 3:43 PM  Clinical Narrative:    LVMM with Verlon Au @ [facility] requesting update on SNF request.    Expected Discharge Plan: Skilled Nursing Facility Barriers to Discharge: English as a second language teacher, Continued Medical Work up  Expected Discharge Plan and Services Expected Discharge Plan: Skilled Nursing Facility     Post Acute Care Choice: Skilled Nursing Facility Living arrangements for the past 2 months: Single Family Home                                       Social Determinants of Health (SDOH) Interventions    Readmission Risk Interventions No flowsheet data found.

## 2019-07-13 NOTE — TOC Progression Note (Addendum)
Transition of Care [facility]) - Progression Note    Patient Details  Name: [name] MRN: [id number] Date of Birth: [birth year]  Transition of Care [facility]) CM/SW Contact  [place] Cellar, RN Phone Number: 07/13/2019, 12:15 PM  Clinical Narrative:    Call to Tammy @ [facility] requesting update on SNF request. Tammy states she is reviewing it now and will call back.   Received call back from Tammy advised patient was denied SNF placement at [facility].   RN CM contacted [facility] for follow up. Verlon Au states she had not received previous request. RN CM resent Fl2 and therapy notes for review.   Will send SNF request to other facilities as a back up. Once SNF accepted will begin insurance authorization.    Expected Discharge Plan: Skilled Nursing Facility Barriers to Discharge: English as a second language teacher, Continued Medical Work up  Expected Discharge Plan and Services Expected Discharge Plan: Skilled Nursing Facility     Post Acute Care Choice: Skilled Nursing Facility Living arrangements for the past 2 months: Single Family Home                                       Social Determinants of Health (SDOH) Interventions    Readmission Risk Interventions No flowsheet data found.

## 2019-07-13 NOTE — ED Notes (Signed)
Pt repositioned at this time.

## 2019-07-15 ENCOUNTER — Other Ambulatory Visit: Payer: Self-pay | Admitting: *Deleted

## 2019-07-15 DIAGNOSIS — R32 Unspecified urinary incontinence: Secondary | ICD-10-CM

## 2019-07-15 NOTE — ED Provider Notes (Deleted)
[facility] Emergency Department Provider Note  ____________________________________________  Time seen: Approximately 11:13 PM  I have reviewed the triage vital signs and the nursing notes.   HISTORY  Chief Complaint Weakness   HPI [name] is a 59 y.o. female with a history of muscular dystrophy who presents from home for progression of her illness.  Patient reports a significant decline since January.  She lives alone and has some assistance from friends during the day.  Over the last 24 hours she reports that she has been unable to transition to and from the wheelchair.  Has been unable to perform her ADLs.  Cannot feed or dress herself anymore.  She has been working with her PCP for placement in a skilled nursing facility however that process has not been fast enough.  She does report worsening of her symptoms in the last 24 hours which prompted visit to the emergency room.  Patient does not feel safe leaving at home anymore.  She denies any fever chills, headache, chest pain or shortness of breath, sore throat, cough, fever or chills, abdominal pain, nausea, vomiting, diarrhea, dysuria.   Past Medical History:  Diagnosis Date  . Depression   . FSHD (facioscapulohumeral muscular dystrophy) (HCC)   . Hashimoto's disease   . Heart murmur   . Hyperthyroidism   . Muscular dystrophy (HCC)   . Thyroid disease   . Urinary incontinence   . Vitamin D deficiency     Patient Active Problem List   Diagnosis Date Noted  . Severe recurrent major depression without psychotic features (HCC) 04/16/2016  . Hypothyroidism 04/16/2016  . OVERFLOW INCONTINENCE 02/21/2009  . HYPERCHOLESTEROLEMIA 01/07/2008  . MUSCULAR DYSTROPHY 01/07/2008  . CARDIAC MURMUR, BENIGN 01/07/2008    Past Surgical History:  Procedure Laterality Date  . CESAREAN SECTION    . TOTAL KNEE ARTHROPLASTY Right   . TUBAL LIGATION  1996   Bilateral    Prior to Admission medications    Medication Sig Start Date End Date Taking? Authorizing Provider  busPIRone (BUSPAR) 7.5 MG tablet Take 7.5 mg by mouth 2 (two) times daily.  05/16/19 05/15/20 Yes [provider]  DULoxetine (CYMBALTA) 20 MG capsule Take 20 mg by mouth at bedtime.  05/30/19 05/29/20 Yes [provider]  levothyroxine (SYNTHROID) 100 MCG tablet Take 112 mcg by mouth daily before breakfast.    Yes [provider]  oxybutynin (DITROPAN) 5 MG tablet Take 1 tablet (5 mg total) by mouth 2 (two) times daily. 06/18/15  Yes Defrancesco, Prentice Docker, MD  venlafaxine XR (EFFEXOR-XR) 75 MG 24 hr capsule Take 75 mg by mouth at bedtime. 06/28/19  Yes [provider]  chlorhexidine (PERIDEX) 0.12 % solution Use as directed 15 mLs in the mouth or throat 2 (two) times daily. Patient not taking: Reported on 07/12/2019 06/16/19   Verlee Monte, NP  traZODone (DESYREL) 50 MG tablet Take 1 tablet (50 mg total) by mouth at bedtime. 04/18/16 06/16/19  Jimmy Footman, MD    Allergies Patient has no known allergies.  Family History  Problem Relation Age of Onset  . Muscular dystrophy Mother   . Stroke Mother   . Cancer Mother        uterine   . Hyperlipidemia Father   . Muscular dystrophy Sister   . Heart failure Sister   . Aneurysm Brother   . Breast cancer Neg Hx   . Colon cancer Neg Hx   . Ovarian cancer Neg Hx  Social History Social History   Tobacco Use  . Smoking status: Never Smoker  . Smokeless tobacco: Never Used  Substance Use Topics  . Alcohol use: Yes    Comment: social  . Drug use: No    Review of Systems  Constitutional: Negative for fever. + Generalized weakness Eyes: Negative for visual changes. ENT: Negative for sore throat. Neck: No neck pain  Cardiovascular: Negative for chest pain. Respiratory: Negative for shortness of breath. Gastrointestinal: Negative for abdominal pain, vomiting or diarrhea. Genitourinary: Negative for dysuria. Musculoskeletal:  Negative for back pain. Skin: Negative for rash. Neurological: Negative for headaches, weakness or numbness. Psych: No SI or HI  ____________________________________________   PHYSICAL EXAM:  VITAL SIGNS: ED Triage Vitals  Enc Vitals Group     BP 07/11/19 1601 126/89     Pulse Rate 07/11/19 1601 80     Resp 07/11/19 1601 18     Temp 07/11/19 1601 98.4 F (36.9 C)     Temp Source 07/11/19 1601 Oral     SpO2 07/11/19 1601 95 %     Weight 07/11/19 1557 220 lb (99.8 kg)     Height 07/11/19 1557 5\' 5"  (1.651 m)     Head Circumference --      [facility] Flow --      Pain Score 07/11/19 1557 0     Pain Loc --      Pain Edu? --      Excl. in [place]? --     Constitutional: Alert and oriented. Well appearing and in no apparent distress. HEENT:      Head: Normocephalic and atraumatic.         Eyes: Conjunctivae are normal. Sclera is non-icteric.       Mouth/Throat: Mucous membranes are moist.       Neck: Supple with no signs of meningismus. Cardiovascular: Regular rate and rhythm. No murmurs, gallops, or rubs. Respiratory: Normal respiratory effort. Lungs are clear to auscultation bilaterally Gastrointestinal: Soft, non tender, and non distended with positive bowel sounds. Musculoskeletal: No edema, cyanosis, or erythema of extremities. Neurologic: Normal speech and language. Face is symmetric. Muscular contractions and wasting x4. Skin: Skin is warm, dry and intact. No rash noted. Psychiatric: Mood and affect are normal. Speech and behavior are normal.  ____________________________________________   LABS (all labs ordered are listed, but only abnormal results are displayed)  Labs Reviewed  BASIC METABOLIC PANEL - Abnormal; Notable for the following components:      Result Value   Glucose, Bld 101 (*)    Creatinine, Ser <0.30 (*)    All other components within normal limits  URINALYSIS, COMPLETE (UACMP) WITH MICROSCOPIC - Abnormal; Notable for the following components:   Color,  Urine STRAW (*)    APPearance CLEAR (*)    Specific Gravity, Urine 1.003 (*)    Bacteria, UA RARE (*)    All other components within normal limits  SARS CORONAVIRUS 2 BY RT PCR (HOSPITAL ORDER, [place] [facility])  CBC  CK   ____________________________________________  EKG  none  ____________________________________________  RADIOLOGY  none  ____________________________________________   PROCEDURES  Procedure(s) performed: None Procedures Critical Care performed:  None ____________________________________________   INITIAL IMPRESSION / ASSESSMENT AND PLAN / ED COURSE   59 y.o. female with a history of muscular dystrophy who presents from home for progression of her illness.  Patient is here with a friend, has been having progression of her illness since January.  Now is unable to  perform her ADLs, or even transition to and from the wheelchair.  She has limited home health and otherwise lives alone.  Her exam is stable with no findings other than dose of muscle contraction and wasting which is expected due to her illness.  Labs were done which showed no signs of electrolyte abnormalities, AKI, UTI, dehydration, or any other acute pathology.  This is most likely progression of her illness.  Will consult social work for placement in a skilled nursing facility.  History is gathered from patient and her friend who was at bedside.  Old medical records reviewed.       _____________________________________________ Please note:  Patient was evaluated in Emergency Department today for the symptoms described in the history of present illness. Patient was evaluated in the context of the global COVID-19 pandemic, which necessitated consideration that the patient might be at risk for infection with the SARS-CoV-2 virus that causes COVID-19. Institutional protocols and algorithms that pertain to the evaluation of patients at risk for COVID-19 are in a state of rapid change  based on information released by regulatory bodies including the CDC and federal and state organizations. These policies and algorithms were followed during the patient's care in the ED.  Some ED evaluations and interventions may be delayed as a result of limited staffing during the pandemic.   [place] Controlled Substance Database was reviewed by me. ____________________________________________   FINAL CLINICAL IMPRESSION(S) / ED DIAGNOSES   Final diagnoses:  Muscular dystrophy (HCC)      NEW MEDICATIONS STARTED DURING THIS VISIT:  ED Discharge Orders    None       Note:  This document was prepared using Dragon voice recognition software and may include unintentional dictation errors.    Don Perking, [place], MD 07/15/19 [phone]

## 2019-07-19 ENCOUNTER — Ambulatory Visit: Payer: Medicare HMO | Admitting: Urology

## 2019-07-19 ENCOUNTER — Encounter: Payer: Self-pay | Admitting: Urology

## 2019-07-19 ENCOUNTER — Other Ambulatory Visit: Payer: Self-pay

## 2019-07-19 ENCOUNTER — Telehealth: Payer: Self-pay

## 2019-07-19 VITALS — BP 125/92 | HR 99 | Ht 65.0 in | Wt 200.0 lb

## 2019-07-19 DIAGNOSIS — N3281 Overactive bladder: Secondary | ICD-10-CM | POA: Diagnosis not present

## 2019-07-19 DIAGNOSIS — R32 Unspecified urinary incontinence: Secondary | ICD-10-CM

## 2019-07-19 LAB — BLADDER SCAN AMB NON-IMAGING: Scan Result: 51

## 2019-07-19 MED ORDER — OXYBUTYNIN CHLORIDE ER 15 MG PO TB24
15.0000 mg | ORAL_TABLET | Freq: Every day | ORAL | 11 refills | Status: DC
Start: 2019-07-19 — End: 2020-05-18

## 2019-07-19 NOTE — Progress Notes (Signed)
07/19/19 3:23 PM   [name] 1960/07/05 [id number]  CC: OAB  HPI: I saw Ms. Hapke in urology clinic today for evaluation of OAB.  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  She is wheelchair-bound secondary to her muscular dystrophy and is primarily bothered by urinary urgency and inability to make it to the bathroom in time secondary to her challenging transfers with need for power lift.  She has been on on oxybutynin 5 mg immediate release twice daily long-term.  She denies any recurrent infections or history of retention or gross hematuria.  She has become increasingly dependent on help at home and has minimal assistance.  She is becoming very frustrated with her bladder symptoms and inability to go out without having to use a pad.  She normally uses 2 pads per day.  She leaks overnight into a depends.  She is wondering if a suprapubic tube would be a good option for her.  She denies any stress incontinence.  She drinks diet Dr. Reino Kent and other sodas during the day, occasional margaritas and alcohol, and coffee in the morning.  Urinalysis last week was benign.  PVR today is normal at 50 mL.  PMH: Past Medical History:  Diagnosis Date  . Depression   . FSHD (facioscapulohumeral muscular dystrophy) (HCC)   . Hashimoto's disease   . Heart murmur   . Hyperthyroidism   . Muscular dystrophy (HCC)   . Thyroid disease   . Urinary incontinence   . Vitamin D deficiency     Surgical History: Past Surgical History:  Procedure Laterality Date  . CESAREAN SECTION    . TOTAL KNEE ARTHROPLASTY Right   . TUBAL LIGATION  1996   Bilateral    Family History: Family History  Problem Relation Age of Onset  . Muscular dystrophy Mother   . Stroke Mother   . Cancer Mother        uterine   . Hyperlipidemia Father   . Muscular dystrophy Sister   . Heart failure Sister   .  Aneurysm Brother   . Breast cancer Neg Hx   . Colon cancer Neg Hx   . Ovarian cancer Neg Hx     Social History:  reports that she has never smoked. She has never used smokeless tobacco. She reports current alcohol use. She reports that she does not use drugs.  Physical Exam: BP (!) 125/92   Pulse 99   Ht 5\' 5"  (1.651 m)   Wt 200 lb (90.7 kg)   LMP 04/28/2015 (Exact Date)   BMI 33.28 kg/m    Constitutional:  Alert and oriented, in power wheelchair Cardiovascular: No clubbing, cyanosis, or edema. Respiratory: Normal respiratory effort, no increased work of breathing. GI: Abdomen is soft, nontender, nondistended, no abdominal masses  Laboratory Data: Reviewed  Assessment & Plan:   In summary, she is a 59 year old female with facioscapulohumeral muscular dystrophy, and long-term overactive bladder symptoms.  We reviewed that facioscapulohumeral muscular dystrophy does not affect bladder function, however her OAB symptoms in combination with her difficult transfer secondary to her muscular dystrophy are significantly impacting her quality of life.    We discussed that overactive bladder (OAB) is not a disease, but is a symptom complex that is generally not life-threatening.  Symptoms typically include urinary urgency, frequency, and urge incontinence.  There are numerous treatment options, however there are risks and benefits with both medical and  surgical management.  First-line treatment is behavioral therapies including bladder training, pelvic floor muscle training, and fluid management.  Second line treatments include oral antimuscarinics(Ditropan er, Trospium) and beta-3 agonist (Mybetriq). There is typically a period of medication trial (4-8 weeks) to find the optimal therapy and dosing. If symptoms are bothersome despite the above management, third line options include intra-detrusor botox, peripheral tibial nerve stimulation (PTNS), and interstim (SNS). These are more invasive  treatments with higher side effect profile, but may improve quality of life for patients with severe OAB symptoms.   -Trial of oxybutynin 15 mg XL -Behavioral strategies discussed at length -Information given for PureWick catheter, as this may be a very good option overnight for her -Not a good candidate for Botox, she would be unable to perform intermittent catheterization if needed -I stressed at length that suprapubic tube would be a last resort option if we cannot improve her quality of life with the above strategies -RTC 6 weeks for symptom check, consider Myrbetriq trial at that time if persistent symptoms  Nickolas Madrid, MD 07/19/2019  [place] [address], [place], [place] [address] [phone]

## 2019-07-19 NOTE — Telephone Encounter (Signed)
Confirmed with [place] @ [facility] patient is active for [facility] and has already been seen.

## 2019-07-19 NOTE — Patient Instructions (Addendum)
[email or web address]  PureWickT Female External Catheter The PureWickT Female External Catheter is non-invasive and draws urine away from the body. It is a single-use device that should be replaced at least every 8-12 hours.  [phone]  Overactive Bladder, Adult  Overactive bladder refers to a condition in which a person has a sudden need to pass urine. The person may leak urine if he or she cannot get to the bathroom fast enough (urinary incontinence). A person with this condition may also wake up several times in the night to go to the bathroom. Overactive bladder is associated with poor nerve signals between your bladder and your brain. Your bladder may get the signal to empty before it is full. You may also have very sensitive muscles that make your bladder squeeze too soon. These symptoms might interfere with daily work or social activities. What are the causes? This condition may be associated with or caused by:  Urinary tract infection.  Infection of nearby tissues, such as the prostate.  Prostate enlargement.  Surgery on the uterus or urethra.  Bladder stones, inflammation, or tumors.  Drinking too much caffeine or alcohol.  Certain medicines, especially medicines that get rid of extra fluid in the body (diuretics).  Muscle or nerve weakness, especially from: ? A spinal cord injury. ? Stroke. ? Multiple sclerosis. ? Parkinson's disease.  Diabetes.  Constipation. What increases the risk? You may be at greater risk for overactive bladder if you:  Are an older adult.  Smoke.  Are going through menopause.  Have prostate problems.  Have a neurological disease, such as stroke, dementia, Parkinson's disease, or multiple sclerosis (MS).  Eat or drink things that irritate the bladder. These include alcohol, spicy food, and caffeine.  Are overweight or obese. What are the signs or symptoms? Symptoms of this condition include:  Sudden,  strong urge to urinate.  Leaking urine.  Urinating 8 or more times a day.  Waking up to urinate 2 or more times a night. How is this diagnosed? Your health care provider may suspect overactive bladder based on your symptoms. He or she will diagnose this condition by:  A physical exam and medical history.  Blood or urine tests. You might need bladder or urine tests to help determine what is causing your overactive bladder. You might also need to see a health care provider who specializes in urinary tract problems (urologist). How is this treated? Treatment for overactive bladder depends on the cause of your condition and whether it is mild or severe. You can also make lifestyle changes at home. Options include:  Bladder training. This may include: ? Learning to control the urge to urinate by following a schedule that directs you to urinate at regular intervals (timed voiding). ? Doing Kegel exercises to strengthen your pelvic floor muscles, which support your bladder. Toning these muscles can help you control urination, even if your bladder muscles are overactive.  Special devices. This may include: ? Biofeedback, which uses sensors to help you become aware of your body's signals. ? Electrical stimulation, which uses electrodes placed inside the body (implanted) or outside the body. These electrodes send gentle pulses of electricity to strengthen the nerves or muscles that control the bladder. ? Women may use a plastic device that fits into the vagina and supports the bladder (pessary).  Medicines. ? Antibiotics to treat bladder infection. ? Antispasmodics to stop the bladder from releasing urine at the wrong time. ? Tricyclic antidepressants to relax bladder muscles. ? Injections of botulinum toxin  type A directly into the bladder tissue to relax bladder muscles.  Lifestyle changes. This may include: ? Weight loss. Talk to your health care provider about weight loss methods that  would work best for you. ? Diet changes. This may include reducing how much alcohol and caffeine you consume, or drinking fluids at different times of the day. ? Not smoking. Do not use any products that contain nicotine or tobacco, such as cigarettes and e-cigarettes. If you need help quitting, ask your health care provider.  Surgery. ? A device may be implanted to help manage the nerve signals that control urination. ? An electrode may be implanted to stimulate electrical signals in the bladder. ? A procedure may be done to change the shape of the bladder. This is done only in very severe cases. Follow these instructions at home: Lifestyle  Make any diet or lifestyle changes that are recommended by your health care provider. These may include: ? Drinking less fluid or drinking fluids at different times of the day. ? Cutting down on caffeine or alcohol. ? Doing Kegel exercises. ? Losing weight if needed. ? Eating a healthy and balanced diet to prevent constipation. This may include:  Eating foods that are high in fiber, such as fresh fruits and vegetables, whole grains, and beans.  Limiting foods that are high in fat and processed sugars, such as fried and sweet foods. General instructions  Take over-the-counter and prescription medicines only as told by your health care provider.  If you were prescribed an antibiotic medicine, take it as told by your health care provider. Do not stop taking the antibiotic even if you start to feel better.  Use any implants or pessary as told by your health care provider.  If needed, wear pads to absorb urine leakage.  Keep a journal or log to track how much and when you drink and when you feel the need to urinate. This will help your health care provider monitor your condition.  Keep all follow-up visits as told by your health care provider. This is important. Contact a health care provider if:  You have a fever.  Your symptoms do not get  better with treatment.  Your pain and discomfort get worse.  You have more frequent urges to urinate. Get help right away if:  You are not able to control your bladder. Summary  Overactive bladder refers to a condition in which a person has a sudden need to pass urine.  Several conditions may lead to an overactive bladder.  Treatment for overactive bladder depends on the cause and severity of your condition.  Follow your health care provider's instructions about lifestyle changes, doing Kegel exercises, keeping a journal, and taking medicines. This information is not intended to replace advice given to you by your health care provider. Make sure you discuss any questions you have with your health care provider. Document Revised: 05/27/2018 Document Reviewed: 02/19/2017 Elsevier Patient Education  2020 [employer].

## 2019-07-19 NOTE — Telephone Encounter (Signed)
Attempted to call patient to confirm [facility] had started services as planned this week. No answer and VM full. Will try to confirm with [place] @ [facility] that services started.

## 2019-08-30 ENCOUNTER — Other Ambulatory Visit: Payer: Self-pay

## 2019-08-30 ENCOUNTER — Telehealth (INDEPENDENT_AMBULATORY_CARE_PROVIDER_SITE_OTHER): Payer: Medicare HMO | Admitting: Urology

## 2019-08-30 DIAGNOSIS — N3281 Overactive bladder: Secondary | ICD-10-CM

## 2019-08-30 NOTE — Progress Notes (Addendum)
Virtual Visit via Telephone Note  I connected with [name] on 08/30/19 at  2:00 PM EDT by telephone and verified that I am speaking with the correct person using two identifiers.  Provider location: [facility] clinic Patient location: Home   I discussed the limitations, risks, security and privacy concerns of performing an evaluation and management service by telephone and the availability of in person appointments. We discussed the impact of the COVID-19 pandemic on the healthcare system, and the importance of social distancing and reducing patient and provider exposure. I also discussed with the patient that there may be a patient responsible charge related to this service. The patient expressed understanding and agreed to proceed.  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  She is wheelchair-bound secondary to her muscular dystrophy and is primarily bothered by urinary urgency and inability to make it to the bathroom in time secondary to her challenging transfers with need for power lift.  She had been on on oxybutynin 5 mg immediate release twice daily long-term.  She does not have a history of recurrent infections, urinary retention, gross hematuria.  She was using 2 pads per day.  At our visit on 07/19/2019 we changed her over to oxybutynin 15 mg extended release, and also discussed the possibility of a PureWick catheter overnight.  Since changing over the oxybutynin she has noticed a massive improvement.  She reports she is doing very well not having any significant leakage at this time.  She never looked into the pure wick catheter further since she is doing so well on the oxybutynin.  We again reviewed behavioral strategies including avoiding diet sodas, alcohol, teas, and caffeine in regards to bladder  symptoms.  Assessment and Plan: Continue oxybutynin 15 mg daily RTC 1 year for symptom check     I discussed the assessment and treatment plan with the patient. The patient was provided an opportunity to ask questions and all were answered. The patient agreed with the plan and demonstrated an understanding of the instructions.   The patient was advised to call back or seek an in-person evaluation if the symptoms worsen or if the condition fails to improve as anticipated.  I provided 15 minutes of non-face-to-face time during this encounter.   Sondra Come, MD

## 2020-02-06 ENCOUNTER — Ambulatory Visit (INDEPENDENT_AMBULATORY_CARE_PROVIDER_SITE_OTHER): Payer: Medicare HMO

## 2020-02-06 ENCOUNTER — Ambulatory Visit
Admission: EM | Admit: 2020-02-06 | Discharge: 2020-02-06 | Disposition: A | Discharge status: Home / Self Care | Payer: Medicare HMO | Attending: Sports Medicine | Admitting: Sports Medicine

## 2020-02-06 ENCOUNTER — Encounter: Payer: Self-pay | Admitting: Emergency Medicine

## 2020-02-06 ENCOUNTER — Other Ambulatory Visit: Payer: Self-pay

## 2020-02-06 DIAGNOSIS — J1282 Pneumonia due to coronavirus disease 2019: Secondary | ICD-10-CM | POA: Diagnosis not present

## 2020-02-06 DIAGNOSIS — R059 Cough, unspecified: Secondary | ICD-10-CM | POA: Diagnosis not present

## 2020-02-06 DIAGNOSIS — R52 Pain, unspecified: Secondary | ICD-10-CM

## 2020-02-06 DIAGNOSIS — R0602 Shortness of breath: Secondary | ICD-10-CM | POA: Diagnosis not present

## 2020-02-06 DIAGNOSIS — J9601 Acute respiratory failure with hypoxia: Secondary | ICD-10-CM | POA: Insufficient documentation

## 2020-02-06 DIAGNOSIS — R0981 Nasal congestion: Secondary | ICD-10-CM

## 2020-02-06 DIAGNOSIS — U071 COVID-19: Secondary | ICD-10-CM | POA: Diagnosis not present

## 2020-02-06 LAB — RESP PANEL BY RT-PCR (FLU A&B, COVID) ARPGX2
Influenza A by PCR: NEGATIVE
Influenza B by PCR: NEGATIVE
SARS Coronavirus 2 by RT PCR: POSITIVE — AB

## 2020-02-06 NOTE — ED Provider Notes (Signed)
[facility]    CSN: [id number] Arrival date & time: 02/06/20  1107      History   Chief Complaint Chief Complaint  Patient presents with   Generalized Body Aches   Shortness of Breath    HPI [name] is a 59 y.o. female presenting for 1 week history of cough and nasal congestion.  Patient admits to increased fatigue and body aches as well.  Patient states over the past week she has had shortness of breath which is been getting worse each day and especially as the day is gone on today.  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  Patient denies any known COVID-19 exposure.  She is not vaccinated for COVID-19.  She states that her 19 year old father recently had a cold, but got better a couple of days.  She has been taking over-the-counter Mucinex and Robitussin for symptoms.  Is also been taking Tylenol because she has felt feverish, but has not recorded temperature.  Denies chest pain, back pain, dizziness, palpitations, abdominal pain, n/v/d. Patient denies any history of asthma or COPD.  Denies heart failure.  She has no other complaints or concerns today.  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 Authorizing Provider  levothyroxine (SYNTHROID) 100 MCG tablet Take 112 mcg by mouth daily before breakfast.    Yes [provider]  oxybutynin (DITROPAN XL) 15 MG 24 hr tablet Take 1 tablet (15 mg total) by mouth daily. 07/19/19  Yes Sondra Come, MD  venlafaxine XR (EFFEXOR-XR) 75 MG 24 hr capsule Take 75 mg by mouth at bedtime. 06/28/19  Yes [provider]  busPIRone (BUSPAR) 7.5 MG tablet Take 7.5 mg by mouth 2 (two) times daily.  05/16/19 05/15/20  [provider]  DULoxetine (CYMBALTA) 20 MG capsule Take 20 mg by mouth at bedtime.  05/30/19 05/29/20  [provider]  traZODone (DESYREL) 50 MG tablet Take 1 tablet (50 mg total) by mouth at bedtime. 04/18/16 06/16/19  Jimmy Footman, MD    Family History Family History  Problem Relation Age of Onset   Muscular dystrophy Mother    Stroke Mother  Cancer Mother        uterine    Hyperlipidemia Father    Muscular dystrophy Sister    Heart failure Sister    Aneurysm Brother    Breast cancer Neg Hx    Colon cancer Neg Hx    Ovarian cancer Neg Hx     Social History Social History   Tobacco Use   Smoking status: Never Smoker   Smokeless tobacco: Never Used  Building services engineerVaping Use   Vaping Use: Never used  Substance Use Topics   Alcohol use: Yes    Comment: social   Drug use: No     Allergies   Patient has no known allergies.   Review of Systems Review of Systems  Constitutional: Positive for fatigue. Negative for chills, diaphoresis and fever.  HENT: Positive for congestion and rhinorrhea. Negative for ear pain, sinus pressure, sinus pain and sore throat.   Respiratory: Positive for cough and shortness of breath. Negative for wheezing.    Cardiovascular: Negative for chest pain.  Gastrointestinal: Negative for abdominal pain, nausea and vomiting.  Musculoskeletal: Negative for arthralgias and myalgias.  Skin: Negative for rash.  Neurological: Negative for weakness and headaches.  Hematological: Negative for adenopathy.     Physical Exam Triage Vital Signs ED Triage Vitals  Enc Vitals Group     BP 02/06/20 1232 117/82     Pulse Rate 02/06/20 1232 (!) 110     Resp 02/06/20 1232 20     Temp 02/06/20 1232 98.6 F (37 C)     Temp Source 02/06/20 1232 Oral     SpO2 02/06/20 1232 92 %     Weight 02/06/20 1230 200 lb (90.7 kg)     Height 02/06/20 1230 5\' 5"  (1.651 m)     Head Circumference --      Peak Flow --      Pain Score 02/06/20 1229 3     Pain Loc --      Pain Edu? --      Excl. in GC? --    No data found.  Updated Vital Signs BP 117/82 (BP Location: Right Arm)    Pulse (!) 110    Temp 98.6 F (37 C) (Oral)    Resp 20    Ht 5\' 5"  (1.651 m)    Wt 200 lb (90.7 kg)    LMP 04/28/2015 (Exact Date)    SpO2 92%    BMI 33.28 kg/m      Physical Exam Vitals and nursing note reviewed.  Constitutional:      General: She is not in acute distress.    Appearance: Normal appearance. She is not ill-appearing or toxic-appearing.     Comments: Is non weight bearing. Patient currently using scooter  HENT:     Head: Normocephalic and atraumatic.     Nose: Congestion and rhinorrhea present.     Mouth/Throat:     Mouth: Mucous membranes are moist.     Pharynx: Oropharynx is clear.  Eyes:     General: No scleral icterus.       Right eye: No discharge.        Left eye: No discharge.     Conjunctiva/sclera: Conjunctivae normal.  Cardiovascular:     Rate and Rhythm: Regular rhythm. Tachycardia present.     Heart sounds: Normal heart sounds.  Pulmonary:     Effort: Respiratory distress (mildy increased RR) present.     Breath sounds: Normal breath sounds. No wheezing, rhonchi or rales.  Musculoskeletal:     Cervical  back: Neck supple.  Skin:    General: Skin is dry.  Neurological:     General: No focal deficit present.     Mental Status: She is alert. Mental status is at baseline.     Motor: No weakness.     Gait: Gait normal.  Psychiatric:        Mood and Affect: Mood normal.        Behavior: Behavior normal.        Thought Content: Thought content normal.      UC Treatments / Results  Labs (all labs ordered are listed, but only abnormal results are displayed) Labs Reviewed  RESP PANEL BY RT-PCR (FLU A&B, COVID) [id number]    EKG   Radiology DG Chest 2 View  Result Date: 02/06/2020 CLINICAL DATA:  Shortness of breath and cough for 7 days. Nasal congestion 1 week ago. Persistent body aches. EXAM: CHEST - 2 VIEW COMPARISON:  01/25/2014. FINDINGS: Trachea is midline. Heart size within normal limits. Atherosclerotic calcification of the aorta. Lungs are somewhat low in volume with mixed streaky and patchy opacities at the lung bases. Suspect trace bilateral pleural effusions. IMPRESSION: 1. Low lung volumes with bibasilar streaky and patchy airspace opacities which may be due to atelectasis or pneumonia, including due to COVID-19. 2. Trace bilateral pleural effusions. 3.  Aortic atherosclerosis (ICD10-I70.0). Electronically Signed   By: Leanna Battles M.D.   On: 02/06/2020 13:03    Procedures Procedures (including critical care time)  Medications Ordered in UC Medications - No data to display  Initial Impression / Assessment and Plan / UC Course  I have reviewed the triage vital signs and the nursing notes.  Pertinent labs & imaging results that were available during my care of the patient were reviewed by me and considered in my medical decision making (see chart for details).   59 year-old female presenting for 1 week history of cough and congestion.  She admits to worsening shortness of breath over the past several days.  Patient has muscular dystrophy and does not bear  weight.  Patient is tachycardic at 110 bpm.  She is afebrile and blood pressure normal.  Temperature 98.6 degrees.  Oxygen saturation is 92 degrees at rest.  I am unable to ambulate the patient to see if the O2 drops.  Chest x-ray independently reviewed and shows bilateral patchy opacities which can be consistent with Covid pneumonia.  Respiratory panel is positive for COVID-19.  Discussed results with patient.  Advised patient that I am concerned that she has Covid pneumonia and low oxygen saturation.  Advised that she needs immediate treatment as there is not much that I can offer her on an outpatient basis.  Advised I can call EMS, but she declines and states that she would like to go home and use the bathroom and take her scooter home and then she plans to call EMS when she gets home.  Patient states she lives about 15 minutes away.  Patient did sign an AMA since she declined for [place] to call EMS for her today.  Discussed risks of waiting to be seen and treated for COVID-19 pneumonia.  Patient is understanding and agreeable.   Final Clinical Impressions(s) / UC Diagnoses   Final diagnoses:  Pneumonia due to COVID-19 virus  Acute respiratory failure with hypoxia (HCC)  Shortness of breath     Discharge Instructions     Your Covid test was positive and your chest x-ray  shows Covid pneumonia.  I have advised you to go to emergency department this time.  You have declined EMS transport and state that you want to go home first.  Please call EMS when you get home if your father cannot take you.  Your oxygen saturation is low and you need further care and support.  You have been advised to follow up immediately in the emergency department for concerning signs.symptoms. If you declined EMS transport, please have a family member take you directly to the ED at this time. Do not delay. Based on concerns about condition, if you do not follow up in th e ED, you may risk poor outcomes including worsening  of condition, delayed treatment and potentially life threatening issues. If you have declined to go to the ED at this time, you should call your PCP immediately to set up a follow up appointment.  Go to ED for red flag symptoms, including; fevers you cannot reduce with Tylenol/Motrin, severe headaches, vision changes, numbness/weakness in part of the body, lethargy, confusion, intractable vomiting, severe dehydration, chest pain, breathing difficulty, severe persistent abdominal or pelvic pain, signs of severe infection (increased redness, swelling of an area), feeling faint or passing out, dizziness, etc. You should especially go to the ED for sudden acute worsening of condition if you do not elect to go at this time.     ED Prescriptions    None     PDMP not reviewed this encounter.   Shirlee Latch, PA-C 02/06/20 1339

## 2020-02-06 NOTE — ED Triage Notes (Signed)
Patient states she had a cold and nasal congestion about 1 week ago. She states that resolved but she has continued to have body aches and shortness of breath.

## 2020-02-06 NOTE — Discharge Instructions (Signed)
Your Covid test was positive and your chest x-ray shows Covid pneumonia.  I have advised you to go to emergency department this time.  You have declined EMS transport and state that you want to go home first.  Please call EMS when you get home if your father cannot take you.  Your oxygen saturation is low and you need further care and support.  You have been advised to follow up immediately in the emergency department for concerning signs.symptoms. If you declined EMS transport, please have a family member take you directly to the ED at this time. Do not delay. Based on concerns about condition, if you do not follow up in th e ED, you may risk poor outcomes including worsening of condition, delayed treatment and potentially life threatening issues. If you have declined to go to the ED at this time, you should call your PCP immediately to set up a follow up appointment.  Go to ED for red flag symptoms, including; fevers you cannot reduce with Tylenol/Motrin, severe headaches, vision changes, numbness/weakness in part of the body, lethargy, confusion, intractable vomiting, severe dehydration, chest pain, breathing difficulty, severe persistent abdominal or pelvic pain, signs of severe infection (increased redness, swelling of an area), feeling faint or passing out, dizziness, etc. You should especially go to the ED for sudden acute worsening of condition if you do not elect to go at this time.

## 2020-02-07 ENCOUNTER — Telehealth: Payer: Self-pay | Admitting: Nurse Practitioner

## 2020-02-07 NOTE — Telephone Encounter (Signed)
Called to Discuss with patient about Covid symptoms and the use of the monoclonal antibody infusion for those with mild to moderate Covid symptoms and at a high risk of hospitalization.     Pt appears to qualify for this infusion due to co-morbid conditions and/or a member of an at-risk group in accordance with the FDA Emergency Use Authorization.    Left message requesting return call to [facility] hotline.

## 2020-05-18 ENCOUNTER — Other Ambulatory Visit: Payer: Self-pay | Admitting: Urology

## 2020-08-08 ENCOUNTER — Other Ambulatory Visit: Payer: Self-pay | Admitting: *Deleted

## 2020-08-08 MED ORDER — OXYBUTYNIN CHLORIDE ER 15 MG PO TB24: 15.0000 mg | ORAL_TABLET | Freq: Every day | ORAL | 1 refills | Status: DC

## 2020-08-28 ENCOUNTER — Ambulatory Visit: Payer: Medicare HMO | Admitting: Urology

## 2020-10-30 ENCOUNTER — Other Ambulatory Visit: Payer: Self-pay | Admitting: Urology

## 2020-10-30 NOTE — Telephone Encounter (Signed)
Pt needs appt, refill denied.

## 2020-12-14 ENCOUNTER — Other Ambulatory Visit: Payer: Self-pay | Admitting: Urology

## 2020-12-23 NOTE — Progress Notes (Signed)
12/24/2020 12:50 PM   [name] 10-20-1960 [id number]  Referring provider: Elza Rafter, MD [address] [place],  [place] [address]  Chief Complaint  Patient presents with   Follow-up   Urinary Tract Infection   Urological history: 1. rUTI's -contributing factors of age, vaginal atrophy, incontinence and constipation -documented positive urine cultures over the last year  Klebsiella pneumoniae on 10/26/2020  Klebsiella pneumoniae on 09/06/2020  2. Incontinence -contributing factors of age, non-ambulatory, depression, caffeine, sugary drinks, diet drinks, OAB medications and antidepressants -PVR 0 mL  -managed with depends  3. OAB -contributing factors of age, depression, caffeine, OAB medications and antidepressants -managed with oxybutynin XL 15 mg daily  HPI: [name] is a 60 y.o. female who presents today for possible UTI.  UA >50 WBC's, 6-10 RBC's, few bacteria and WBC casts present  PVR 0 mL   She is experiencing frequency, urgency, dysuria and incontinence.  She also has issues for constipation.  Patient denies any modifying or aggravating factors.  Patient denies any gross hematuria, dysuria or suprapubic/flank pain.  Patient denies any fevers, chills, nausea or vomiting.    She does not have a history of nephrolithiasis, GU surgery or GU trauma.   She is not sexually active.    She is postmenopausal.   She does not engage in good perineal hygiene. She does/does not take tub baths.   She has incontinence.  She is using incontinence pads.  Due to her MS, she has to sleep in a recliner wearing depends as she cannot get to the restroom on time.      PMH: Past Medical History:  Diagnosis Date   Depression    FSHD (facioscapulohumeral muscular dystrophy) ([place])    Hashimoto's disease    Heart murmur    Hyperthyroidism    Muscular dystrophy ([place])    Thyroid disease    Urinary incontinence    Vitamin D deficiency      Surgical History: Past Surgical History:  Procedure Laterality Date   CESAREAN SECTION     TOTAL KNEE ARTHROPLASTY Right    TUBAL LIGATION  1996   Bilateral    Home Medications:  Allergies as of 12/24/2020       Reactions   Duloxetine Shortness Of Breath        Medication List        Accurate as of December 24, 2020 11:59 PM. If you have any questions, ask your nurse or doctor.          STOP taking these medications    DULoxetine 20 MG capsule Commonly known as: CYMBALTA Stopped by: Zara Council, PA-C       TAKE these medications    levothyroxine 100 MCG tablet Commonly known as: SYNTHROID Take 112 mcg by mouth daily before breakfast.   oxybutynin 15 MG 24 hr tablet Commonly known as: DITROPAN XL Take 1 tablet (15 mg total) by mouth daily.   Premarin vaginal cream Generic drug: conjugated estrogens Apply 0.15m (pea-sized amount)  just inside the vaginal introitus with a finger-tip on  Monday, Wednesday and Friday nights. Started by: SZara Council PA-C   sulfamethoxazole-trimethoprim 800-160 MG tablet Commonly known as: BACTRIM DS Take 1 tablet by mouth every 12 (twelve) hours. Started by: SZara Council PA-C   venlafaxine XR 75 MG 24 hr capsule Commonly known as: EFFEXOR-XR Take 75 mg by mouth at bedtime.        Allergies:  Allergies  Allergen Reactions   Duloxetine  Shortness Of Breath    Family History: Family History  Problem Relation Age of Onset   Muscular dystrophy Mother    Stroke Mother    Cancer Mother        uterine    Hyperlipidemia Father    Muscular dystrophy Sister    Heart failure Sister    Aneurysm Brother    Breast cancer Neg Hx    Colon cancer Neg Hx    Ovarian cancer Neg Hx     Social History:  reports that she has never smoked. She has never used smokeless tobacco. She reports current alcohol use. She reports that she does not use drugs.  ROS: Pertinent ROS in HPI  Physical Exam: BP 105/75    Pulse 97   Ht _0  (1.651 m)   Wt 180 lb (81.6 kg)   LMP 04/28/2015 (Exact Date)   BMI 29.95 kg/m   Constitutional:  Well nourished. Alert and oriented, No acute distress. HEENT: [place] AT, mask in place  Trachea midline Cardiovascular: No clubbing, cyanosis, or edema. Respiratory: Normal respiratory effort, no increased work of breathing. Neurologic: Grossly intact, no focal deficits, moving all 4 extremities. In a wheelchair.   Psychiatric: Normal mood and affect.    Laboratory Data: Urinalysis: Component     Latest Ref Rng & Units 12/24/2020  Color, Urine     YELLOW YELLOW  Appearance     CLEAR CLEAR  Specific Gravity, Urine     1.005 - 1.030 1.010  pH     5.0 - 8.0 6.0  Glucose, UA     NEGATIVE mg/dL NEGATIVE  Hgb urine dipstick     NEGATIVE TRACE (A)  Bilirubin Urine     NEGATIVE NEGATIVE  Ketones, ur     NEGATIVE mg/dL NEGATIVE  Protein     NEGATIVE mg/dL NEGATIVE  Nitrite     NEGATIVE NEGATIVE  Leukocytes,Ua     NEGATIVE SMALL (A)  Squamous Epithelial / LPF     0 - 5 0-5  WBC, UA     0 - 5 WBC/hpf >50  RBC / HPF     0 - 5 RBC/hpf 6-10  Bacteria, UA     NONE SEEN FEW (A)  WBC Casts, UA      PRESENT  I have reviewed the labs.   Pertinent Imaging: Results for YOCELYN, BROCIOUS (MRN [id number]) as of 12/24/2020 15:18  Ref. Range 12/24/2020 15:10  Scan Result Unknown 84m    Assessment & Plan:    1. rUTI's - criteria for recurrent UTI has been met with 2 or more infections in 6 months or 3 or greater infections in one year  - patient is instructed to increase their water intake until the urine is pale yellow or clear (10 to 12 cups daily)  - patient is instructed to take probiotics (yogurt, oral pills or vaginal suppositories), take cranberry pills or drink the juice and D-mannose - avoid soaking in tubs and wipe front to back after urinating  - patient has to sleep in depends as she cannot ambulate to the restroom at night, this makes perineal care  difficult for her -she would like to consider placement of a SPT in the future -order RUS to rule out nidus for infections -UA is grossly infected -urine sent for culture -start Septra DS, BID x 7 days  -continue premarin cream three nights weekly  2. Incontinence -managed with depends  3. OAB -continue oxybutynin XL 15 mg daily  Return in about 1 month (around 01/23/2021) for recheck .  These notes generated with voice recognition software. I apologize for typographical errors.  Zara Council, PA-C  [facility] [address]  [place], [place] [address] [phone]

## 2020-12-24 ENCOUNTER — Encounter: Payer: Self-pay | Admitting: Urology

## 2020-12-24 ENCOUNTER — Other Ambulatory Visit
Admission: RE | Admit: 2020-12-24 | Discharge: 2020-12-24 | Disposition: A | Discharge status: Home / Self Care | Payer: Medicare Other | Attending: Urology | Admitting: Urology

## 2020-12-24 ENCOUNTER — Ambulatory Visit: Payer: Medicare Other | Admitting: Urology

## 2020-12-24 ENCOUNTER — Other Ambulatory Visit: Payer: Self-pay

## 2020-12-24 VITALS — BP 105/75 | HR 97 | Ht 65.0 in | Wt 180.0 lb

## 2020-12-24 DIAGNOSIS — R32 Unspecified urinary incontinence: Secondary | ICD-10-CM | POA: Insufficient documentation

## 2020-12-24 DIAGNOSIS — N952 Postmenopausal atrophic vaginitis: Secondary | ICD-10-CM

## 2020-12-24 DIAGNOSIS — N390 Urinary tract infection, site not specified: Secondary | ICD-10-CM

## 2020-12-24 LAB — URINALYSIS, COMPLETE (UACMP) WITH MICROSCOPIC
Bilirubin Urine: NEGATIVE
Glucose, UA: NEGATIVE mg/dL
Ketones, ur: NEGATIVE mg/dL
Nitrite: NEGATIVE
Protein, ur: NEGATIVE mg/dL
Specific Gravity, Urine: 1.01 (ref 1.005–1.030)
WBC, UA: >50 WBC/hpf (ref 0–5)
pH: 6 (ref 5.0–8.0)

## 2020-12-24 LAB — BLADDER SCAN AMB NON-IMAGING

## 2020-12-24 MED ORDER — SULFAMETHOXAZOLE-TRIMETHOPRIM 800-160 MG PO TABS: 1.0000 | ORAL_TABLET | Freq: Two times a day (BID) | ORAL | 0 refills | Status: DC

## 2020-12-24 MED ORDER — PREMARIN 0.625 MG/GM VA CREA: TOPICAL_CREAM | VAGINAL | 12 refills | Status: DC

## 2020-12-24 NOTE — Patient Instructions (Signed)
  You are given a sample of vaginal estrogen cream Premarin and instructed to apply 0.5mg  (pea-sized amount)  just inside the vaginal introitus with a finger-tip nightly for two weeks and then on Monday, Wednesday and Friday nights,

## 2020-12-25 LAB — URINE CULTURE: Culture: <10000 — AB

## 2021-01-21 ENCOUNTER — Ambulatory Visit: Payer: Medicare Other | Admitting: Urology

## 2021-02-16 ENCOUNTER — Other Ambulatory Visit: Payer: Self-pay | Admitting: Urology

## 2021-10-04 IMAGING — CR DG CHEST 2V
3 series · 3 of 3 positions shown · non-contrast
Comparison: 01/25/2014.

CLINICAL DATA: Shortness of breath and cough for 7 days. Nasal
congestion 1 week ago. Persistent body aches.

EXAM:
CHEST - 2 VIEW

[chest pa]
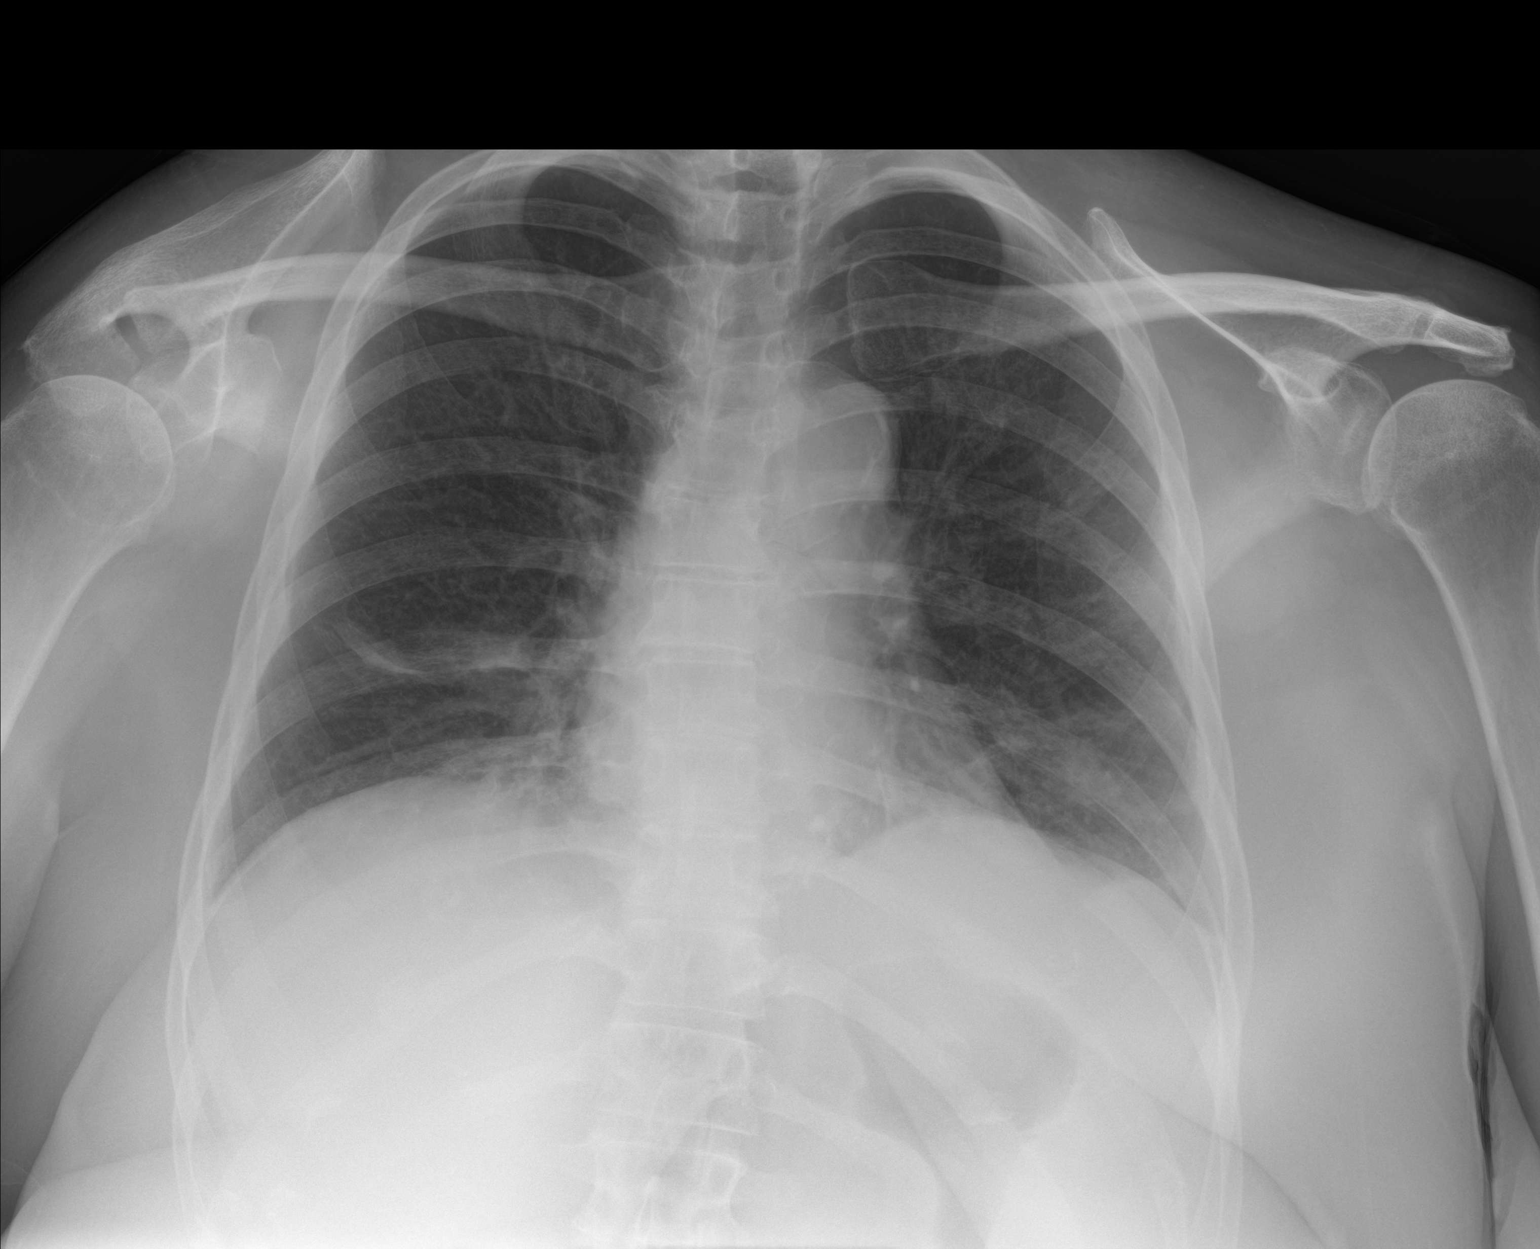

[chest lat]
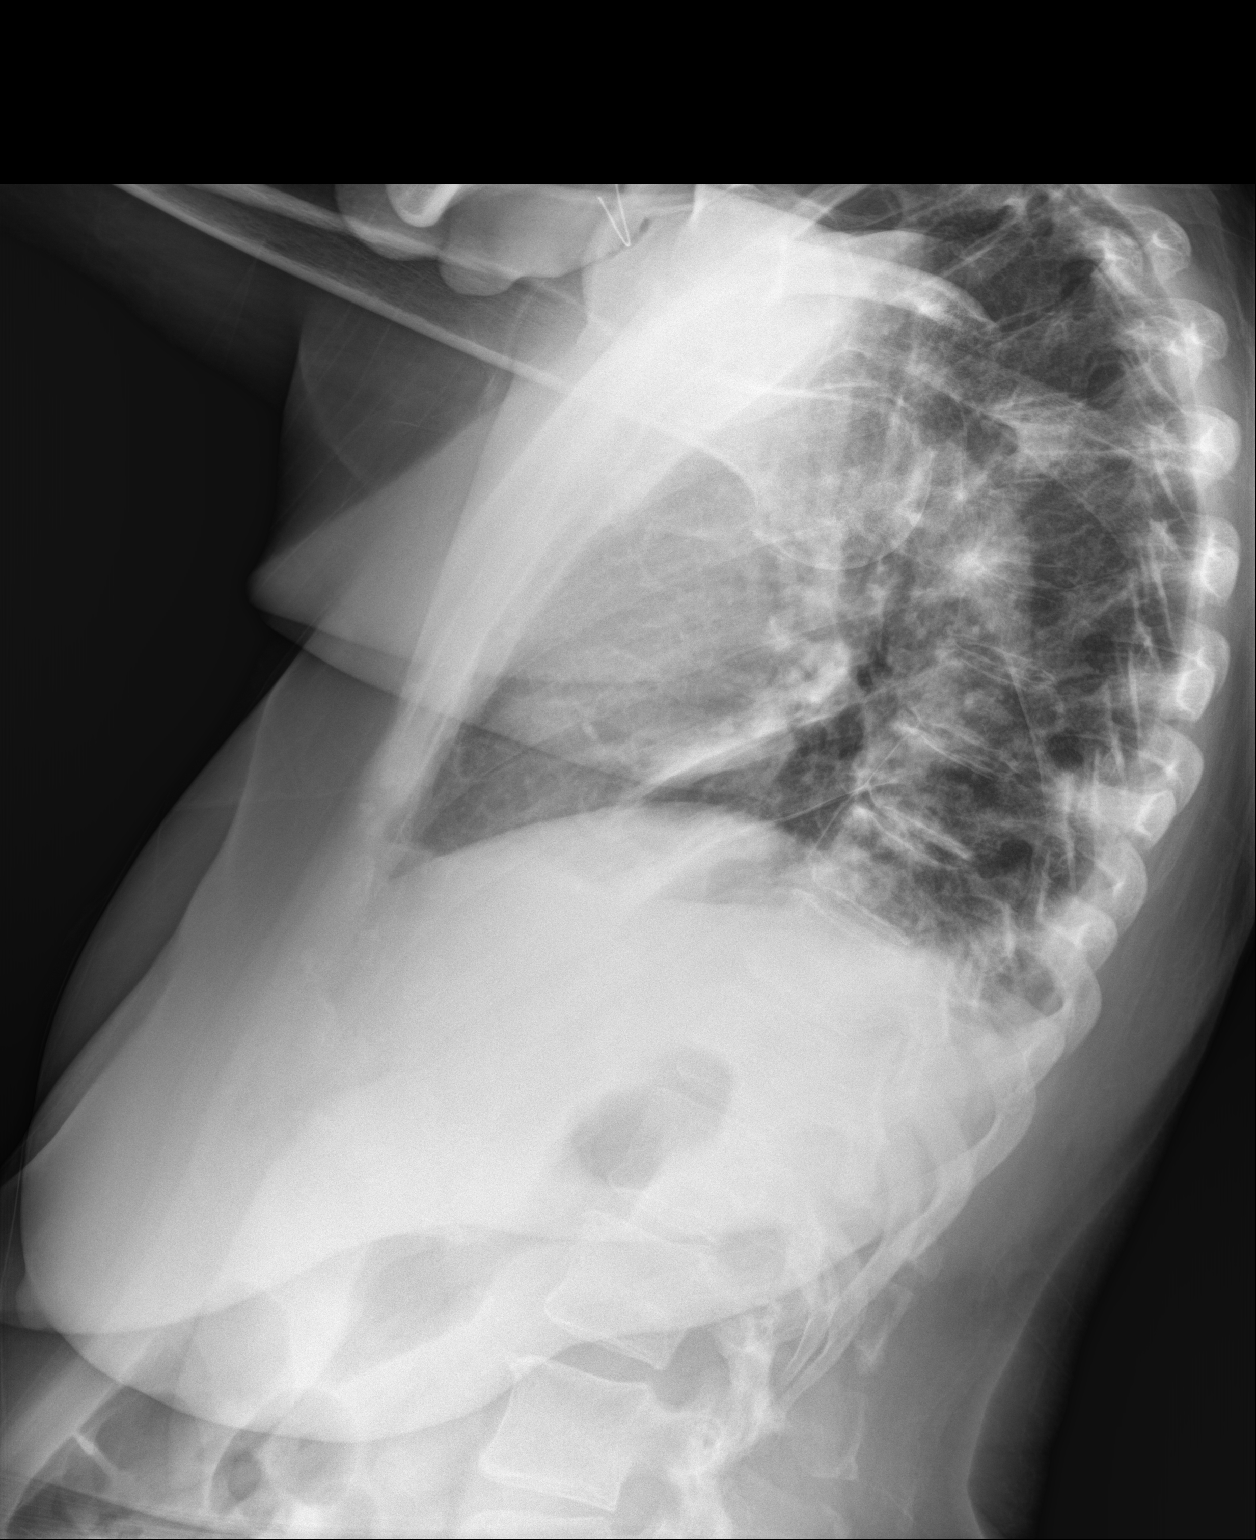

[chest ap]
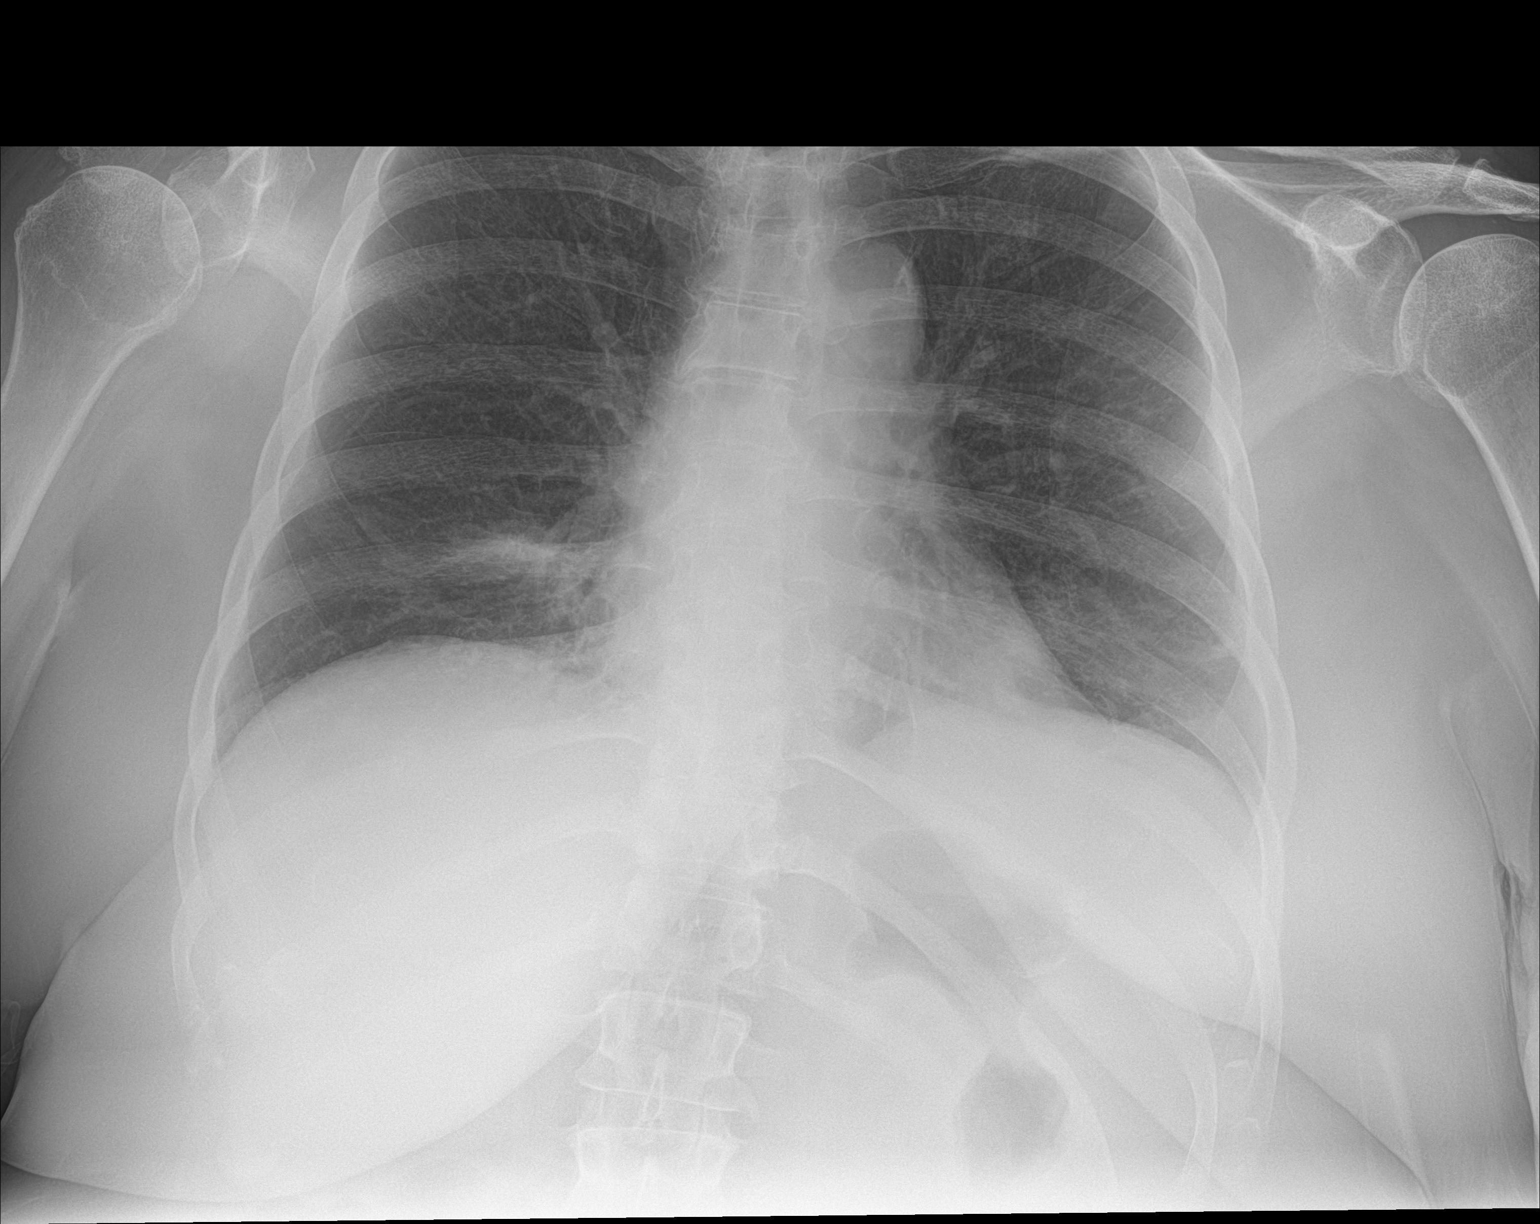

[3 of 3 positions shown; findings below may reference images not displayed]

FINDINGS: Trachea is midline. Heart size within normal limits. Atherosclerotic
calcification of the aorta. Lungs are somewhat low in volume with
mixed streaky and patchy opacities at the lung bases. Suspect trace
bilateral pleural effusions.
IMPRESSION: 1. Low lung volumes with bibasilar streaky and patchy airspace
opacities which may be due to atelectasis or pneumonia, including
due to LCDHO-G8.
2. Trace bilateral pleural effusions.
3.  Aortic atherosclerosis (JKOAR-[id number].A).

## 2022-06-15 ENCOUNTER — Emergency Department: Payer: Medicare Other

## 2022-06-15 ENCOUNTER — Other Ambulatory Visit: Payer: Self-pay

## 2022-06-15 ENCOUNTER — Emergency Department
Admission: EM | Admit: 2022-06-15 | Discharge: 2022-06-15 | Disposition: A | Discharge status: Home / Self Care | Payer: Medicare Other | Attending: Emergency Medicine | Admitting: Emergency Medicine

## 2022-06-15 DIAGNOSIS — N938 Other specified abnormal uterine and vaginal bleeding: Secondary | ICD-10-CM | POA: Diagnosis not present

## 2022-06-15 DIAGNOSIS — R2243 Localized swelling, mass and lump, lower limb, bilateral: Secondary | ICD-10-CM | POA: Diagnosis present

## 2022-06-15 DIAGNOSIS — R0600 Dyspnea, unspecified: Secondary | ICD-10-CM | POA: Insufficient documentation

## 2022-06-15 DIAGNOSIS — R0602 Shortness of breath: Secondary | ICD-10-CM | POA: Insufficient documentation

## 2022-06-15 DIAGNOSIS — E039 Hypothyroidism, unspecified: Secondary | ICD-10-CM | POA: Diagnosis not present

## 2022-06-15 LAB — BRAIN NATRIURETIC PEPTIDE: B Natriuretic Peptide: 70.4 pg/mL (ref 0.0–100.0)

## 2022-06-15 LAB — COMPREHENSIVE METABOLIC PANEL
ALT: 18 U/L (ref 0–44)
AST: 25 U/L (ref 15–41)
Albumin: 4.3 g/dL (ref 3.5–5.0)
Alkaline Phosphatase: 61 U/L (ref 38–126)
Anion gap: 11 (ref 5–15)
BUN: 14 mg/dL (ref 6–20)
CO2: 28 mmol/L (ref 22–32)
Calcium: 9.7 mg/dL (ref 8.9–10.3)
Chloride: 98 mmol/L (ref 98–111)
Creatinine, Ser: <0.3 mg/dL — ABNORMAL LOW (ref 0.44–1.00)
Glucose, Bld: 94 mg/dL (ref 70–99)
Potassium: 3.9 mmol/L (ref 3.5–5.1)
Sodium: 137 mmol/L (ref 135–145)
Total Bilirubin: 0.9 mg/dL (ref 0.3–1.2)
Total Protein: 7.6 g/dL (ref 6.5–8.1)

## 2022-06-15 LAB — CBC
HCT: 42.8 % (ref 36.0–46.0)
Hemoglobin: 13.6 g/dL (ref 12.0–15.0)
MCH: 30.9 pg (ref 26.0–34.0)
MCHC: 31.8 g/dL (ref 30.0–36.0)
MCV: 97.3 fL (ref 80.0–100.0)
Platelets: 251 10*3/uL (ref 150–400)
RBC: 4.4 MIL/uL (ref 3.87–5.11)
RDW: 12.1 % (ref 11.5–15.5)
WBC: 6.6 10*3/uL (ref 4.0–10.5)
nRBC: 0 % (ref 0.0–0.2)

## 2022-06-15 LAB — TROPONIN I (HIGH SENSITIVITY): Troponin I (High Sensitivity): 6 ng/L (ref <18)

## 2022-06-15 MED ORDER — MEDICAL COMPRESSION STOCKINGS MISC: 0 refills | Status: DC

## 2022-06-15 NOTE — ED Provider Notes (Signed)
[facility] Provider Note    Event Date/Time   First MD Initiated Contact with Patient 06/15/22 1135     (approximate)  History   Chief Complaint: Shortness of Breath and Leg Swelling  HPI  [name] is a 62 y.o. female with a past medical history of muscular dystrophy, hypothyroidism, depression, presents to the emergency department with multiple complaints.  According to the patient she has had muscular dystrophy diagnosed since she was 62 years old, she is gone to the point where she is now mostly bedbound but she will use an electric scooter to get around at times.  Patient states over the past several weeks she has had worsening swelling in both of her feet.  States she has had a history of swelling in the feet previously but it has been worse over the past couple weeks.  States since 2019 she has been experiencing shortness of breath especially when she lies down.  Denies any new changes but feels like the shortness of breath could be getting progressively worse which she attributes to the muscular dystrophy.  Patient denies any chest pain now or at any point.  Patient also states for the past several years she has been experiencing intermittent vaginal bleeding.  Patient states she had an ultrasound initially when this started but has not had one for quite some time.  Patient states she did not want this evaluated in depth because if this was cancer related she would not want to be treated regardless, however the patient is now agreeable to further workup including an ultrasound today.  Physical Exam   Triage Vital Signs: ED Triage Vitals  Enc Vitals Group     BP 06/15/22 1200 112/84     Pulse Rate 06/15/22 1200 95     Resp 06/15/22 1200 (!) 24     Temp 06/15/22 1200 (!) 97.5 F (36.4 C)     Temp Source 06/15/22 1200 Oral     SpO2 06/15/22 1200 100 %     Weight 06/15/22 1201 200 lb (90.7 kg)     Height 06/15/22 1201 5\' 5"  (1.651 m)     Head  Circumference --      Peak Flow --      Pain Score 06/15/22 1201 0     Pain Loc --      Pain Edu? --      Excl. in GC? --     Most recent vital signs: Vitals:   06/15/22 1200  BP: 112/84  Pulse: 95  Resp: (!) 24  Temp: (!) 97.5 F (36.4 C)  SpO2: 100%    General: Awake, no distress.  CV:  Good peripheral perfusion.  Regular rate and rhythm  Resp:  Normal effort.  Equal breath sounds bilaterally.  Abd:  No distention.  Soft, nontender.  No rebound or guarding. Other:  Patient has moderate pedal edema bilaterally.  No calf tenderness no erythema.   ED Results / Procedures / Treatments   EKG  EKG viewed and interpreted by myself shows what appears to be a sinus rhythm at 98 bpm with occasional PVCs.  Slightly widened QRS, left axis deviation, nonspecific ST changes significant electrical interference.  Attempted multiple times to obtain an EKG if this is the best result we were able to get.  RADIOLOGY  I reviewed and interpreted chest x-ray images.  No consolidation seen on my evaluation. Radiology has read the x-ray as low lung volumes but no acute consolidation  or abnormality.   MEDICATIONS ORDERED IN ED: Medications - No data to display   IMPRESSION / MDM / ASSESSMENT AND PLAN / ED COURSE  I reviewed the triage vital signs and the nursing notes.  Patient's presentation is most consistent with acute presentation with potential threat to life or bodily function.  Patient presents emergency department multiple complaints, several years of progressively worsening shortness of breath, increased lower extremity edema recently, continued vaginal bleeding for the past 2+ years, generalized fatigue.  Differential is quite broad but would include anemia, oncologic process, metabolic or electrolyte abnormality, CHF, pulmonary edema, DVT, peripheral edema.  We will check labs including cardiac enzymes and a BNP, will obtain a chest x-ray will obtain ultrasounds of the lower  extremities as well as a pelvic ultrasound.  Patient agreeable to plan of care.  Patient's workup is overall reassuring, chest x-ray is clear, ultrasounds are negative for DVT.  Pelvic ultrasound shows uniform thickening but no focal thickening.  Patient states she will follow-up with OB/GYN, lab work is reassuring chemistry is normal, troponin negative, BNP is normal CBC is normal.  Given the patient's pedal edema we will prescribe compression stockings for the patient to wear.  She has a follow-up appoint with her PCP on 06/17/2022.  Patient will also follow-up with OB/GYN we will refer to our local OB/GYN for the patient.  FINAL CLINICAL IMPRESSION(S) / ED DIAGNOSES   Weakness Dyspnea Vaginal bleeding   Note:  This document was prepared using Dragon voice recognition software and may include unintentional dictation errors.   Minna Antis, MD 06/15/22 1550

## 2022-06-15 NOTE — ED Triage Notes (Signed)
Per EMS report, patient lives at home with father. Patient has c/o increased shortness of breath that worsens when lying down. Patient is not on O2 at home. Patient  has a history of muscular dystrophy . Patient also c/o increased swelling in bilateral lower extremities and has had post-menopausal vaginal bleeding on and off. EMS has noted recurrent PVCs on strip and patient had an end tidal CO2 of 50 en route.

## 2022-06-15 NOTE — Discharge Instructions (Addendum)
As we discussed please follow-up with OB/GYN regarding your vaginal bleeding.  Your workup in the emergency department showed overall reassuring results.  Please wear compression stockings during the day to help with lower extremity edema.  Follow-up with your doctor soon as possible for recheck/reevaluation.  Return to the emergency department for any symptom personally concerning to yourself.

## 2022-07-05 ENCOUNTER — Emergency Department: Payer: Medicare Other

## 2022-07-05 ENCOUNTER — Emergency Department
Admission: EM | Admit: 2022-07-05 | Discharge: 2022-07-05 | Disposition: A | Discharge status: Home / Self Care | Payer: Medicare Other | Attending: Emergency Medicine | Admitting: Emergency Medicine

## 2022-07-05 DIAGNOSIS — R531 Weakness: Secondary | ICD-10-CM

## 2022-07-05 LAB — CBC WITH DIFFERENTIAL/PLATELET
Abs Immature Granulocytes: 0.02 10*3/uL (ref 0.00–0.07)
Basophils Absolute: 0 10*3/uL (ref 0.0–0.1)
Basophils Relative: 0 %
Eosinophils Absolute: 0 10*3/uL (ref 0.0–0.5)
Eosinophils Relative: 1 %
HCT: 43.4 % (ref 36.0–46.0)
Hemoglobin: 13.8 g/dL (ref 12.0–15.0)
Immature Granulocytes: 0 %
Lymphocytes Relative: 24 %
Lymphs Abs: 1.3 10*3/uL (ref 0.7–4.0)
MCH: 31.1 pg (ref 26.0–34.0)
MCHC: 31.8 g/dL (ref 30.0–36.0)
MCV: 97.7 fL (ref 80.0–100.0)
Monocytes Absolute: 0.4 10*3/uL (ref 0.1–1.0)
Monocytes Relative: 8 %
Neutro Abs: 3.6 10*3/uL (ref 1.7–7.7)
Neutrophils Relative %: 67 %
Platelets: 250 10*3/uL (ref 150–400)
RBC: 4.44 MIL/uL (ref 3.87–5.11)
RDW: 12 % (ref 11.5–15.5)
WBC: 5.4 10*3/uL (ref 4.0–10.5)
nRBC: 0 % (ref 0.0–0.2)

## 2022-07-05 LAB — COMPREHENSIVE METABOLIC PANEL
ALT: 18 U/L (ref 0–44)
AST: 22 U/L (ref 15–41)
Albumin: 4.7 g/dL (ref 3.5–5.0)
Alkaline Phosphatase: 59 U/L (ref 38–126)
Anion gap: 8 (ref 5–15)
BUN: 13 mg/dL (ref 6–20)
CO2: 31 mmol/L (ref 22–32)
Calcium: 9.3 mg/dL (ref 8.9–10.3)
Chloride: 98 mmol/L (ref 98–111)
Creatinine, Ser: <0.3 mg/dL — ABNORMAL LOW (ref 0.44–1.00)
Glucose, Bld: 105 mg/dL — ABNORMAL HIGH (ref 70–99)
Potassium: 3.9 mmol/L (ref 3.5–5.1)
Sodium: 137 mmol/L (ref 135–145)
Total Bilirubin: 1 mg/dL (ref 0.3–1.2)
Total Protein: 7.4 g/dL (ref 6.5–8.1)

## 2022-07-05 LAB — MAGNESIUM: Magnesium: 2.3 mg/dL (ref 1.7–2.4)

## 2022-07-05 NOTE — ED Provider Notes (Signed)
[facility] Provider Note    Event Date/Time   First MD Initiated Contact with Patient 07/05/22 1212     (approximate)   History   Weakness   HPI  [name] is a 62 y.o. female with history of muscular dystrophy presenting to the emergency department for evaluation of weakness.  Patient reports that she has had progressive muscular dystrophy symptoms for a long time.  She really is bedbound or uses an Art gallery manager.  Today she got into her shower and is usually able to get out on her own, was unable to do so.  She lives with her dad, but he is elderly and was unable to help her.  In the setting of her weakness, she presents to the ER for further evaluation.  She was seen in our ER on 06/15/2022.  At that time, she had workup for shortness of breath with, vaginal bleeding, and lower extremity swelling.  Her lab work including CBC, troponin, BNP, and CBC were overall reassuring.  She has follow-up with her primary care doctor in 2 days.     Physical Exam   Triage Vital Signs: ED Triage Vitals  Enc Vitals Group     BP --      Pulse --      Resp 07/05/22 1216 20     Temp --      Temp src --      SpO2 07/05/22 1216 96 %     Weight --      Height --      Head Circumference --      Peak Flow --      Pain Score 07/05/22 1215 0     Pain Loc --      Pain Edu? --      Excl. in GC? --     Most recent vital signs: Vitals:   07/05/22 1216 07/05/22 1224  BP:  117/87  Pulse:  92  Resp: 20 20  Temp:  98.2 F (36.8 C)  SpO2: 96% 98%     General: Awake, interactive  CV:  Regular rate, good peripheral perfusion.  Resp:  Lungs clear, unlabored respirations.  Abd:  Soft, nondistended.  Neuro:  No gross facial asymmetry, fluent speech, symmetric weakness of her extremities.  She is able to wiggle her toes, but limited plantar and dorsiflexion and hip flexion.   ED Results / Procedures / Treatments   Labs (all labs ordered are listed, but  only abnormal results are displayed) Labs Reviewed  COMPREHENSIVE METABOLIC PANEL - Abnormal; Notable for the following components:      Result Value   Glucose, Bld 105 (*)    Creatinine, Ser <0.30 (*)    All other components within normal limits  CBC WITH DIFFERENTIAL/PLATELET  MAGNESIUM     EKG EKG independently reviewed interpreted by myself (ER attending) demonstrates:  EKG demonstrates sinus rhythm at a rate of 90, PR 150, QRS 102, QTc 467, no acute ST changes  RADIOLOGY Imaging independently reviewed and interpreted by myself demonstrates:  Chest x-Nickia Boesen without evidence of pneumonia or pneumothorax  PROCEDURES:  Critical Care performed: No  Procedures   MEDICATIONS ORDERED IN ED: Medications - No data to display   IMPRESSION / MDM / ASSESSMENT AND PLAN / ED COURSE  I reviewed the triage vital signs and the nursing notes.  Differential diagnosis includes, but is not limited to, anemia, electrolyte abnormality, progressive muscular dystrophy symptoms, arrhythmia  Patient's presentation is  most consistent with acute presentation with potential threat to life or bodily function.  62 year old female presenting to the emergency department for evaluation of weakness.  No focal findings on exam, does have generalized weakness consistent with her history of muscular dystrophy. Her workup here is overall reassuring.  Discussed with patient that this may be part of her progressive disease.  She expressed understanding.  She understands that she may require placement at some point, but would like to be at home as long as possible and does feel that she can be safely discharged today.  Strict return precautions provided.  Patient discharged stable condition.       FINAL CLINICAL IMPRESSION(S) / ED DIAGNOSES   Final diagnoses:  Generalized weakness     Rx / DC Orders   ED Discharge Orders     None        Note:  This document was prepared using Dragon voice  recognition software and may include unintentional dictation errors.   Trinna Post, MD 07/05/22 1435

## 2022-07-05 NOTE — Discharge Instructions (Addendum)
You were seen in the emergency department today for evaluation of your weakness. Your lab work, EKG, and chest x-[name] were overall reassuring.  I suspect that your weakness may be related to worsening muscular dystrophy.  Please keep your scheduled follow-up with your primary care doctor in 2 days.  Return to the ER for new or worsening symptoms.

## 2022-07-05 NOTE — ED Triage Notes (Signed)
Pt presents to the ED due to increase weakness that started this morning. Patient was unable to get herself out of the tub. Pt has a hx muscular dystrophy. Pt A&Ox4

## 2022-07-16 ENCOUNTER — Telehealth: Payer: Self-pay

## 2022-07-16 NOTE — Telephone Encounter (Signed)
TC to [facility] to confirm if patient is under their services, per pcp note. Genevieve Norlander confirmed they have patient open for hospice services.  PC referral will be cancelled.

## 2022-08-06 ENCOUNTER — Emergency Department: Payer: Medicare Other

## 2022-08-06 ENCOUNTER — Encounter: Payer: Self-pay | Admitting: Internal Medicine

## 2022-08-06 ENCOUNTER — Inpatient Hospital Stay
Admission: EM | Admit: 2022-08-06 | Discharge: 2022-08-12 | DRG: 177 | Disposition: A | Discharge status: Skilled Nursing Facility | Payer: Medicare Other | Attending: Student in an Organized Health Care Education/Training Program | Admitting: Student in an Organized Health Care Education/Training Program

## 2022-08-06 ENCOUNTER — Other Ambulatory Visit: Payer: Self-pay

## 2022-08-06 DIAGNOSIS — T17308A Unspecified foreign body in larynx causing other injury, initial encounter: Secondary | ICD-10-CM | POA: Diagnosis not present

## 2022-08-06 DIAGNOSIS — G7100 Muscular dystrophy, unspecified: Secondary | ICD-10-CM | POA: Diagnosis present

## 2022-08-06 DIAGNOSIS — Z823 Family history of stroke: Secondary | ICD-10-CM

## 2022-08-06 DIAGNOSIS — E063 Autoimmune thyroiditis: Secondary | ICD-10-CM | POA: Diagnosis present

## 2022-08-06 DIAGNOSIS — R0603 Acute respiratory distress: Principal | ICD-10-CM

## 2022-08-06 DIAGNOSIS — J9601 Acute respiratory failure with hypoxia: Secondary | ICD-10-CM | POA: Diagnosis present

## 2022-08-06 DIAGNOSIS — G7102 Facioscapulohumeral muscular dystrophy: Secondary | ICD-10-CM | POA: Diagnosis present

## 2022-08-06 DIAGNOSIS — G7109 Other specified muscular dystrophies: Secondary | ICD-10-CM | POA: Diagnosis present

## 2022-08-06 DIAGNOSIS — J690 Pneumonitis due to inhalation of food and vomit: Secondary | ICD-10-CM | POA: Diagnosis not present

## 2022-08-06 DIAGNOSIS — K5900 Constipation, unspecified: Secondary | ICD-10-CM | POA: Diagnosis not present

## 2022-08-06 DIAGNOSIS — R600 Localized edema: Secondary | ICD-10-CM | POA: Diagnosis present

## 2022-08-06 DIAGNOSIS — T17908A Unspecified foreign body in respiratory tract, part unspecified causing other injury, initial encounter: Secondary | ICD-10-CM

## 2022-08-06 DIAGNOSIS — G4730 Sleep apnea, unspecified: Secondary | ICD-10-CM | POA: Diagnosis present

## 2022-08-06 DIAGNOSIS — R6510 Systemic inflammatory response syndrome (SIRS) of non-infectious origin without acute organ dysfunction: Secondary | ICD-10-CM | POA: Diagnosis present

## 2022-08-06 DIAGNOSIS — Z8249 Family history of ischemic heart disease and other diseases of the circulatory system: Secondary | ICD-10-CM

## 2022-08-06 DIAGNOSIS — Z79890 Hormone replacement therapy (postmenopausal): Secondary | ICD-10-CM

## 2022-08-06 DIAGNOSIS — F32A Depression, unspecified: Secondary | ICD-10-CM | POA: Diagnosis present

## 2022-08-06 DIAGNOSIS — Z96651 Presence of right artificial knee joint: Secondary | ICD-10-CM | POA: Diagnosis present

## 2022-08-06 DIAGNOSIS — K5909 Other constipation: Secondary | ICD-10-CM | POA: Diagnosis present

## 2022-08-06 DIAGNOSIS — Z66 Do not resuscitate: Secondary | ICD-10-CM | POA: Diagnosis present

## 2022-08-06 DIAGNOSIS — Z79899 Other long term (current) drug therapy: Secondary | ICD-10-CM

## 2022-08-06 DIAGNOSIS — Z820 Family history of epilepsy and other diseases of the nervous system: Secondary | ICD-10-CM

## 2022-08-06 DIAGNOSIS — Z8349 Family history of other endocrine, nutritional and metabolic diseases: Secondary | ICD-10-CM

## 2022-08-06 DIAGNOSIS — R1310 Dysphagia, unspecified: Secondary | ICD-10-CM | POA: Diagnosis present

## 2022-08-06 DIAGNOSIS — Z7401 Bed confinement status: Secondary | ICD-10-CM

## 2022-08-06 DIAGNOSIS — F40240 Claustrophobia: Secondary | ICD-10-CM | POA: Diagnosis present

## 2022-08-06 DIAGNOSIS — Z8049 Family history of malignant neoplasm of other genital organs: Secondary | ICD-10-CM

## 2022-08-06 DIAGNOSIS — E039 Hypothyroidism, unspecified: Secondary | ICD-10-CM | POA: Diagnosis present

## 2022-08-06 DIAGNOSIS — E785 Hyperlipidemia, unspecified: Secondary | ICD-10-CM | POA: Diagnosis present

## 2022-08-06 LAB — CBC WITH DIFFERENTIAL/PLATELET
Abs Immature Granulocytes: 0.03 10*3/uL (ref 0.00–0.07)
Basophils Absolute: 0 10*3/uL (ref 0.0–0.1)
Basophils Relative: 0 %
Eosinophils Absolute: 0 10*3/uL (ref 0.0–0.5)
Eosinophils Relative: 0 %
HCT: 42.6 % (ref 36.0–46.0)
Hemoglobin: 13.8 g/dL (ref 12.0–15.0)
Immature Granulocytes: 0 %
Lymphocytes Relative: 17 %
Lymphs Abs: 1.5 10*3/uL (ref 0.7–4.0)
MCH: 31.5 pg (ref 26.0–34.0)
MCHC: 32.4 g/dL (ref 30.0–36.0)
MCV: 97.3 fL (ref 80.0–100.0)
Monocytes Absolute: 0.7 10*3/uL (ref 0.1–1.0)
Monocytes Relative: 8 %
Neutro Abs: 6.5 10*3/uL (ref 1.7–7.7)
Neutrophils Relative %: 75 %
Platelets: 288 10*3/uL (ref 150–400)
RBC: 4.38 MIL/uL (ref 3.87–5.11)
RDW: 12.1 % (ref 11.5–15.5)
WBC: 8.8 10*3/uL (ref 4.0–10.5)
nRBC: 0 % (ref 0.0–0.2)

## 2022-08-06 LAB — COMPREHENSIVE METABOLIC PANEL
ALT: 24 U/L (ref 0–44)
AST: 31 U/L (ref 15–41)
Albumin: 4.8 g/dL (ref 3.5–5.0)
Alkaline Phosphatase: 58 U/L (ref 38–126)
Anion gap: 12 (ref 5–15)
BUN: 12 mg/dL (ref 6–20)
CO2: 29 mmol/L (ref 22–32)
Calcium: 9.5 mg/dL (ref 8.9–10.3)
Chloride: 95 mmol/L — ABNORMAL LOW (ref 98–111)
Creatinine, Ser: <0.3 mg/dL — ABNORMAL LOW (ref 0.44–1.00)
Glucose, Bld: 85 mg/dL (ref 70–99)
Potassium: 4.1 mmol/L (ref 3.5–5.1)
Sodium: 136 mmol/L (ref 135–145)
Total Bilirubin: 1.4 mg/dL — ABNORMAL HIGH (ref 0.3–1.2)
Total Protein: 7.9 g/dL (ref 6.5–8.1)

## 2022-08-06 LAB — URINALYSIS, W/ REFLEX TO CULTURE (INFECTION SUSPECTED)
Bilirubin Urine: NEGATIVE
Glucose, UA: NEGATIVE mg/dL
Hgb urine dipstick: NEGATIVE
Ketones, ur: 20 mg/dL — AB
Leukocytes,Ua: NEGATIVE
Nitrite: NEGATIVE
Protein, ur: NEGATIVE mg/dL
Specific Gravity, Urine: 1.008 (ref 1.005–1.030)
pH: 6 (ref 5.0–8.0)

## 2022-08-06 LAB — LACTIC ACID, PLASMA
Lactic Acid, Venous: 1.1 mmol/L (ref 0.5–1.9)
Lactic Acid, Venous: 0.7 mmol/L (ref 0.5–1.9)

## 2022-08-06 LAB — BRAIN NATRIURETIC PEPTIDE: B Natriuretic Peptide: 36.8 pg/mL (ref 0.0–100.0)

## 2022-08-06 LAB — TROPONIN I (HIGH SENSITIVITY)
Troponin I (High Sensitivity): 8 ng/L (ref <18)
Troponin I (High Sensitivity): 6 ng/L (ref <18)

## 2022-08-06 LAB — BLOOD GAS, VENOUS
Acid-Base Excess: 4.7 mmol/L — ABNORMAL HIGH (ref 0.0–2.0)
Patient temperature: 37
pH, Ven: 7.35 (ref 7.25–7.43)

## 2022-08-06 LAB — D-DIMER, QUANTITATIVE: D-Dimer, Quant: 0.33 ug/mL-FEU (ref 0.00–0.50)

## 2022-08-06 MED ORDER — MORPHINE SULFATE (PF) 2 MG/ML IV SOLN
2.0000 mg | Freq: Once | INTRAVENOUS | Status: AC
  Administered 2022-08-06: 2 mg via INTRAVENOUS
  Filled 2022-08-06: qty 1

## 2022-08-06 MED ORDER — SODIUM CHLORIDE 0.9 % IV SOLN
500.0000 mg | INTRAVENOUS | Status: DC
  Administered 2022-08-06: 500 mg via INTRAVENOUS
  Filled 2022-08-06 (×3): qty 5

## 2022-08-06 MED ORDER — ACETAMINOPHEN 650 MG RE SUPP: 650.0000 mg | Freq: Four times a day (QID) | RECTAL | Status: DC | PRN

## 2022-08-06 MED ORDER — SODIUM CHLORIDE 0.9 % IV SOLN
2.0000 g | INTRAVENOUS | Status: AC
  Administered 2022-08-06 – 2022-08-10 (×5): 2 g via INTRAVENOUS
  Filled 2022-08-06 (×6): qty 20

## 2022-08-06 MED ORDER — HEPARIN SODIUM (PORCINE) 5000 UNIT/ML IJ SOLN
5000.0000 [IU] | Freq: Three times a day (TID) | INTRAMUSCULAR | Status: DC
  Administered 2022-08-06 – 2022-08-12 (×18): 5000 [IU] via SUBCUTANEOUS
  Filled 2022-08-06 (×18): qty 1

## 2022-08-06 MED ORDER — LEVOTHYROXINE SODIUM 112 MCG PO TABS
112.0000 ug | ORAL_TABLET | Freq: Every day | ORAL | Status: DC
  Administered 2022-08-07 – 2022-08-08 (×2): 112 ug via ORAL
  Filled 2022-08-06 (×2): qty 1

## 2022-08-06 MED ORDER — VENLAFAXINE HCL ER 75 MG PO CP24
75.0000 mg | ORAL_CAPSULE | Freq: Once | ORAL | Status: AC
  Administered 2022-08-06: 75 mg via ORAL
  Filled 2022-08-06 (×2): qty 1

## 2022-08-06 MED ORDER — IOHEXOL 350 MG/ML SOLN
65.0000 mL | Freq: Once | INTRAVENOUS | Status: AC | PRN
  Administered 2022-08-06: 65 mL via INTRAVENOUS

## 2022-08-06 MED ORDER — SODIUM CHLORIDE 0.9 % IV BOLUS
250.0000 mL | Freq: Once | INTRAVENOUS | Status: AC
  Administered 2022-08-06: 250 mL via INTRAVENOUS

## 2022-08-06 MED ORDER — IOHEXOL 350 MG/ML SOLN
75.0000 mL | Freq: Once | INTRAVENOUS | Status: AC | PRN
  Administered 2022-08-06: 75 mL via INTRAVENOUS

## 2022-08-06 MED ORDER — MORPHINE SULFATE (PF) 2 MG/ML IV SOLN: 2.0000 mg | INTRAVENOUS | Status: DC | PRN

## 2022-08-06 MED ORDER — SODIUM CHLORIDE 0.9 % IV SOLN: INTRAVENOUS | Status: AC

## 2022-08-06 MED ORDER — PANTOPRAZOLE SODIUM 40 MG IV SOLR
40.0000 mg | Freq: Two times a day (BID) | INTRAVENOUS | Status: DC
  Administered 2022-08-06 – 2022-08-11 (×11): 40 mg via INTRAVENOUS
  Filled 2022-08-06 (×11): qty 10

## 2022-08-06 MED ORDER — SODIUM CHLORIDE 0.9% FLUSH
3.0000 mL | Freq: Two times a day (BID) | INTRAVENOUS | Status: DC
  Administered 2022-08-06 – 2022-08-12 (×11): 3 mL via INTRAVENOUS

## 2022-08-06 MED ORDER — ACETAMINOPHEN 325 MG PO TABS
650.0000 mg | ORAL_TABLET | Freq: Four times a day (QID) | ORAL | Status: DC | PRN
  Administered 2022-08-07 – 2022-08-12 (×9): 650 mg via ORAL
  Filled 2022-08-06 (×10): qty 2

## 2022-08-06 NOTE — Assessment & Plan Note (Signed)
Earlier today patient had a choking episode where she felt like she was choking on her own sputum and secretions a little after even eating her breakfast.  This is never happened to her before.  Patient of course became very anxious and came to the emergency room. On arrival patient had sepsis criteria and would with her choking presentation aspiration was suspected.  CTA obtained to identify PE due to high risk for pulmonary embolism from her immobility, was negative for any pneumonia pulmonary embolism or pleural effusion. Will start patient on IV PPI therapy.  Swallow evaluation/ barium swallow and or  GI consult per a.m. team as deemed appropriate.

## 2022-08-06 NOTE — ED Notes (Signed)
Patient in CT, This RN received message from CT tech, stating that patient says that if she lays flat, she will have a panic attack. Pt states that anti depressant medication will help. Pt transported back to ED from Ct to be medicated, and will return to CT later.

## 2022-08-06 NOTE — ED Notes (Signed)
Patient refuses treatment, stating that she wants nothing done until she signs a form that states that she is removed from palliative care, so that she will not be billed for visit.

## 2022-08-06 NOTE — Sepsis Progress Note (Signed)
Following for sepsis monitoring ?

## 2022-08-06 NOTE — ED Notes (Signed)
Patient consents to treatment at this time.

## 2022-08-06 NOTE — Assessment & Plan Note (Signed)
Patient is unable to move and is bedridden.  Does have a neurologist that she has to make an upcoming appointment with. Compression stockings.  Home physical therapy and anticoagulation therapy.  To prevent development of DVTs.

## 2022-08-06 NOTE — ED Notes (Signed)
Patient transported to CT 

## 2022-08-06 NOTE — H&P (Signed)
History and Physical    Patient: [name] ZOX:[id number] DOB: 01/08/1961 DOA: 08/06/2022 DOS: the patient was seen and examined on 08/07/2022 PCP: Gardiner Coins, PA-C  Patient coming from: Home  Chief Complaint:  Chief Complaint  Patient presents with   Aspiration    HPI: [name] is a 62 y.o. female with medical history significant for muscular dystrophy, hypothyroidism, depression compensated emergency room today with choking episode where she was not able to swallow her own secretions.  Patient felt like something was stuck in her throat.  This happened after she ate some eggs and toast this morning feels like that blockage that she had was more mucousy.  Patient states that she lives at home with her dad who is 75 years old and he is not able to take care of her and her husband that she was married to for 30 years left.  Patient is currently on hospice and is not sure that she wants to be on palliative care anymore patient wants to be admitted for antibiotics and treatment.  CODE STATUS is DO NOT RESUSCITATE per ED provider's note.   Review of Systems: Review of Systems  HENT:         Choking.    Neurological:  Positive for weakness.  All other systems reviewed and are negative.    Past Medical History:  Diagnosis Date   Depression    FSHD (facioscapulohumeral muscular dystrophy) (HCC)    Hashimoto's disease    Heart murmur    Hyperthyroidism    Muscular dystrophy (HCC)    Thyroid disease    Urinary incontinence    Vitamin D deficiency    Past Surgical History:  Procedure Laterality Date   CESAREAN SECTION     TOTAL KNEE ARTHROPLASTY Right    TUBAL LIGATION  1996   Bilateral   Social History:  reports that she has never smoked. She has never used smokeless tobacco. She reports that she does not currently use alcohol. She reports that she does not use drugs.  No Active Allergies   Family History  Problem Relation Age of Onset    Muscular dystrophy Mother    Stroke Mother    Cancer Mother        uterine    Hyperlipidemia Father    Muscular dystrophy Sister    Heart failure Sister    Aneurysm Brother    Breast cancer Neg Hx    Colon cancer Neg Hx    Ovarian cancer Neg Hx     Prior to Admission medications   Medication Sig Start Date End Date Taking? Authorizing Provider  conjugated estrogens (PREMARIN) vaginal cream Apply 0.5mg  (pea-sized amount)  just inside the vaginal introitus with a finger-tip on  Monday, Wednesday and Friday nights. 12/24/20   Harle Battiest, PA-C  Elastic Bandages & Supports (MEDICAL COMPRESSION STOCKINGS) MISC Please provide compression stockings 06/15/22   Minna Antis, MD  levothyroxine (SYNTHROID) 100 MCG tablet Take 112 mcg by mouth daily before breakfast.     [provider]  oxybutynin (DITROPAN XL) 15 MG 24 hr tablet Take 1 tablet (15 mg total) by mouth daily. 08/08/20   Sondra Come, MD  sulfamethoxazole-trimethoprim (BACTRIM DS) 800-160 MG tablet Take 1 tablet by mouth every 12 (twelve) hours. 12/24/20   Michiel Cowboy A, PA-C  venlafaxine XR (EFFEXOR-XR) 75 MG 24 hr capsule Take 75 mg by mouth at bedtime. 06/28/19   [provider]  traZODone (DESYREL) 50 MG  tablet Take 1 tablet (50 mg total) by mouth at bedtime. 04/18/16 06/16/19  Jimmy Footman, MD     Vitals:   08/06/22 2130 08/06/22 2204 08/06/22 2230 08/06/22 2343  BP: 114/86  106/84 110/83  Pulse: 100  100 91  Resp: (!) 23  (!) 24 16  Temp:  (!) 97.4 F (36.3 C)  98 F (36.7 C)  TempSrc:  Oral  Oral  SpO2: 100%  100% 100%  Weight:      Height:       Physical Exam Vitals and nursing note reviewed.  Constitutional:      General: She is not in acute distress. HENT:     Head: Normocephalic and atraumatic.     Right Ear: Hearing normal.     Left Ear: Hearing normal.     Nose: Nose normal. No nasal deformity.     Mouth/Throat:     Lips: Pink.     Tongue: No lesions.      Pharynx: Oropharynx is clear.  Eyes:     General: Lids are normal.     Extraocular Movements: Extraocular movements intact.  Cardiovascular:     Rate and Rhythm: Normal rate and regular rhythm.     Heart sounds: Normal heart sounds.  Pulmonary:     Effort: Pulmonary effort is normal.     Breath sounds: Normal breath sounds.  Abdominal:     General: Bowel sounds are normal. There is no distension.     Palpations: Abdomen is soft. There is no mass.     Tenderness: There is no abdominal tenderness.  Musculoskeletal:     Right lower leg: No edema.     Left lower leg: No edema.  Skin:    General: Skin is warm.  Neurological:     General: No focal deficit present.     Mental Status: She is alert and oriented to person, place, and time.     Cranial Nerves: Cranial nerves 2-12 are intact.  Psychiatric:        Attention and Perception: Attention normal.        Mood and Affect: Mood normal.        Speech: Speech normal.        Behavior: Behavior normal. Behavior is cooperative.      Labs on Admission: I have personally reviewed following labs and imaging studies  CBC: Recent Labs  Lab 08/06/22 1518  WBC 8.8  NEUTROABS 6.5  HGB 13.8  HCT 42.6  MCV 97.3  PLT 288   Basic Metabolic Panel: Recent Labs  Lab 08/06/22 1518  NA 136  K 4.1  CL 95*  CO2 29  GLUCOSE 85  BUN 12  CREATININE <0.30*  CALCIUM 9.5   GFR: CrCl cannot be calculated (This lab value cannot be used to calculate CrCl because it is not a number: <0.30). Liver Function Tests: Recent Labs  Lab 08/06/22 1518  AST 31  ALT 24  ALKPHOS 58  BILITOT 1.4*  PROT 7.9  ALBUMIN 4.8   No results for input(s): "LIPASE", "AMYLASE" in the last 168 hours. No results for input(s): "AMMONIA" in the last 168 hours. Coagulation Profile: No results for input(s): "INR", "PROTIME" in the last 168 hours. Cardiac Enzymes: No results for input(s): "CKTOTAL", "CKMB", "CKMBINDEX", "TROPONINI" in the last 168  hours. BNP (last 3 results) No results for input(s): "PROBNP" in the last 8760 hours. HbA1C: No results for input(s): "HGBA1C" in the last 72 hours. CBG: No results for input(s): "GLUCAP"  in the last 168 hours. Lipid Profile: No results for input(s): "CHOL", "HDL", "LDLCALC", "TRIG", "CHOLHDL", "LDLDIRECT" in the last 72 hours. Thyroid Function Tests: No results for input(s): "TSH", "T4TOTAL", "FREET4", "T3FREE", "THYROIDAB" in the last 72 hours. Anemia Panel: No results for input(s): "VITAMINB12", "FOLATE", "FERRITIN", "TIBC", "IRON", "RETICCTPCT" in the last 72 hours. Urine analysis:    Component Value Date/Time   COLORURINE STRAW (A) 08/06/2022 1923   APPEARANCEUR CLEAR (A) 08/06/2022 1923   APPEARANCEUR Clear 01/25/2014 1254   LABSPEC 1.008 08/06/2022 1923   LABSPEC 1.006 01/25/2014 1254   PHURINE 6.0 08/06/2022 1923   GLUCOSEU NEGATIVE 08/06/2022 1923   GLUCOSEU Negative 01/25/2014 1254   HGBUR NEGATIVE 08/06/2022 1923   BILIRUBINUR NEGATIVE 08/06/2022 1923   BILIRUBINUR neg 06/06/2015 1112   BILIRUBINUR Negative 01/25/2014 1254   KETONESUR 20 (A) 08/06/2022 1923   PROTEINUR NEGATIVE 08/06/2022 1923   UROBILINOGEN 0.2 06/06/2015 1112   NITRITE NEGATIVE 08/06/2022 1923   LEUKOCYTESUR NEGATIVE 08/06/2022 1923   LEUKOCYTESUR Negative 01/25/2014 1254    Radiological Exams on Admission: CT Angio Chest PE W/Cm &/Or Wo Cm  Result Date: 08/06/2022 CLINICAL DATA:  Shortness of breath EXAM: CT ANGIOGRAPHY CHEST WITH CONTRAST TECHNIQUE: Multidetector CT imaging of the chest was performed using the standard protocol during bolus administration of intravenous contrast. Multiplanar CT image reconstructions and MIPs were obtained to evaluate the vascular anatomy. RADIATION DOSE REDUCTION: This exam was performed according to the departmental dose-optimization program which includes automated exposure control, adjustment of the mA and/or kV according to patient size and/or use of  iterative reconstruction technique. CONTRAST:  75mL OMNIPAQUE IOHEXOL 350 MG/ML SOLN COMPARISON:  Chest x-ray 08/06/2022 FINDINGS: Cardiovascular: Satisfactory opacification of the pulmonary arteries to the segmental level. No evidence of pulmonary embolism. Mild aortic atherosclerosis. Aneurysmal dilatation of the ascending aorta measuring up to 4.3 cm. No dissection. Coronary vascular calcification. Normal cardiac size. No pericardial effusion Mediastinum/Nodes: No enlarged mediastinal, hilar, or axillary lymph nodes. Thyroid gland, trachea, and esophagus demonstrate no significant findings. Lungs/Pleura: Dependent atelectasis in the lower lobes. No consolidation, pleural effusion or pneumothorax Upper Abdomen: No acute finding Musculoskeletal: No acute osseous abnormality. Fatty atrophy of the paraspinal muscles. Review of the MIP images confirms the above findings. IMPRESSION: 1. Negative for acute pulmonary embolus. 2. Aortic atherosclerosis. Aneurysmal dilatation of ascending aorta up to 4.3 cm. Recommend annual imaging followup by CTA or MRA. This recommendation follows 2010 ACCF/AHA/AATS/ACR/ASA/SCA/SCAI/SIR/STS/SVM Guidelines for the Diagnosis and Management of Patients with Thoracic Aortic Disease. Circulation. 2010; 121: N829-F621. Aortic aneurysm NOS (ICD10-I71.9) Aortic Atherosclerosis (ICD10-I70.0). Aortic aneurysm NOS (ICD10-I71.9). Electronically Signed   By: Jasmine Pang M.D.   On: 08/06/2022 21:23   US Venous Img Lower Bilateral (DVT)  Result Date: 08/06/2022 CLINICAL DATA:  Bilateral lower extremity edema. EXAM: BILATERAL LOWER EXTREMITY VENOUS DOPPLER ULTRASOUND TECHNIQUE: Gray-scale sonography with graded compression, as well as color Doppler and duplex ultrasound were performed to evaluate the lower extremity deep venous systems from the level of the common femoral vein and including the common femoral, femoral, profunda femoral, popliteal and calf veins including the posterior tibial,  peroneal and gastrocnemius veins when visible. The superficial great saphenous vein was also interrogated. Spectral Doppler was utilized to evaluate flow at rest and with distal augmentation maneuvers in the common femoral, femoral and popliteal veins. COMPARISON:  None Available. FINDINGS: RIGHT LOWER EXTREMITY Common Femoral Vein: No evidence of thrombus. Normal compressibility, respiratory phasicity and response to augmentation. Saphenofemoral Junction: No evidence of thrombus. Normal compressibility and  flow on color Doppler imaging. Profunda Femoral Vein: No evidence of thrombus. Normal compressibility and flow on color Doppler imaging. Femoral Vein: No evidence of thrombus. Normal compressibility, respiratory phasicity and response to augmentation. Popliteal Vein: No evidence of thrombus. Normal compressibility, respiratory phasicity and response to augmentation. Calf Veins: No evidence of thrombus. Normal compressibility and flow on color Doppler imaging. Superficial Great Saphenous Vein: No evidence of thrombus. Normal compressibility. Venous Reflux:  None. Other Findings:  None. LEFT LOWER EXTREMITY Common Femoral Vein: No evidence of thrombus. Normal compressibility, respiratory phasicity and response to augmentation. Saphenofemoral Junction: No evidence of thrombus. Normal compressibility and flow on color Doppler imaging. Profunda Femoral Vein: No evidence of thrombus. Normal compressibility and flow on color Doppler imaging. Femoral Vein: No evidence of thrombus. Normal compressibility, respiratory phasicity and response to augmentation. Popliteal Vein: No evidence of thrombus. Normal compressibility, respiratory phasicity and response to augmentation. Calf Veins: No evidence of thrombus. Normal compressibility and flow on color Doppler imaging. Superficial Great Saphenous Vein: No evidence of thrombus. Normal compressibility. Venous Reflux:  None. Other Findings:  None. IMPRESSION: No evidence of deep  venous thrombosis in either lower extremity. Electronically Signed   By: Aram Candela M.D.   On: 08/06/2022 20:40   DG Chest Port 1 View  Result Date: 08/06/2022 CLINICAL DATA:  Sepsis EXAM: PORTABLE CHEST 1 VIEW COMPARISON:  X-ray 07/05/2022 FINDINGS: No consolidation, pneumothorax or effusion. No edema. Normal cardiopericardial silhouette. Calcified aorta. Overlapping cardiac leads. Degenerative changes along the spine. IMPRESSION: No acute cardiopulmonary disease. Electronically Signed   By: Karen Kays M.D.   On: 08/06/2022 14:58     Data Reviewed: Relevant notes from primary care and specialist visits, past discharge summaries as available in EHR, including Care Everywhere. Prior diagnostic testing as pertinent to current admission diagnoses Updated medications and problem lists for reconciliation ED course, including vitals, labs, imaging, treatment and response to treatment Triage notes, nursing and pharmacy notes and ED provider's notes Notable results as noted in HPI Assessment and Plan: * Choking Earlier today patient had a choking episode where she felt like she was choking on her own sputum and secretions a little after even eating her breakfast.  This is never happened to her before.  Patient of course became very anxious and came to the emergency room. On arrival patient had sepsis criteria and would with her choking presentation aspiration was suspected.  CTA obtained to identify PE due to high risk for pulmonary embolism from her immobility, was negative for any pneumonia pulmonary embolism or pleural effusion. Will start patient on IV PPI therapy.  Swallow evaluation/ barium swallow and or  GI consult per a.m. team as deemed appropriate.  SIRS (systemic inflammatory response syndrome) (HCC) On arrival initial presentation patient was tachycardic and tachypneic, presenting with mild to moderate respiratory distress with coughing and increased respiratory rate.  Bilateral  leg edema Attribute to patient's bedridden status due to her muscular dystrophy.  Patient status post bilateral venous Dopplers negative for any DVT. Due to her immobility and bedridden state patient is at high risk for DVT, will defer to PCP to initiate low-dose anticoagulation DVT prevention.  While in the hospital patient to start heparin every 8 subcu.  Compression stockings.  Hypothyroidism Will continue patient on levothyroxine at 100 mcg daily.  MUSCULAR DYSTROPHY Patient is unable to move and is bedridden.  Does have a neurologist that she has to make an upcoming appointment with. Compression stockings.  Home physical therapy and anticoagulation therapy.  To prevent development of DVTs.   DVT prophylaxis:  Heparin  Consults:  None   Advance Care Planning:    Code Status: DNR   Family Communication:  None   Disposition Plan:  Back to previous home environment  Severity of Illness: The appropriate patient status for this patient is INPATIENT. Inpatient status is judged to be reasonable and necessary in order to provide the required intensity of service to ensure the patient's safety. The patient's presenting symptoms, physical exam findings, and initial radiographic and laboratory data in the context of their chronic comorbidities is felt to place them at high risk for further clinical deterioration. Furthermore, it is not anticipated that the patient will be medically stable for discharge from the hospital within 2 midnights of admission.   * I certify that at the point of admission it is my clinical judgment that the patient will require inpatient hospital care spanning beyond 2 midnights from the point of admission due to high intensity of service, high risk for further deterioration and high frequency of surveillance required.*  Author: Gertha Calkin, MD 08/07/2022 2:40 AM  For on call review [email or web address].

## 2022-08-06 NOTE — Assessment & Plan Note (Signed)
Attribute to patient's bedridden status due to her muscular dystrophy.  Patient status post bilateral venous Dopplers negative for any DVT. Due to her immobility and bedridden state patient is at high risk for DVT, will defer to PCP to initiate low-dose anticoagulation DVT prevention.  While in the hospital patient to start heparin every 8 subcu.  Compression stockings.

## 2022-08-06 NOTE — Assessment & Plan Note (Signed)
On arrival initial presentation patient was tachycardic and tachypneic, presenting with mild to moderate respiratory distress with coughing and increased respiratory rate.

## 2022-08-06 NOTE — Progress Notes (Signed)
NIF completed best of 3 is -40

## 2022-08-06 NOTE — ED Triage Notes (Signed)
Patient comes in from home via [facility] with complaints of foreign object in throat. According to EMS, they were called out for choking, when they arrived it was reported that there was an attempt to suction patient with no success. Pt states that she ate some eggs and toast this morning, and feels that a lot of the blockage at this point is mostly mucous. Pt has a history of severe muscular dystrophy, and is currently on hospice. Pt is alert and oriented x4, with no immediate signs of distress at this time.  EMS Vitals: 128/81 99% RA 120 Hr

## 2022-08-06 NOTE — Sepsis Progress Note (Signed)
[facility] will follow per sepsis protocol  

## 2022-08-06 NOTE — Assessment & Plan Note (Signed)
Will continue patient on levothyroxine at 100 mcg daily.

## 2022-08-06 NOTE — ED Provider Notes (Signed)
[facility] Provider Note    Event Date/Time   First MD Initiated Contact with Patient 08/06/22 1306     (approximate)   History   Aspiration   HPI  [name] is a 62 y.o. female past medical history significant for muscular dystrophy who presents to the emergency department for shortness of breath.  Patient states that she was eating and had worsening shortness of breath and felt like she was aspirating on her water.  States that she ate food just prior to that she is uncertain if she aspirated it.  Denies any globus sensation.  Denies any fever or chills.  Does endorse shortness of breath with cough over the past 2 days.  No fever or chills.  Currently on palliative care but states that she is not certain that she wants to be on palliative care any longer.  She would want admission and antibiotics.  She is DNR/DNI.  Endorses generalized weakness and fatigue.  No history of DVT or PE.     Physical Exam   Triage Vital Signs: ED Triage Vitals  Enc Vitals Group     BP --      Pulse Rate 08/06/22 1314 (!) 111     Resp --      Temp 08/06/22 1314 98.1 F (36.7 C)     Temp src --      SpO2 08/06/22 1314 96 %     Weight 08/06/22 1312 158 lb 1.1 oz (71.7 kg)     Height 08/06/22 1312 5\' 5"  (1.651 m)     Head Circumference --      Peak Flow --      Pain Score 08/06/22 1312 0     Pain Loc --      Pain Edu? --      Excl. in GC? --     Most recent vital signs: Vitals:   08/06/22 1314  Pulse: (!) 111  Temp: 98.1 F (36.7 C)  SpO2: 96%    Physical Exam Constitutional:      Appearance: She is well-developed.  HENT:     Head: Atraumatic.  Eyes:     Conjunctiva/sclera: Conjunctivae normal.  Cardiovascular:     Rate and Rhythm: Regular rhythm.  Pulmonary:     Effort: Respiratory distress present.     Breath sounds: Rhonchi present. No rales.     Comments: Increased work of breathing with subcostal retractions.  Speaking in 1-2 word  sentences. NIF - -20, -40, -30  Abdominal:     General: There is no distension.  Musculoskeletal:        General: Normal range of motion.     Cervical back: Normal range of motion.     Right lower leg: Edema present.     Left lower leg: Edema present.  Skin:    General: Skin is warm.  Neurological:     Mental Status: She is alert. Mental status is at baseline.     IMPRESSION / MDM / ASSESSMENT AND PLAN / ED COURSE  I reviewed the triage vital signs and the nursing notes.  Differential diagnosis including pneumonia, pulmonary embolism, ACS, heart failure, aspiration pneumonitis, progression of muscular dystrophy  Given her tachycardia and concern for possible infectious source added on blood cultures and a lactic acid.    Sinus tachycardia while on cardiac telemetry.  RADIOLOGY I independently reviewed imaging, my interpretation of imaging: No obvious signs of pneumonia  LABS (all labs ordered are listed,  but only abnormal results are displayed) Labs interpreted as -    Labs Reviewed  CULTURE, BLOOD (ROUTINE X 2)  CULTURE, BLOOD (ROUTINE X 2)  CBC WITH DIFFERENTIAL/PLATELET  LACTIC ACID, PLASMA  LACTIC ACID, PLASMA  COMPREHENSIVE METABOLIC PANEL  URINALYSIS, W/ REFLEX TO CULTURE (INFECTION SUSPECTED)  BRAIN NATRIURETIC PEPTIDE  D-DIMER, QUANTITATIVE  TROPONIN I (HIGH SENSITIVITY)  TROPONIN I (HIGH SENSITIVITY)     MDM    Significant delay in lab work.  Patient initially refusing lab because she was concerned about paying for it.  After further discussion patient willing to get lab work done.  Delay in antibiotics and blood cultures secondary to patient refusing an IV or lab work.  Felt that 30 cc/kg could be detrimental to the patient given that she has pitting edema, will give a 250 bolus and reevaluate.  Started on antibiotics to cover for community-acquired pneumonia.  NIF performed by respiratory therapy -20, -40, -30  Care transferred to incoming physician  with labs pending.   PROCEDURES:  Critical Care performed: No  Procedures  Patient's presentation is most consistent with acute presentation with potential threat to life or bodily function.   MEDICATIONS ORDERED IN ED: Medications  cefTRIAXone (ROCEPHIN) 2 g in sodium chloride 0.9 % 100 mL IVPB (has no administration in time range)  azithromycin (ZITHROMAX) 500 mg in sodium chloride 0.9 % 250 mL IVPB (has no administration in time range)  sodium chloride 0.9 % bolus 250 mL (250 mLs Intravenous New Bag/Given 08/06/22 1535)    FINAL CLINICAL IMPRESSION(S) / ED DIAGNOSES   Final diagnoses:  Respiratory distress  Aspiration into airway, initial encounter     Rx / DC Orders   ED Discharge Orders     None        Note:  This document was prepared using Dragon voice recognition software and may include unintentional dictation errors.   Corena Herter, MD 08/06/22 [id number]

## 2022-08-06 NOTE — Progress Notes (Signed)
CODE SEPSIS - PHARMACY COMMUNICATION  **Broad Spectrum Antibiotics should be administered within 1 hour of Sepsis diagnosis**  Time Code Sepsis Called/Page Received: 1525  Antibiotics Ordered: ceftriaxone and azithromycin  Time of 1st antibiotic administration: 1545  Additional action taken by pharmacy: none  If necessary, Name of Provider/Nurse Contacted: n/a    Elliot Gurney, PharmD, BCPS Clinical Pharmacist  08/06/2022 3:18 PM

## 2022-08-06 NOTE — ED Notes (Signed)
ED TO INPATIENT HANDOFF REPORT  ED Nurse Name and Phone #: Verlie Hellenbrand  S Name/Age/Gender [name] 62 y.o. female Room/Bed: ED04A/ED04A  Code Status   Code Status: DNR  Home/SNF/Other Home Patient oriented to: self, place, time, and situation Is this baseline? Yes   Triage Complete: Triage complete  Chief Complaint Choking [T17.308A]  Triage Note Patient comes in from home via [facility] with complaints of foreign object in throat. According to EMS, they were called out for choking, when they arrived it was reported that there was an attempt to suction patient with no success. Pt states that she ate some eggs and toast this morning, and feels that a lot of the blockage at this point is mostly mucous. Pt has a history of severe muscular dystrophy, and is currently on hospice. Pt is alert and oriented x4, with no immediate signs of distress at this time.  EMS Vitals: 128/81 99% RA 120 Hr   Allergies No Active Allergies  Level of Care/Admitting Diagnosis ED Disposition     ED Disposition  Admit   Condition  --   Comment  Hospital [facility] [[id number]]  Level of Care: Med-Surg [16]  Covid Evaluation: Asymptomatic - no recent exposure (last 10 days) testing not required  Diagnosis: Choking [[id number]]  Admitting Physician: Darrold Junker  Attending Physician: Darrold Junker          B Medical/Surgery History Past Medical History:  Diagnosis Date   Depression    FSHD (facioscapulohumeral muscular dystrophy) (HCC)    Hashimoto's disease    Heart murmur    Hyperthyroidism    Muscular dystrophy (HCC)    Thyroid disease    Urinary incontinence    Vitamin D deficiency    Past Surgical History:  Procedure Laterality Date   CESAREAN SECTION     TOTAL KNEE ARTHROPLASTY Right    TUBAL LIGATION  1996   Bilateral     A IV Location/Drains/Wounds Patient Lines/Drains/Airways Status     Active Line/Drains/Airways      Name Placement date Placement time Site Days   Peripheral IV 08/06/22 22 G Left;Posterior Hand 08/06/22  1516  Hand  less than 1   Peripheral IV 08/06/22 20 G Left Antecubital 08/06/22  2042  Antecubital  less than 1   External Urinary Catheter 08/06/22  --  --  less than 1            Intake/Output Last 24 hours  Intake/Output Summary (Last 24 hours) at 08/06/2022 2233 Last data filed at 08/06/2022 1922 Gross per 24 hour  Intake 250 ml  Output --  Net 250 ml    Labs/Imaging Results for orders placed or performed during the hospital encounter of 08/06/22 (from the past 48 hour(s))  Lactic acid, plasma     Status: None   Collection Time: 08/06/22  3:18 PM  Result Value Ref Range   Lactic Acid, Venous 1.1 0.5 - 1.9 mmol/L    Comment: Performed at [facility], [address] [place], [place] [address]  Comprehensive metabolic panel     Status: Abnormal   Collection Time: 08/06/22  3:18 PM  Result Value Ref Range   Sodium 136 135 - 145 mmol/L   Potassium 4.1 3.5 - 5.1 mmol/L   Chloride 95 (L) 98 - 111 mmol/L   CO2 29 22 - 32 mmol/L   Glucose, Bld 85 70 - 99 mg/dL    Comment: Glucose reference range applies  only to samples taken after fasting for at least 8 hours.   BUN 12 6 - 20 mg/dL   Creatinine, Ser <4.09 (L) 0.44 - 1.00 mg/dL   Calcium 9.5 8.9 - 81.1 mg/dL   Total Protein 7.9 6.5 - 8.1 g/dL   Albumin 4.8 3.5 - 5.0 g/dL   AST 31 15 - 41 U/L   ALT 24 0 - 44 U/L   Alkaline Phosphatase 58 38 - 126 U/L   Total Bilirubin 1.4 (H) 0.3 - 1.2 mg/dL   GFR, Estimated NOT CALCULATED >60 mL/min    Comment: (NOTE) Calculated using the CKD-EPI Creatinine Equation (2021)    Anion gap 12 5 - 15    Comment: Performed at [facility], [address] [place], [place] [address]  CBC with Differential     Status: None   Collection Time: 08/06/22  3:18 PM  Result Value Ref Range   WBC 8.8 4.0 - 10.5 K/uL   RBC 4.38 3.87 - 5.11 MIL/uL   Hemoglobin 13.8 12.0 - 15.0  g/dL   HCT 29.5 62.1 - 30.8 %   MCV 97.3 80.0 - 100.0 fL   MCH 31.5 26.0 - 34.0 pg   MCHC 32.4 30.0 - 36.0 g/dL   RDW 65.7 84.6 - 96.2 %   Platelets 288 150 - 400 K/uL   nRBC 0.0 0.0 - 0.2 %   Neutrophils Relative % 75 %   Neutro Abs 6.5 1.7 - 7.7 K/uL   Lymphocytes Relative 17 %   Lymphs Abs 1.5 0.7 - 4.0 K/uL   Monocytes Relative 8 %   Monocytes Absolute 0.7 0.1 - 1.0 K/uL   Eosinophils Relative 0 %   Eosinophils Absolute 0.0 0.0 - 0.5 K/uL   Basophils Relative 0 %   Basophils Absolute 0.0 0.0 - 0.1 K/uL   Immature Granulocytes 0 %   Abs Immature Granulocytes 0.03 0.00 - 0.07 K/uL    Comment: Performed at [facility], [address] [place], [place] [address]  Troponin I (High Sensitivity)     Status: None   Collection Time: 08/06/22  3:18 PM  Result Value Ref Range   Troponin I (High Sensitivity) 8 <18 ng/L    Comment: (NOTE) Elevated high sensitivity troponin I (hsTnI) values and significant  changes across serial measurements may suggest ACS but many other  chronic and acute conditions are known to elevate hsTnI results.  Refer to the "Links" section for chest pain algorithms and additional  guidance. Performed at [facility], [address] [place], [place] [address]   Brain natriuretic peptide     Status: None   Collection Time: 08/06/22  3:18 PM  Result Value Ref Range   B Natriuretic Peptide 36.8 0.0 - 100.0 pg/mL    Comment: Performed at [facility], [address] [place], [place] [address]  D-dimer, quantitative     Status: None   Collection Time: 08/06/22  3:18 PM  Result Value Ref Range   D-Dimer, Quant 0.33 0.00 - 0.50 ug/mL-FEU    Comment: (NOTE) At the manufacturer cut-off value of 0.5 g/mL FEU, this assay has a negative predictive value of 95-100%.This assay is intended for use in conjunction with a clinical pretest probability (PTP) assessment model to exclude pulmonary embolism (PE) and deep venous  thrombosis (DVT) in outpatients suspected of PE or DVT. Results should be correlated with clinical presentation. Performed at [facility], [address], [place], [place] [address]   Urinalysis,  w/ Reflex to Culture (Infection Suspected) -Urine, Clean Catch     Status: Abnormal   Collection Time: 08/06/22  7:23 PM  Result Value Ref Range   Specimen Source URINE, RANDOM     Comment: CORRECTED ON 06/19 AT 1948: PREVIOUSLY REPORTED AS URINE, CLEAN CATCH   Color, Urine STRAW (A) YELLOW   APPearance CLEAR (A) CLEAR   Specific Gravity, Urine 1.008 1.005 - 1.030   pH 6.0 5.0 - 8.0   Glucose, UA NEGATIVE NEGATIVE mg/dL   Hgb urine dipstick NEGATIVE NEGATIVE   Bilirubin Urine NEGATIVE NEGATIVE   Ketones, ur 20 (A) NEGATIVE mg/dL   Protein, ur NEGATIVE NEGATIVE mg/dL   Nitrite NEGATIVE NEGATIVE   Leukocytes,Ua NEGATIVE NEGATIVE   RBC / HPF 0-5 0 - 5 RBC/hpf   WBC, UA 0-5 0 - 5 WBC/hpf    Comment:        Reflex urine culture not performed if WBC <=10, OR if Squamous epithelial cells >5. If Squamous epithelial cells >5 suggest recollection.    Bacteria, UA RARE (A) NONE SEEN   Squamous Epithelial / HPF 0-5 0 - 5 /HPF    Comment: Performed at [facility], [address] [place], [place] [address]  Blood gas, venous     Status: Abnormal (Preliminary result)   Collection Time: 08/06/22  8:39 PM  Result Value Ref Range   pH, Ven 7.35 7.25 - 7.43   pCO2, Ven 58 44 - 60 mmHg   pO2, Ven 38 32 - 45 mmHg   Bicarbonate 32.0 (H) 20.0 - 28.0 mmol/L   Acid-Base Excess 4.7 (H) 0.0 - 2.0 mmol/L   O2 Saturation 59.7 %   Patient temperature 37.0     Comment: Performed at [facility], [address] [place], [place] [address]   Collection site PENDING    Drawn by PENDING    CT Angio Chest PE W/Cm &/Or Wo Cm  Result Date: 08/06/2022 CLINICAL DATA:  Shortness of breath EXAM: CT ANGIOGRAPHY CHEST WITH CONTRAST TECHNIQUE: Multidetector CT imaging of the chest was  performed using the standard protocol during bolus administration of intravenous contrast. Multiplanar CT image reconstructions and MIPs were obtained to evaluate the vascular anatomy. RADIATION DOSE REDUCTION: This exam was performed according to the departmental dose-optimization program which includes automated exposure control, adjustment of the mA and/or kV according to patient size and/or use of iterative reconstruction technique. CONTRAST:  75mL OMNIPAQUE IOHEXOL 350 MG/ML SOLN COMPARISON:  Chest x-ray 08/06/2022 FINDINGS: Cardiovascular: Satisfactory opacification of the pulmonary arteries to the segmental level. No evidence of pulmonary embolism. Mild aortic atherosclerosis. Aneurysmal dilatation of the ascending aorta measuring up to 4.3 cm. No dissection. Coronary vascular calcification. Normal cardiac size. No pericardial effusion Mediastinum/Nodes: No enlarged mediastinal, hilar, or axillary lymph nodes. Thyroid gland, trachea, and esophagus demonstrate no significant findings. Lungs/Pleura: Dependent atelectasis in the lower lobes. No consolidation, pleural effusion or pneumothorax Upper Abdomen: No acute finding Musculoskeletal: No acute osseous abnormality. Fatty atrophy of the paraspinal muscles. Review of the MIP images confirms the above findings. IMPRESSION: 1. Negative for acute pulmonary embolus. 2. Aortic atherosclerosis. Aneurysmal dilatation of ascending aorta up to 4.3 cm. Recommend annual imaging followup by CTA or MRA. This recommendation follows 2010 ACCF/AHA/AATS/ACR/ASA/SCA/SCAI/SIR/STS/SVM Guidelines for the Diagnosis and Management of Patients with Thoracic Aortic Disease. Circulation. 2010; 121: U981-X914. Aortic aneurysm NOS (ICD10-I71.9) Aortic Atherosclerosis (ICD10-I70.0). Aortic aneurysm NOS (ICD10-I71.9). Electronically Signed   By: Jasmine Pang M.D.   On: 08/06/2022 21:23  US Venous Img Lower Bilateral (DVT)  Result Date: 08/06/2022 CLINICAL DATA:  Bilateral lower  extremity edema. EXAM: BILATERAL LOWER EXTREMITY VENOUS DOPPLER ULTRASOUND TECHNIQUE: Gray-scale sonography with graded compression, as well as color Doppler and duplex ultrasound were performed to evaluate the lower extremity deep venous systems from the level of the common femoral vein and including the common femoral, femoral, profunda femoral, popliteal and calf veins including the posterior tibial, peroneal and gastrocnemius veins when visible. The superficial great saphenous vein was also interrogated. Spectral Doppler was utilized to evaluate flow at rest and with distal augmentation maneuvers in the common femoral, femoral and popliteal veins. COMPARISON:  None Available. FINDINGS: RIGHT LOWER EXTREMITY Common Femoral Vein: No evidence of thrombus. Normal compressibility, respiratory phasicity and response to augmentation. Saphenofemoral Junction: No evidence of thrombus. Normal compressibility and flow on color Doppler imaging. Profunda Femoral Vein: No evidence of thrombus. Normal compressibility and flow on color Doppler imaging. Femoral Vein: No evidence of thrombus. Normal compressibility, respiratory phasicity and response to augmentation. Popliteal Vein: No evidence of thrombus. Normal compressibility, respiratory phasicity and response to augmentation. Calf Veins: No evidence of thrombus. Normal compressibility and flow on color Doppler imaging. Superficial Great Saphenous Vein: No evidence of thrombus. Normal compressibility. Venous Reflux:  None. Other Findings:  None. LEFT LOWER EXTREMITY Common Femoral Vein: No evidence of thrombus. Normal compressibility, respiratory phasicity and response to augmentation. Saphenofemoral Junction: No evidence of thrombus. Normal compressibility and flow on color Doppler imaging. Profunda Femoral Vein: No evidence of thrombus. Normal compressibility and flow on color Doppler imaging. Femoral Vein: No evidence of thrombus. Normal compressibility, respiratory  phasicity and response to augmentation. Popliteal Vein: No evidence of thrombus. Normal compressibility, respiratory phasicity and response to augmentation. Calf Veins: No evidence of thrombus. Normal compressibility and flow on color Doppler imaging. Superficial Great Saphenous Vein: No evidence of thrombus. Normal compressibility. Venous Reflux:  None. Other Findings:  None. IMPRESSION: No evidence of deep venous thrombosis in either lower extremity. Electronically Signed   By: Aram Candela M.D.   On: 08/06/2022 20:40   DG Chest Port 1 View  Result Date: 08/06/2022 CLINICAL DATA:  Sepsis EXAM: PORTABLE CHEST 1 VIEW COMPARISON:  X-ray 07/05/2022 FINDINGS: No consolidation, pneumothorax or effusion. No edema. Normal cardiopericardial silhouette. Calcified aorta. Overlapping cardiac leads. Degenerative changes along the spine. IMPRESSION: No acute cardiopulmonary disease. Electronically Signed   By: Karen Kays M.D.   On: 08/06/2022 14:58    Pending Labs Unresulted Labs (From admission, onward)     Start     Ordered   08/07/22 0500  Comprehensive metabolic panel  Tomorrow morning,   R        08/06/22 2219   08/07/22 0500  CBC  Tomorrow morning,   R        08/06/22 2219   08/06/22 2217  HIV Antibody (routine testing w rflx)  (HIV Antibody (Routine testing w reflex) panel)  Once,   R        08/06/22 2219   08/06/22 1334  Lactic acid, plasma  (Undifferentiated presentation (screening labs and basic nursing orders))  Now then every 2 hours,   STAT      08/06/22 1333   08/06/22 1334  Blood Culture (routine x 2)  (Undifferentiated presentation (screening labs and basic nursing orders))  BLOOD CULTURE X 2,   STAT      08/06/22 1333            Vitals/Pain Today's Vitals   08/06/22 2030  08/06/22 2130 08/06/22 2204 08/06/22 2230  BP: (!) 123/99 114/86  106/84  Pulse: (!) 103 100  100  Resp: (!) 27 (!) 23  (!) 24  Temp:   (!) 97.4 F (36.3 C)   TempSrc:   Oral   SpO2: 99% 100%  100%   Weight:      Height:      PainSc:        Isolation Precautions No active isolations  Medications Medications  cefTRIAXone (ROCEPHIN) 2 g in sodium chloride 0.9 % 100 mL IVPB (0 g Intravenous Stopped 08/06/22 1748)  azithromycin (ZITHROMAX) 500 mg in sodium chloride 0.9 % 250 mL IVPB (0 mg Intravenous Stopped 08/06/22 1802)  levothyroxine (SYNTHROID) tablet 112 mcg (has no administration in time range)  heparin injection 5,000 Units (has no administration in time range)  sodium chloride flush (NS) 0.9 % injection 3 mL (has no administration in time range)  acetaminophen (TYLENOL) tablet 650 mg (has no administration in time range)    Or  acetaminophen (TYLENOL) suppository 650 mg (has no administration in time range)  morphine (PF) 2 MG/ML injection 2 mg (has no administration in time range)  0.9 %  sodium chloride infusion (has no administration in time range)  pantoprazole (PROTONIX) injection 40 mg (has no administration in time range)  sodium chloride 0.9 % bolus 250 mL (0 mLs Intravenous Stopped 08/06/22 1922)  venlafaxine XR (EFFEXOR-XR) 24 hr capsule 75 mg (75 mg Oral Given 08/06/22 1800)  iohexol (OMNIPAQUE) 350 MG/ML injection 65 mL (65 mLs Intravenous Contrast Given 08/06/22 1751)  morphine (PF) 2 MG/ML injection 2 mg (2 mg Intravenous Given 08/06/22 2037)  iohexol (OMNIPAQUE) 350 MG/ML injection 75 mL (75 mLs Intravenous Contrast Given 08/06/22 2058)    Mobility non-ambulatory     Focused Assessments Pulmonary Assessment Handoff:  Lung sounds:   O2 Device: Nasal Cannula O2 Flow Rate (L/min): 2 L/min    R Recommendations: See Admitting Provider Note  Report given to:   Additional Notes:

## 2022-08-07 ENCOUNTER — Encounter: Payer: Self-pay | Admitting: Internal Medicine

## 2022-08-07 DIAGNOSIS — R0603 Acute respiratory distress: Secondary | ICD-10-CM | POA: Diagnosis not present

## 2022-08-07 DIAGNOSIS — T17908A Unspecified foreign body in respiratory tract, part unspecified causing other injury, initial encounter: Secondary | ICD-10-CM | POA: Diagnosis not present

## 2022-08-07 LAB — COMPREHENSIVE METABOLIC PANEL
ALT: 19 U/L (ref 0–44)
AST: 19 U/L (ref 15–41)
Albumin: 4 g/dL (ref 3.5–5.0)
Alkaline Phosphatase: 52 U/L (ref 38–126)
Anion gap: 10 (ref 5–15)
BUN: 8 mg/dL (ref 6–20)
CO2: 28 mmol/L (ref 22–32)
Calcium: 8.9 mg/dL (ref 8.9–10.3)
Chloride: 100 mmol/L (ref 98–111)
Creatinine, Ser: <0.3 mg/dL — ABNORMAL LOW (ref 0.44–1.00)
Glucose, Bld: 77 mg/dL (ref 70–99)
Potassium: 3.5 mmol/L (ref 3.5–5.1)
Sodium: 138 mmol/L (ref 135–145)
Total Bilirubin: 1.1 mg/dL (ref 0.3–1.2)
Total Protein: 6.7 g/dL (ref 6.5–8.1)

## 2022-08-07 LAB — HIV ANTIBODY (ROUTINE TESTING W REFLEX): HIV Screen 4th Generation wRfx: NONREACTIVE

## 2022-08-07 LAB — CBC
HCT: 39.6 % (ref 36.0–46.0)
Hemoglobin: 12.4 g/dL (ref 12.0–15.0)
MCH: 31.2 pg (ref 26.0–34.0)
MCHC: 31.3 g/dL (ref 30.0–36.0)
MCV: 99.5 fL (ref 80.0–100.0)
Platelets: 245 10*3/uL (ref 150–400)
RBC: 3.98 MIL/uL (ref 3.87–5.11)
RDW: 12.2 % (ref 11.5–15.5)
WBC: 9.9 10*3/uL (ref 4.0–10.5)
nRBC: 0 % (ref 0.0–0.2)

## 2022-08-07 LAB — TSH: TSH: 5.783 u[IU]/mL — ABNORMAL HIGH (ref 0.350–4.500)

## 2022-08-07 MED ORDER — AZITHROMYCIN 250 MG PO TABS
500.0000 mg | ORAL_TABLET | Freq: Every day | ORAL | Status: AC
  Administered 2022-08-08 – 2022-08-11 (×4): 500 mg via ORAL
  Filled 2022-08-07 (×4): qty 2

## 2022-08-07 MED ORDER — VENLAFAXINE HCL ER 75 MG PO CP24
75.0000 mg | ORAL_CAPSULE | Freq: Every day | ORAL | Status: DC
  Administered 2022-08-07 – 2022-08-11 (×5): 75 mg via ORAL
  Filled 2022-08-07 (×5): qty 1

## 2022-08-07 MED ORDER — LACTATED RINGERS IV SOLN: INTRAVENOUS | Status: DC

## 2022-08-07 MED ORDER — SORBITOL 70 % SOLN
960.0000 mL | TOPICAL_OIL | Freq: Once | ORAL | Status: AC
  Administered 2022-08-07: 960 mL via RECTAL
  Filled 2022-08-07: qty 240

## 2022-08-07 NOTE — Evaluation (Signed)
Clinical/Bedside Swallow Evaluation Patient Details  Name: [name] MRN: [id number] Date of Birth: Feb 04, 1961  Today's Date: 08/07/2022 Time: SLP Start Time (ACUTE ONLY): 1450 SLP Stop Time (ACUTE ONLY): 1545 SLP Time Calculation (min) (ACUTE ONLY): 55 min  Past Medical History:  Past Medical History:  Diagnosis Date   Depression    FSHD (facioscapulohumeral muscular dystrophy) (HCC)    Hashimoto's disease    Heart murmur    Hyperthyroidism    Muscular dystrophy (HCC)    Thyroid disease    Urinary incontinence    Vitamin D deficiency    Past Surgical History:  Past Surgical History:  Procedure Laterality Date   CESAREAN SECTION     TOTAL KNEE ARTHROPLASTY Right    TUBAL LIGATION  1996   Bilateral   HPI:  Pt is a 62 y.o. female with medical history significant for muscular dystrophy, FSHD, hypothyroidism, depression who arrived at the ED post choking episode where she was not able to swallow her own secretions - pt and Friend stated pt choked on "her own secretions while crying and could not cough hard encough to clear it".  She stated there was a "mucousy" blockage; she denied any difficulty swallowing the foods/liquids at her breakfast meal.  Per chart notes in the ED, "patient states that she lives at home with her dad who is 54 years old and he is not able to take care of her and that her husband that she was married to for 30 years left. Patient is currently on Hospice and is not sure that she wants to be on palliative care anymore."  Patient requested to be admitted for antibiotics and treatment in the ED.   CT of Chest this admit: Dependent atelectasis in the lower lobes. No  consolidation, pleural effusion or pneumothorax; previous CXR in 07/05/22 was "No active disease".    Assessment / Plan / Recommendation  Clinical Impression   Pt seen for BSE today. Pt awake, verbal and engaged in conversation but quickly fatigued during the session w/ the exertion of talking  and being awake and uncomfortable in the bed -- "my butt hurts and I cannot get comfortable". Friend present in room. Very mild dysarthria present -- Friend stated this is Baseline "when she gets tired". Noted limited use of bilat. UEs -- pt has Muscular Dystrophy.  NT to assist pt in turning in bed post NSG replacing IV(after BSE). Pt on RA, afebrile. WBC WNL.  Pt appears to present w/ functional oropharyngeal phase swallowing w/ no overt oropharyngeal phase dysphagia noted, no overt sensorimotor deficits noted. Pt consumed po trials w/ No immediate, overt clinical s/s of aspiration during po trials.  Pt appears at reduced risk for aspiration following general aspiration precautions and strategies for conservation of energy(ie, moistened, cut foods; rest breaks during oral intake; support w/ feeding).  However, pt does have challenging factors that could impact her oropharyngeal swallowing to include discomfort(NSG aware) positioning/lying in bed, fatigue/weakness d/t Muscular Dystrophy and this hospitalization, and dependency on being fed d/t declined self-feeding abilities. These factors can increase risk for aspiration, dysphagia as well as decreased oral intake overall.   During po trials, pt needed positioning and head support w/ a towel roll -- pt tends to request a slightly reclined positioning for upper body (in bed or her scooter) for better breathing comfort d/t the Muscular Dystrophy. Pt consumed all consistencies w/ no overt coughing, decline in vocal quality, or change in respiratory presentation during/post trials. Laryngeal excursion WFL.  O2 sats remained upper90s during/post oral intake. Oral phase appeared [facility] w/ timely bolus management, mastication, and control of bolus propulsion for A-P transfer for swallowing. Possibly slight increased time for mastication of the solid trials -- moistened, softened foods given. Oral clearing achieved w/ all trial consistencies. Rest Breaks given  intermittently during the po trials for conservation of energy (no increased WOB noted during po intake). OM Exam appeared [facility] w/ no unilateral weakness noted. Speech fully intelligible -- very mild dysarthria w/ fatigue of Talking w/ Friend and mid-day(baseline for pt per Friend). Pt was able to help hold cup w/ this SLP(which increases safety of swallowing) but was unable to feed herself d/t weak Bilat. UEs.   Recommend a more Mech Soft consistency diet w/ well-Cut meats, moistened foods; less difficult to chew foods for conservation of energy. Thin liquids -- carefully monitor straw use, and pt should help to Hold Cup when drinking. Recommend general aspiration precautions, strategies to conserve energy during meals: adjusting food consistencies for easy to chew foods, moistening foods, Rest Breaks, feeding support, drink cup w/ handles for easier holding w/ feeder. Pills WHOLE in Puree for safer, easier swallowing.   Education given on Pills Whole in Puree for safety(NSG to teach); food consistencies, prep, and easy to eat options; general aspiration precautions to pt and Friend, Clydie Braun present. Pt agreed. No further skilled ST services indicated. MD/NSG updated, agreed and will reconsult if any new needs arise. NSG present in room at end of session. Handouts provided. Recommend Dietician f/u for nutritional support. Recommend Neurology f/u for education on disease progression, management. Palliative Care f/u for support. SLP Visit Diagnosis: Dysphagia, unspecified (R13.10) (functional but impacted by fatigue)    Aspiration Risk  Mild aspiration risk;Risk for inadequate nutrition/hydration (reduced when following general aspiration precautions, strategy of conservation of energy)    Diet Recommendation   a more Mech Soft consistency diet w/ well-Cut meats, moistened foods; less difficult to chew foods for conservation of energy. Thin liquids -- carefully monitor straw use, and pt should help to Hold  Cup when drinking. Recommend general aspiration precautions, strategies to conserve energy during meals: adjusting food consistencies for easy to chew foods, moistening foods, Rest Breaks, feeding support, drink cup w/ handles to help hold w/ feeder.   Medication Administration: Whole meds with puree (for safer swallowing)    Other  Recommendations Recommended Consults:  (Dietician f/u; Neurology f/u at D/C for education; Palliative Care) Oral Care Recommendations: Oral care BID;Oral care before and after PO;Staff/trained caregiver to provide oral care    Recommendations for follow up therapy are one component of a multi-disciplinary discharge planning process, led by the attending physician.  Recommendations may be updated based on patient status, additional functional criteria and insurance authorization.  Follow up Recommendations No SLP follow up      Assistance Recommended at Discharge  full  Functional Status Assessment Patient has not had a recent decline in their functional status (pt stated she is at her baseline)  Frequency and Duration  (n/a)   (n/a)       Prognosis Prognosis for improved oropharyngeal function: Fair (-Good) Barriers to Reach Goals: Time post onset;Severity of deficits Barriers/Prognosis Comment: impact from Muscular Dystrophy and fatigue factor      Swallow Study   General Date of Onset: 08/06/22 HPI: Pt is a 62 y.o. female with medical history significant for muscular dystrophy, FSHD, hypothyroidism, depression who arrived at the ED post choking episode where she was not able to  swallow her own secretions - pt and Friend stated pt choked on "her own secretions while crying and could not cough hard encough to clear it".  She stated there was a "mucousy" blockage; she denied any difficulty swallowing the foods/liquids at her breakfast meal.  Per chart notes in the ED, "patient states that she lives at home with her dad who is 3 years old and he is not able to  take care of her and that her husband that she was married to for 30 years left. Patient is currently on Hospice and is not sure that she wants to be on palliative care anymore."  Patient requested to be admitted for antibiotics and treatment in the ED.   CT of Chest this admit: Dependent atelectasis in the lower lobes. No  consolidation, pleural effusion or pneumothorax; previous CXR in 07/05/22 was "No active disease". Type of Study: Bedside Swallow Evaluation Previous Swallow Assessment: none Diet Prior to this Study: Thin liquids (Level 0) (clears per MD) Temperature Spikes Noted: No (wbc 9.9) Respiratory Status: Room air History of Recent Intubation: No Behavior/Cognition: Alert;Cooperative;Pleasant mood Oral Cavity Assessment: Within Functional Limits Oral Care Completed by SLP: Recent completion by staff Oral Cavity - Dentition: Adequate natural dentition Vision: Functional for self-feeding Self-Feeding Abilities: Needs assist;Needs set up;Total assist (weak Bilat UEs) Patient Positioning: Upright in bed (needed positioning and head support w/ towel roll -- pt tends to have a slightly reclined positioning for better breathing comfort d/t the MD.) Baseline Vocal Quality: Normal Volitional Cough: Weak Volitional Swallow: Able to elicit    Oral/Motor/Sensory Function Overall Oral Motor/Sensory Function: Within functional limits (functional)   Ice Chips Ice chips: Within functional limits Presentation: Spoon (fed; 1 trial)   Thin Liquid Thin Liquid: Within functional limits Presentation: Straw;Self Fed (fully supported: 15+ trials w/ some multiple sips)    Nectar Thick Nectar Thick Liquid: Not tested   Honey Thick Honey Thick Liquid: Not tested   Puree Puree: Within functional limits Presentation: Spoon (fed; 10+ trials)   Solid     Solid: Within functional limits (grossly) Presentation: Spoon (fed; 6 trials) Other Comments: moistened well        Jerilynn Som, MS,  CCC-SLP Speech Language Pathologist Rehab Services; [facility] [place] [phone] (ascom) Balian Schaller 08/07/2022,5:19 PM

## 2022-08-07 NOTE — Progress Notes (Signed)
PROGRESS NOTE  [name]    DOB: 1960/07/17, 62 y.o.  ZOX:[id number]    Code Status: DNR   DOA: 08/06/2022   LOS: 0   Brief hospital course  [name] is a 62 y.o. female with a PMH significant for muscular dystrophy, hypothyroidism, depression, hypothyroidism, HLD, cardiac murmur.  They presented from home to the ED on 08/06/2022 with difficulty swallowing x several days. She was choking on food/drink and also had difficulty with swallowing her secretions. Denies prior difficulty with this.   In the ED, it was found that they had HR 111, temp 98.1, RR 30, BP 116/89, 96% O2 on 2L [place].  Significant findings included completely normal CBC, and unremarkable metabolic panel. Negative troponin, D-dimer and LA. Urinalysis positive for ketones.   They were initially treated with home medications, azithromycin and CTX.   Patient was admitted to medicine service for further workup and management of dysphagia as outlined in detail below.  08/07/22 -stable  Assessment & Plan  Principal Problem:   Choking Active Problems:   SIRS (systemic inflammatory response syndrome) (HCC)   Bilateral leg edema   Hypothyroidism   MUSCULAR DYSTROPHY  Dysphagia- self-reported aspiration. She states that she's able to swallow thin liquids but not thickened liquids. In relation to worsening muscular dystrophy - SLP evaluation - IVF maintenance  Acute hypoxic respiratory failure- initially on 2L[place] and has now weaned to room air. Denies respiratory complaints. Possibly related to aspiration.  - monitor breathing  Muscular dystrophy- with progressing disease, patient wants to know options for residential care - PT/OT - SLP - TOC  Constipation-  - enema - continue miralax  Hypothyroidism - TSH was 15.62 on 5/20. May be contributing to worsening wewakness - recheck TSH - continue levothyroxine   Body mass index is 28.54 kg/m.  VTE ppx: heparin injection 5,000 Units Start: 08/06/22  2230   Diet:     Diet   Diet clear liquid Room service appropriate? Yes; Fluid consistency: Thin   Consultants: None   Subjective 08/07/22    Pt reports feeling scared to eat or drink due to the choking she has been experiencing.    Objective   Vitals:   08/06/22 2230 08/06/22 2343 08/07/22 0408 08/07/22 0414  BP: 106/84 110/83 103/80   Pulse: 100 91 (!) 102   Resp: (!) 24 16 16    Temp:  98 F (36.7 C) 97.8 F (36.6 C)   TempSrc:  Oral Oral   SpO2: 100% 100% 100%   Weight:    77.8 kg  Height:        Intake/Output Summary (Last 24 hours) at 08/07/2022 0728 Last data filed at 08/07/2022 0525 Gross per 24 hour  Intake 550 ml  Output --  Net 550 ml   Filed Weights   08/06/22 1312 08/07/22 0414  Weight: 71.7 kg 77.8 kg    Physical Exam:  General: awake, alert, NAD HEENT: atraumatic, clear conjunctiva, anicteric sclera, MMM, hearing grossly normal Respiratory: normal respiratory effort. CTAB Cardiovascular: quick capillary refill, normal S1/S2, RRR, no JVD, murmurs Nervous: A&O x3. no gross focal neurologic deficits, normal speech Extremities: non-pitting edema diffusely, low muscle tone Skin: dry, intact, normal temperature, normal color. No rashes, lesions or ulcers on exposed skin Psychiatry: normal mood, congruent affect  Labs   I have personally reviewed the following labs and imaging studies CBC    Component Value Date/Time   WBC 9.9 08/07/2022 0428   RBC 3.98 08/07/2022 0428   HGB  12.4 08/07/2022 0428   HGB 10.3 (L) 02/19/2014 0421   HCT 39.6 08/07/2022 0428   HCT 31.0 (L) 02/19/2014 0421   PLT 245 08/07/2022 0428   PLT 251 02/19/2014 0421   MCV 99.5 08/07/2022 0428   MCV 94 02/19/2014 0421   MCH 31.2 08/07/2022 0428   MCHC 31.3 08/07/2022 0428   RDW 12.2 08/07/2022 0428   RDW 13.1 02/19/2014 0421   LYMPHSABS 1.5 08/06/2022 1518   LYMPHSABS 1.2 02/19/2014 0421   MONOABS 0.7 08/06/2022 1518   MONOABS 0.8 02/19/2014 0421   EOSABS 0.0 08/06/2022  1518   EOSABS 0.2 02/19/2014 0421   BASOSABS 0.0 08/06/2022 1518   BASOSABS 0.0 02/19/2014 0421      Latest Ref Rng & Units 08/07/2022    4:28 AM 08/06/2022    3:18 PM 07/05/2022   12:34 PM  BMP  Glucose 70 - 99 mg/dL 77  85  811   BUN 6 - 20 mg/dL 8  12  13    Creatinine 0.44 - 1.00 mg/dL <9.14  <7.82  <9.56   Sodium 135 - 145 mmol/L 138  136  137   Potassium 3.5 - 5.1 mmol/L 3.5  4.1  3.9   Chloride 98 - 111 mmol/L 100  95  98   CO2 22 - 32 mmol/L 28  29  31    Calcium 8.9 - 10.3 mg/dL 8.9  9.5  9.3     CT Angio Chest PE W/Cm &/Or Wo Cm  Result Date: 08/06/2022 CLINICAL DATA:  Shortness of breath EXAM: CT ANGIOGRAPHY CHEST WITH CONTRAST TECHNIQUE: Multidetector CT imaging of the chest was performed using the standard protocol during bolus administration of intravenous contrast. Multiplanar CT image reconstructions and MIPs were obtained to evaluate the vascular anatomy. RADIATION DOSE REDUCTION: This exam was performed according to the departmental dose-optimization program which includes automated exposure control, adjustment of the mA and/or kV according to patient size and/or use of iterative reconstruction technique. CONTRAST:  75mL OMNIPAQUE IOHEXOL 350 MG/ML SOLN COMPARISON:  Chest x-ray 08/06/2022 FINDINGS: Cardiovascular: Satisfactory opacification of the pulmonary arteries to the segmental level. No evidence of pulmonary embolism. Mild aortic atherosclerosis. Aneurysmal dilatation of the ascending aorta measuring up to 4.3 cm. No dissection. Coronary vascular calcification. Normal cardiac size. No pericardial effusion Mediastinum/Nodes: No enlarged mediastinal, hilar, or axillary lymph nodes. Thyroid gland, trachea, and esophagus demonstrate no significant findings. Lungs/Pleura: Dependent atelectasis in the lower lobes. No consolidation, pleural effusion or pneumothorax Upper Abdomen: No acute finding Musculoskeletal: No acute osseous abnormality. Fatty atrophy of the paraspinal  muscles. Review of the MIP images confirms the above findings. IMPRESSION: 1. Negative for acute pulmonary embolus. 2. Aortic atherosclerosis. Aneurysmal dilatation of ascending aorta up to 4.3 cm. Recommend annual imaging followup by CTA or MRA. This recommendation follows 2010 ACCF/AHA/AATS/ACR/ASA/SCA/SCAI/SIR/STS/SVM Guidelines for the Diagnosis and Management of Patients with Thoracic Aortic Disease. Circulation. 2010; 121: O130-Q657. Aortic aneurysm NOS (ICD10-I71.9) Aortic Atherosclerosis (ICD10-I70.0). Aortic aneurysm NOS (ICD10-I71.9). Electronically Signed   By: Jasmine Pang M.D.   On: 08/06/2022 21:23   US Venous Img Lower Bilateral (DVT)  Result Date: 08/06/2022 CLINICAL DATA:  Bilateral lower extremity edema. EXAM: BILATERAL LOWER EXTREMITY VENOUS DOPPLER ULTRASOUND TECHNIQUE: Gray-scale sonography with graded compression, as well as color Doppler and duplex ultrasound were performed to evaluate the lower extremity deep venous systems from the level of the common femoral vein and including the common femoral, femoral, profunda femoral, popliteal and calf veins including the posterior tibial, peroneal and gastrocnemius  veins when visible. The superficial great saphenous vein was also interrogated. Spectral Doppler was utilized to evaluate flow at rest and with distal augmentation maneuvers in the common femoral, femoral and popliteal veins. COMPARISON:  None Available. FINDINGS: RIGHT LOWER EXTREMITY Common Femoral Vein: No evidence of thrombus. Normal compressibility, respiratory phasicity and response to augmentation. Saphenofemoral Junction: No evidence of thrombus. Normal compressibility and flow on color Doppler imaging. Profunda Femoral Vein: No evidence of thrombus. Normal compressibility and flow on color Doppler imaging. Femoral Vein: No evidence of thrombus. Normal compressibility, respiratory phasicity and response to augmentation. Popliteal Vein: No evidence of thrombus. Normal  compressibility, respiratory phasicity and response to augmentation. Calf Veins: No evidence of thrombus. Normal compressibility and flow on color Doppler imaging. Superficial Great Saphenous Vein: No evidence of thrombus. Normal compressibility. Venous Reflux:  None. Other Findings:  None. LEFT LOWER EXTREMITY Common Femoral Vein: No evidence of thrombus. Normal compressibility, respiratory phasicity and response to augmentation. Saphenofemoral Junction: No evidence of thrombus. Normal compressibility and flow on color Doppler imaging. Profunda Femoral Vein: No evidence of thrombus. Normal compressibility and flow on color Doppler imaging. Femoral Vein: No evidence of thrombus. Normal compressibility, respiratory phasicity and response to augmentation. Popliteal Vein: No evidence of thrombus. Normal compressibility, respiratory phasicity and response to augmentation. Calf Veins: No evidence of thrombus. Normal compressibility and flow on color Doppler imaging. Superficial Great Saphenous Vein: No evidence of thrombus. Normal compressibility. Venous Reflux:  None. Other Findings:  None. IMPRESSION: No evidence of deep venous thrombosis in either lower extremity. Electronically Signed   By: Aram Candela M.D.   On: 08/06/2022 20:40   DG Chest Port 1 View  Result Date: 08/06/2022 CLINICAL DATA:  Sepsis EXAM: PORTABLE CHEST 1 VIEW COMPARISON:  X-ray 07/05/2022 FINDINGS: No consolidation, pneumothorax or effusion. No edema. Normal cardiopericardial silhouette. Calcified aorta. Overlapping cardiac leads. Degenerative changes along the spine. IMPRESSION: No acute cardiopulmonary disease. Electronically Signed   By: Karen Kays M.D.   On: 08/06/2022 14:58    Disposition Plan & Communication  Patient status: Observation  Admitted From: Home Planned disposition location: Home Anticipated discharge date: 6/22 pending swallow evaluation and dispo planning  Family Communication: son on phone and friend at  bedside    Author: Leeroy Bock, DO [facility] 08/07/2022, 7:28 AM   Available by Epic secure chat 7AM-7PM. If 7PM-7AM, please contact night-coverage.  [facility] contact information found on [email or web address].

## 2022-08-07 NOTE — TOC Progression Note (Signed)
Transition of Care [facility]) - Progression Note    Patient Details  Name: [name] MRN: [id number] Date of Birth: July 29, 1960  Transition of Care [facility]) CM/SW Contact  Kreg Shropshire, RN Phone Number: 08/07/2022, 3:46 PM  Clinical Narrative:     Assessment: Cm discussed with patient d/c planning. She stated she would like to know different options for her when she is d/c. She is concerned with  Support System: she lives with her 82 year old father who is not able to help member with ADLs. Her son lives 3 hours away. She was previously enrolled with [place] [facility] for additional help. However, she was d/c from hospice care when admitted to hospital. Spoke with Sammuel Cooper social worker Theador Hawthorne  and she updated cm regarding patient previous enrollment with hospice.  DME: She has 2 electric scooters, wheelchair, and tub transfer bench at home. Plan: Cm sent secure message to Dr. Jamelle Rushing for PT/OT evaluation.   Expected Discharge Plan: Skilled Nursing Facility Barriers to Discharge: Continued Medical Work up  Expected Discharge Plan and Services                                               Social Determinants of Health (SDOH) Interventions SDOH Screenings   Food Insecurity: No Food Insecurity (08/07/2022)  Housing: Low Risk  (08/07/2022)  Transportation Needs: No Transportation Needs (08/07/2022)  Utilities: Not At Risk (08/07/2022)  Alcohol Screen: Low Risk  (01/17/2017)  Tobacco Use: Low Risk  (08/07/2022)    Readmission Risk Interventions     No data to display

## 2022-08-07 NOTE — Progress Notes (Signed)
   08/07/22 1800  Spiritual Encounters  Type of Visit Initial  Care provided to: Patient  Referral source Patient request  Reason for visit Urgent spiritual support  OnCall Visit Yes  Spiritual Framework  Presenting Themes Meaning/purpose/sources of inspiration;Impactful experiences and emotions  Community/Connection Family  Patient Stress Factors Health changes  Interventions  Spiritual Care Interventions Made Established relationship of care and support;Compassionate presence;Encouragement;Reflective listening;Prayer;Meaning making;Normalization of emotions  Intervention Outcomes  Outcomes Connection to spiritual care;Awareness around self/spiritual resourses;Awareness of health;Reduced anxiety;Autonomy/agency  Spiritual Care Plan  Spiritual Care Issues Still Outstanding No further spiritual care needs at this time (see row info)   Chaplain responded to page for spiritual support. Patient expressed being tired with her health diagnosis and is ready to go home to be with the Lord when the time comes.  Patient declined suicidal ideation or harm to self. Spent time bedside about 30 minutes. Expressed desire for prayer and noted she felt better.  Chaplain services remain available for follow up spiritual and emotional support as needed. Please consult if needs arise.

## 2022-08-07 NOTE — Plan of Care (Signed)

## 2022-08-07 NOTE — Progress Notes (Signed)
   08/07/22 0800  Assess: MEWS Score  Temp (!) 97.5 F (36.4 C)  BP 94/79  MAP (mmHg) 86  Pulse Rate (!) 102  Resp 18  Level of Consciousness Alert  SpO2 95 %  O2 Device Room Air  Assess: MEWS Score  MEWS Temp 0  MEWS Systolic 1  MEWS Pulse 1  MEWS RR 0  MEWS LOC 0  MEWS Score 2  MEWS Score Color Yellow  Assess: if the MEWS score is Yellow or Red  Were vital signs taken at a resting state? Yes  Focused Assessment No change from prior assessment  Does the patient meet 2 or more of the SIRS criteria? No  MEWS guidelines implemented  No, other (Comment)  Notify: Charge Nurse/RN  Name of Charge Nurse/RN Barrister's clerk, RN  Assess: SIRS CRITERIA  SIRS Temperature  0  SIRS Pulse 1  SIRS Respirations  0  SIRS WBC 0  SIRS Score Sum  1

## 2022-08-07 NOTE — Progress Notes (Signed)
PHARMACIST - PHYSICIAN COMMUNICATION  CONCERNING: Antibiotic IV to Oral Route Change Policy  RECOMMENDATION: This patient is receiving azithromycin by the intravenous route.  Based on criteria approved by the Pharmacy and Therapeutics Committee, the antibiotic(s) is/are being converted to the equivalent oral dose form(s).  DESCRIPTION: These criteria include: Patient being treated for a respiratory tract infection, urinary tract infection, cellulitis or clostridium difficile associated diarrhea if on metronidazole The patient is not neutropenic and does not exhibit a GI malabsorption state The patient is eating (either orally or via tube) and/or has been taking other orally administered medications for a least 24 hours The patient is improving clinically and has a Tmax < 100.5  If you have questions about this conversion, please contact the Pharmacy Department   Tressie Ellis 08/07/22

## 2022-08-08 DIAGNOSIS — E785 Hyperlipidemia, unspecified: Secondary | ICD-10-CM | POA: Diagnosis present

## 2022-08-08 DIAGNOSIS — R0603 Acute respiratory distress: Secondary | ICD-10-CM

## 2022-08-08 DIAGNOSIS — T17908A Unspecified foreign body in respiratory tract, part unspecified causing other injury, initial encounter: Secondary | ICD-10-CM | POA: Diagnosis not present

## 2022-08-08 DIAGNOSIS — Z8349 Family history of other endocrine, nutritional and metabolic diseases: Secondary | ICD-10-CM | POA: Diagnosis not present

## 2022-08-08 DIAGNOSIS — E063 Autoimmune thyroiditis: Secondary | ICD-10-CM | POA: Diagnosis present

## 2022-08-08 DIAGNOSIS — K5900 Constipation, unspecified: Secondary | ICD-10-CM | POA: Diagnosis not present

## 2022-08-08 DIAGNOSIS — G7100 Muscular dystrophy, unspecified: Secondary | ICD-10-CM | POA: Diagnosis not present

## 2022-08-08 DIAGNOSIS — Z823 Family history of stroke: Secondary | ICD-10-CM | POA: Diagnosis not present

## 2022-08-08 DIAGNOSIS — F32A Depression, unspecified: Secondary | ICD-10-CM | POA: Diagnosis present

## 2022-08-08 DIAGNOSIS — Z8049 Family history of malignant neoplasm of other genital organs: Secondary | ICD-10-CM | POA: Diagnosis not present

## 2022-08-08 DIAGNOSIS — G7102 Facioscapulohumeral muscular dystrophy: Secondary | ICD-10-CM | POA: Diagnosis present

## 2022-08-08 DIAGNOSIS — Z66 Do not resuscitate: Secondary | ICD-10-CM | POA: Diagnosis present

## 2022-08-08 DIAGNOSIS — J9601 Acute respiratory failure with hypoxia: Secondary | ICD-10-CM | POA: Diagnosis present

## 2022-08-08 DIAGNOSIS — Z8249 Family history of ischemic heart disease and other diseases of the circulatory system: Secondary | ICD-10-CM | POA: Diagnosis not present

## 2022-08-08 DIAGNOSIS — Z96651 Presence of right artificial knee joint: Secondary | ICD-10-CM | POA: Diagnosis present

## 2022-08-08 DIAGNOSIS — J690 Pneumonitis due to inhalation of food and vomit: Secondary | ICD-10-CM | POA: Diagnosis present

## 2022-08-08 DIAGNOSIS — Z79890 Hormone replacement therapy (postmenopausal): Secondary | ICD-10-CM | POA: Diagnosis not present

## 2022-08-08 DIAGNOSIS — R1310 Dysphagia, unspecified: Secondary | ICD-10-CM | POA: Diagnosis present

## 2022-08-08 DIAGNOSIS — Z820 Family history of epilepsy and other diseases of the nervous system: Secondary | ICD-10-CM | POA: Diagnosis not present

## 2022-08-08 DIAGNOSIS — F40240 Claustrophobia: Secondary | ICD-10-CM | POA: Diagnosis present

## 2022-08-08 DIAGNOSIS — Z79899 Other long term (current) drug therapy: Secondary | ICD-10-CM | POA: Diagnosis not present

## 2022-08-08 DIAGNOSIS — Z7401 Bed confinement status: Secondary | ICD-10-CM | POA: Diagnosis not present

## 2022-08-08 LAB — CULTURE, BLOOD (ROUTINE X 2): Culture: NO GROWTH

## 2022-08-08 MED ORDER — ENSURE ENLIVE PO LIQD
237.0000 mL | Freq: Three times a day (TID) | ORAL | Status: DC
  Administered 2022-08-08 – 2022-08-12 (×7): 237 mL via ORAL

## 2022-08-08 MED ORDER — ADULT MULTIVITAMIN W/MINERALS CH
1.0000 | ORAL_TABLET | Freq: Every day | ORAL | Status: DC
  Administered 2022-08-09 – 2022-08-12 (×4): 1 via ORAL
  Filled 2022-08-08 (×5): qty 1

## 2022-08-08 MED ORDER — ONDANSETRON HCL 4 MG/2ML IJ SOLN
4.0000 mg | Freq: Once | INTRAMUSCULAR | Status: AC
  Administered 2022-08-08: 4 mg via INTRAVENOUS
  Filled 2022-08-08: qty 2

## 2022-08-08 MED ORDER — LEVOTHYROXINE SODIUM 50 MCG PO TABS
125.0000 ug | ORAL_TABLET | Freq: Every day | ORAL | Status: DC
  Administered 2022-08-09 – 2022-08-12 (×4): 125 ug via ORAL
  Filled 2022-08-08 (×4): qty 1

## 2022-08-08 NOTE — Evaluation (Signed)
Physical Therapy Evaluation Patient Details Name: [name] MRN: [id number] DOB: 1960/08/12 Today's Date: 08/08/2022  History of Present Illness  Pt is a 62 y.o. female presenting to hospital 08/06/22 with c/o SOB (pt felt like she was aspirating).  Pt admitted with choking, dysphagia, acute hypoxic respiratory failure, SIRS, B leg edema, and muscular dystrophy.  PMH includes muscular dystrophy, hypothyroidism, depression, Hashiomoto's disease, heart murmur, Vitamin D deficiency, R TKA.  Clinical Impression  PT/OT co-evaluation performed.  Prior to hospital admission, pt was modified independent with lateral scoot transfers; uses electric scooter; pt reports living with 23 y.o. father on main level of home with ramp to enter; started with [place] (with hospice) about 1 week ago-- assists with bathing.   Pt A&Ox4.  Currently pt is max assist x2 semi-supine to sitting edge of bed; CGA to min assist x1 plus 2nd SBA (in front of pt d/t pt concerned with falling forward) for sitting balance; and max assist x2 lateral scoot transfer to R bed to recliner.  Significant B UE/LE weakness noted (pt reporting being weaker than typical) requiring significant increased assist from baseline.  Pt would currently benefit from skilled PT to address noted impairments and functional limitations (see below for any additional details).  Upon hospital discharge, pt would benefit from ongoing therapy.    Recommendations for follow up therapy are one component of a multi-disciplinary discharge planning process, led by the attending physician.  Recommendations may be updated based on patient status, additional functional criteria and insurance authorization.  Follow Up Recommendations Can patient physically be transported by private vehicle: No     Assistance Recommended at Discharge Frequent or constant Supervision/Assistance  Patient can return home with the following  Two people to help with walking and/or  transfers;A lot of help with bathing/dressing/bathroom;Assistance with cooking/housework;Assistance with feeding;Direct supervision/assist for medications management;Assist for transportation;Help with stairs or ramp for entrance    Equipment Recommendations  (TBD at next facility)  Recommendations for Other Services       Functional Status Assessment Patient has had a recent decline in their functional status and demonstrates the ability to make significant improvements in function in a reasonable and predictable amount of time.     Precautions / Restrictions Precautions Precautions: Fall Precaution Comments: Aspiration Restrictions Weight Bearing Restrictions: No      Mobility  Bed Mobility Overal bed mobility: Needs Assistance Bed Mobility: Supine to Sit     Supine to sit: Max assist, +2 for physical assistance, HOB elevated     General bed mobility comments: assist for trunk and B LE's; use of bed pad    Transfers Overall transfer level: Needs assistance Equipment used: None Transfers: Bed to chair/wheelchair/BSC            Lateral/Scoot Transfers: Max assist, +2 physical assistance General transfer comment: lateral scoot transfer bed to recliner (towards R side) with max assist x2 and use of bed pad under pt; vc's for technique    Ambulation/Gait               General Gait Details: Deferred (pt reports being non-ambulatory baseline)  Stairs            Wheelchair Mobility    Modified Rankin (Stroke Patients Only)       Balance Overall balance assessment: Needs assistance Sitting-balance support: Feet supported, Bilateral upper extremity supported Sitting balance-Leahy Scale: Poor Sitting balance - Comments: sitting balance fluctuating between CGA to min assist x1 plus 2nd assist for safety (in  front of pt d/t pt reporting concerns of falling forwards)                                     Pertinent Vitals/Pain Pain  Assessment Pain Assessment: No/denies pain HR 113 bpm with O2 94% on room air beginning of session.    Home Living Family/patient expects to be discharged to:: Private residence Living Arrangements: Parent (pt reports her 41 y.o. father) Available Help at Discharge: Family;Available PRN/intermittently Type of Home: House Home Access: Ramped entrance       Home Layout: Multi-level;Able to live on main level with bedroom/bathroom Home Equipment: Art gallery manager;Wheelchair - power;Tub bench;Other (comment) Additional Comments: slideboard; hydraulic lift on toilet; power shower chair; lift recliner    Prior Function Prior Level of Function : Needs assist             Mobility Comments: Modified independent with lateral transfers surface to surface (pt reports she does not walk or stand); sleeps in lift recliner ADLs Comments: Modified independent dressing self (sits on toilet to get dressed); Genevieve Norlander with hospice started about 1 week ago--comes to assist with bathing     Hand Dominance        Extremity/Trunk Assessment   Upper Extremity Assessment Upper Extremity Assessment: Defer to OT evaluation (Signficant B UE weakness noted (see OT eval for details))    Lower Extremity Assessment Lower Extremity Assessment: RLE deficits/detail;LLE deficits/detail RLE Deficits / Details: hip flexion 2+/5; knee flexion/extension 2+/5; able to wiggle toes; pt's ankle/foot contracted into PF LLE Deficits / Details: hip flexion 2/5; knee flexion/extension 2/5; able to wiggle toes; pt's ankle/foot contracted into PF    Cervical / Trunk Assessment Cervical / Trunk Assessment: Other exceptions Cervical / Trunk Exceptions: forward head/shoulders; difficulty holding head up in certain positions  Communication   Communication: No difficulties  Cognition Arousal/Alertness: Awake/alert Behavior During Therapy: WFL for tasks assessed/performed, Anxious Overall Cognitive Status: Within Functional  Limits for tasks assessed                                          General Comments  Nursing cleared pt for participation in physical therapy.  Pt agreeable to PT/OT session.    Exercises     Assessment/Plan    PT Assessment Patient needs continued PT services  PT Problem List Decreased strength;Decreased activity tolerance;Decreased balance;Decreased mobility       PT Treatment Interventions DME instruction;Functional mobility training;Therapeutic activities;Therapeutic exercise;Balance training;Patient/family education    PT Goals (Current goals can be found in the Care Plan section)  Acute Rehab PT Goals Patient Stated Goal: to improve strength and functional mobility PT Goal Formulation: With patient Time For Goal Achievement: 08/22/22 Potential to Achieve Goals: Good    Frequency Min 3X/week     Co-evaluation PT/OT/SLP Co-Evaluation/Treatment: Yes Reason for Co-Treatment: For patient/therapist safety;To address functional/ADL transfers PT goals addressed during session: Mobility/safety with mobility;Balance OT goals addressed during session: ADL's and self-care       AM-PAC PT "6 Clicks" Mobility  Outcome Measure Help needed turning from your back to your side while in a flat bed without using bedrails?: A Lot Help needed moving from lying on your back to sitting on the side of a flat bed without using bedrails?: Total Help needed moving to and from a bed  to a chair (including a wheelchair)?: Total Help needed standing up from a chair using your arms (e.g., wheelchair or bedside chair)?: Total Help needed to walk in hospital room?: Total Help needed climbing 3-5 steps with a railing? : Total 6 Click Score: 7    End of Session Equipment Utilized During Treatment: Gait belt Activity Tolerance: Patient tolerated treatment well Patient left: in chair;with call bell/phone within reach;with chair alarm set;Other (comment) (B heels floating via pillow  support) Nurse Communication: Mobility status;Need for lift equipment;Precautions;Other (comment) (pt needing hoyer lift; hoyer lift pad left in room) PT Visit Diagnosis: Other abnormalities of gait and mobility (R26.89);Muscle weakness (generalized) (M62.81)    Time: 1610-9604 PT Time Calculation (min) (ACUTE ONLY): 31 min   Charges:   PT Evaluation $PT Eval Low Complexity: 1 Low PT Treatments $Therapeutic Activity: 8-22 mins       Hendricks Limes, PT 08/08/22, 10:48 AM

## 2022-08-08 NOTE — Progress Notes (Signed)
Initial Nutrition Assessment  DOCUMENTATION CODES:   Not applicable  INTERVENTION:   Ensure Enlive po BID, each supplement provides 350 kcal and 20 grams of protein.  Magic cup TID with meals, each supplement provides 290 kcal and 9 grams of protein  MVI po daily   NUTRITION DIAGNOSIS:   Increased nutrient needs related to chronic illness as evidenced by estimated needs.  GOAL:   Patient will meet greater than or equal to 90% of their needs  MONITOR:   PO intake, Supplement acceptance, Labs, Weight trends, Skin, I & O's  REASON FOR ASSESSMENT:   Consult Poor PO  ASSESSMENT:   62 y.o. female with medical history significant for muscular dystrophy, hypothyroidism, depression and HLD who is admitted with dysphagia.  RD working remotely.  RD unable to reach pt by phone. RD received consult for patient with poor oral intake. Per chart, pt reports food getting stuck in her throat and difficulty swallowing after eating eggs and toast. Pt seen by SLP and was initiated on a dysphagia 3/thin liquid diet. Pt documented to be eating 100% of meals today. RD will add supplements and MVI to help pt meet her estimated needs. There is not a good documented weight history in chart as pt is unable to stand on a scale. Pt's admission weight is ~29lbs(15%) down from her last admission weight in April; RD unsure if bed weights are correct. RD will obtain nutrition related history and exam at follow up.    Medications reviewed and include: azithromycin, heparin, synthroid, protonix, ceftriaxone, LRS @100ml /hr  Labs reviewed: K 3.5 wnl, creat <0.30(L)  NUTRITION - FOCUSED PHYSICAL EXAM: Unable to perform at this time  Diet Order:   Diet Order             DIET DYS 3 Room service appropriate? Yes with Assist; Fluid consistency: Thin  Diet effective now                  EDUCATION NEEDS:   No education needs have been identified at this time  Skin:  Skin Assessment: Reviewed RN  Assessment (ecchymosis)  Last BM:  6/20- type 6  Height:   Ht Readings from Last 1 Encounters:  08/06/22 5\' 5"  (1.651 m)    Weight:   Wt Readings from Last 1 Encounters:  08/07/22 77.8 kg    Ideal Body Weight:  56.8 kg  BMI:  Body mass index is 28.54 kg/m.  Estimated Nutritional Needs:   Kcal:  1700-2000kcal/day  Protein:  85-100g/day  Fluid:  1.7-2.0L/day  Betsey Holiday MS, RD, LDN Please refer to [facility] for RD and/or RD on-call/weekend/after hours pager

## 2022-08-08 NOTE — TOC Progression Note (Addendum)
Transition of Care [facility]) - Progression Note    Patient Details  Name: [name] MRN: [id number] Date of Birth: [birth year]  [facility] CM/SW Contact  Kreg Shropshire, RN Phone Number: 08/08/2022, 12:04 PM  Clinical Narrative:    50- Spoke with son regarding d/c plan. PT/OT recommends SNF. Cm gave list from [email or web address]. Cm will complete bed search.  1500- Pending bed search, pt [facility] choice [facility]. [facility] is under old last name. Informed son to bring updated ID to get scanned in. Pt wanted options for paying for long term care due to lack of caregiver support. Sent secure email to [employer] for additional help.   Expected Discharge Plan: Skilled Nursing Facility Barriers to Discharge: Continued Medical Work up  Expected Discharge Plan and Services                                               Social Determinants of Health (SDOH) Interventions SDOH Screenings   Food Insecurity: No Food Insecurity (08/07/2022)  Housing: Low Risk  (08/07/2022)  Transportation Needs: No Transportation Needs (08/07/2022)  Utilities: Not At Risk (08/07/2022)  Alcohol Screen: Low Risk  (01/17/2017)  Tobacco Use: Low Risk  (08/07/2022)    Readmission Risk Interventions     No data to display

## 2022-08-08 NOTE — NC FL2 (Signed)
[place] MEDICAID FL2 LEVEL OF CARE FORM     IDENTIFICATION  Patient Name: [name] Birthdate: 03-May-1960 Sex: female Admission Date (Current Location): 08/06/2022  [facility] and [place] Number:  Chiropodist and Address:         Provider Number: [id number]  Attending Physician Name and Address:  Leeroy Bock, MD  Relative Name and Phone Number:  Rande Lawman [phone]    Current Level of Care: Hospital Recommended Level of Care: Skilled Nursing Facility Prior Approval Number:    Date Approved/Denied:   PASRR Number: [id number]  Discharge Plan: SNF    Current Diagnoses: Patient Active Problem List   Diagnosis Date Noted   Respiratory distress 08/07/2022   Aspiration into airway 08/07/2022   Choking 08/06/2022   Bilateral leg edema 08/06/2022   SIRS (systemic inflammatory response syndrome) (HCC) 08/06/2022   Anxiety state 07/23/2016   Severe recurrent major depression without psychotic features (HCC) 04/16/2016   Hypothyroidism 04/16/2016   OVERFLOW INCONTINENCE 02/21/2009   HYPERCHOLESTEROLEMIA 01/07/2008   MUSCULAR DYSTROPHY 01/07/2008   CARDIAC MURMUR, BENIGN 01/07/2008    Orientation RESPIRATION BLADDER Height & Weight     Situation, Self, Time, Place  Normal Indwelling catheter Weight: 77.8 kg Height:  5\' 5"  (165.1 cm)  BEHAVIORAL SYMPTOMS/MOOD NEUROLOGICAL BOWEL NUTRITION STATUS      Continent  (Dyphagia)  AMBULATORY STATUS COMMUNICATION OF NEEDS Skin   Extensive Assist Verbally Normal                       Personal Care Assistance Level of Assistance  Bathing, Feeding, Dressing Bathing Assistance: Limited assistance Feeding assistance: Limited assistance Dressing Assistance: Limited assistance     Functional Limitations Info             SPECIAL CARE FACTORS FREQUENCY  PT (By licensed PT), OT (By licensed OT)                    Contractures Contractures Info:  (Muscular Dytrophy)     Additional Factors Info  Code Status Code Status Info: DNR             Current Medications (08/08/2022):  This is the current hospital active medication list Current Facility-Administered Medications  Medication Dose Route Frequency Provider Last Rate Last Admin   acetaminophen (TYLENOL) tablet 650 mg  650 mg Oral Q6H PRN Gertha Calkin, MD   650 mg at 08/07/22 0857   Or   acetaminophen (TYLENOL) suppository 650 mg  650 mg Rectal Q6H PRN Gertha Calkin, MD       azithromycin [facility]) tablet 500 mg  500 mg Oral q1800 Tressie Ellis, RPH       cefTRIAXone (ROCEPHIN) 2 g in sodium chloride 0.9 % 100 mL IVPB  2 g Intravenous Q24H Irena Cords V, MD 200 mL/hr at 08/07/22 1558 2 g at 08/07/22 1558   heparin injection 5,000 Units  5,000 Units Subcutaneous Q8H Gertha Calkin, MD   5,000 Units at 08/08/22 6295   lactated ringers infusion   Intravenous Continuous Leeroy Bock, MD 100 mL/hr at 08/08/22 0200 New Bag at 08/08/22 0200   levothyroxine (SYNTHROID) tablet 112 mcg  112 mcg Oral Q0600 Gertha Calkin, MD   112 mcg at 08/08/22 2841   morphine (PF) 2 MG/ML injection 2 mg  2 mg Intravenous Q4H PRN Gertha Calkin, MD       pantoprazole (PROTONIX) injection 40 mg  40 mg  Intravenous Q12H Irena Cords V, MD   40 mg at 08/08/22 0929   sodium chloride flush (NS) 0.9 % injection 3 mL  3 mL Intravenous Q12H Irena Cords V, MD   3 mL at 08/08/22 0929   venlafaxine XR (EFFEXOR-XR) 24 hr capsule 75 mg  75 mg Oral QHS Andris Baumann, MD   75 mg at 08/07/22 2154     Discharge Medications: Please see discharge summary for a list of discharge medications.  Relevant Imaging Results:  Relevant Lab Results:   Additional Information SS# [id number]  Kreg Shropshire, RN

## 2022-08-08 NOTE — Care Management Obs Status (Signed)
MEDICARE OBSERVATION STATUS NOTIFICATION   Patient Details  Name: [name] MRN: [id number] Date of Birth: 1960/12/12   Medicare Observation Status Notification Given:  Yes    Kreg Shropshire, RN 08/08/2022, 9:58 AM

## 2022-08-08 NOTE — Progress Notes (Signed)
NIF is -30 today

## 2022-08-08 NOTE — Evaluation (Signed)
Occupational Therapy Evaluation Patient Details Name: [name] MRN: [id number] DOB: 1960/07/31 Today's Date: 08/08/2022   History of Present Illness Pt is a 62 y.o. female presenting to hospital 08/06/22 with c/o SOB (pt felt like she was aspirating).  Pt admitted with choking, dysphagia, acute hypoxic respiratory failure, SIRS, B leg edema, and muscular dystrophy.  PMH includes muscular dystrophy, hypothyroidism, depression, Hashiomoto's disease, heart murmur, Vitamin D deficiency, R TKA.   Clinical Impression   Chart reviewed, pt greeted in room, alert and oriented x4, agreeable to OT evaluation. Co tx completed with PT on this date. Pt appears anxious regarding current functional status. PTA pt reports performing ADL with MOD I, however increasing difficulties recently especially with showering and dressing. Pt is non ambulatory and laterally scoots to power scooter or lift chair where she sleeps. Pt presents with deficits in strength, endurance, activity tolerance, balance, all affecting safe and optimal ADL completion. Pt is performing ADL below previous baseline, would benefit from skilled OT to address deficits. OT will continue to follow acutely.      Recommendations for follow up therapy are one component of a multi-disciplinary discharge planning process, led by the attending physician.  Recommendations may be updated based on patient status, additional functional criteria and insurance authorization.   Assistance Recommended at Discharge Intermittent Supervision/Assistance  Patient can return home with the following Two people to help with walking and/or transfers;Two people to help with bathing/dressing/bathroom    Functional Status Assessment  Patient has had a recent decline in their functional status and demonstrates the ability to make significant improvements in function in a reasonable and predictable amount of time.  Equipment Recommendations  Other (comment) (per  next venue of care)    Recommendations for Other Services       Precautions / Restrictions Precautions Precautions: Fall Precaution Comments: Aspiration Restrictions Weight Bearing Restrictions: No      Mobility Bed Mobility Overal bed mobility: Needs Assistance Bed Mobility: Supine to Sit     Supine to sit: Max assist, +2 for physical assistance, HOB elevated          Transfers Overall transfer level: Needs assistance Equipment used: None Transfers: Bed to chair/wheelchair/BSC            Lateral/Scoot Transfers: Max assist, +2 physical assistance General transfer comment: lateral scoot to the R with step by step verbal cues;      Balance Overall balance assessment: Needs assistance Sitting-balance support: Feet supported, Bilateral upper extremity supported Sitting balance-Leahy Scale: Poor Sitting balance - Comments: CGA-MIN A throughout, fair cervical strength Postural control: Posterior lean (anterior lean)   Standing balance-Leahy Scale: Zero Standing balance comment: pt does not stand, is non ambulatory                           ADL either performed or assessed with clinical judgement   ADL Overall ADL's : Needs assistance/impaired Eating/Feeding: Moderate assistance;Sitting Eating/Feeding Details (indicate cue type and reason): due to RUE weakness Grooming: Wash/dry face;Moderate assistance;Sitting Grooming Details (indicate cue type and reason): sitting with support         Upper Body Dressing : Minimal assistance;Moderate assistance;Bed level Upper Body Dressing Details (indicate cue type and reason): doff shirt, donn gown Lower Body Dressing: Maximal assistance Lower Body Dressing Details (indicate cue type and reason): socks Toilet Transfer: Maximal assistance;+2 for safety/equipment Toilet Transfer Details (indicate cue type and reason): lateral scoot, simulated to bedside chair with step  by step cues for  encouragement Toileting- Clothing Manipulation and Hygiene: Maximal assistance Toileting - Clothing Manipulation Details (indicate cue type and reason): anticipated       General ADL Comments: pt reports progressive weakness recently affecting completion of all ADLs where she was previously MOD I     Vision Patient Visual Report: No change from baseline       Perception     Praxis      Pertinent Vitals/Pain Pain Assessment Pain Assessment: No/denies pain     Hand Dominance     Extremity/Trunk Assessment Upper Extremity Assessment Upper Extremity Assessment: Generalized weakness;RUE deficits/detail;LUE deficits/detail RUE Deficits / Details: RUE AROM: shoulder approx 1/4 full AROM, elbow unable, wrist unable; PROM WFL; hypotonic throughout;  fair-poor grip strength with pt functionally able to hold fork with SET UP, but unable to bring to her mouth; Pt is able to operate cell phone when placed near her. Pt is R hand dominant;sensation appears WFL will continue to assess RUE Coordination: decreased gross motor;decreased fine motor LUE Deficits / Details: LUE AROM: shoulder approx 1/4 full AROM, elbow unable, wrist unable; PROM WFL; hypotonic throughout; poor grip strength,  unable to hold items in L hand; Pt is R hand dominant;sensation appears WFL will continue to assess LUE Coordination: decreased fine motor;decreased gross motor   Lower Extremity Assessment Lower Extremity Assessment: Defer to PT evaluation;Generalized weakness RLE Deficits / Details: hip flexion 2+/5; knee flexion/extension 2+/5; able to wiggle toes; pt's ankle/foot contracted into PF LLE Deficits / Details: hip flexion 2/5; knee flexion/extension 2/5; able to wiggle toes; pt's ankle/foot contracted into PF   Cervical / Trunk Assessment Cervical / Trunk Assessment: Other exceptions Cervical / Trunk Exceptions: cervical neck flexion with noted weakness   Communication Communication Communication: No  difficulties   Cognition Arousal/Alertness: Awake/alert Behavior During Therapy: WFL for tasks assessed/performed, Anxious Overall Cognitive Status: Within Functional Limits for tasks assessed                                       General Comments  vital signs monitored, appear stable throughout;    Exercises Other Exercises Other Exercises: edu re: role of OT, role of rehab, recommendations, DME use, home safety, ADL independence   Shoulder Instructions      Home Living Family/patient expects to be discharged to:: Private residence Living Arrangements: Parent (pt reports 96 year old father) Available Help at Discharge: Family;Available PRN/intermittently Type of Home: House Home Access: Ramped entrance     Home Layout: Multi-level;Able to live on main level with bedroom/bathroom     Bathroom Shower/Tub: Tub/shower unit   [employer]: Standard (hydraulic lift on toilet)     Home Equipment: Art gallery manager;Wheelchair - power;Tub bench;Other (comment)   Additional Comments: slideboard; hydraulic lift on toilet; power shower chair; lift recliner; does not use power chair, spends all day in scooter/lift chair      Prior Functioning/Environment Prior Level of Function : Needs assist       Physical Assist : ADLs (physical)     Mobility Comments: Pt reports MOD I for lateral scoot transfers to toilet, shower (which has gotten increasingly more difficult), sleeps in lift chair; non ambulatory ADLs Comments: Feeding with MOD I with no AD (L hand assists R hand however), MOD I for grooming, MOD I dressing on toilet, MOD I for toileting however recent difficutly showering, [facility] startedapprox 1 week ago  to assist with bathing        OT Problem List: Decreased strength;Impaired balance (sitting and/or standing);Decreased activity tolerance;Decreased knowledge of use of DME or AE;Decreased coordination;Impaired UE functional use      OT  Treatment/Interventions: Self-care/ADL training;DME and/or AE instruction;Therapeutic activities;Balance training;Therapeutic exercise;Patient/family education;Modalities    OT Goals(Current goals can be found in the care plan section) Acute Rehab OT Goals Patient Stated Goal: get stronger OT Goal Formulation: With patient Time For Goal Achievement: 08/22/22 Potential to Achieve Goals: Good ADL Goals Pt Will Perform Grooming: with min assist;sitting;with adaptive equipment Pt Will Perform Lower Body Dressing: sitting/lateral leans;with min guard assist Pt Will Transfer to Toilet: with min assist Pt Will Perform Toileting - Clothing Manipulation and hygiene: with min assist;sitting/lateral leans  OT Frequency: Min 2X/week    Co-evaluation PT/OT/SLP Co-Evaluation/Treatment: Yes Reason for Co-Treatment: For patient/therapist safety;To address functional/ADL transfers PT goals addressed during session: Mobility/safety with mobility;Balance OT goals addressed during session: ADL's and self-care      AM-PAC OT "6 Clicks" Daily Activity     Outcome Measure Help from another person eating meals?: A Lot Help from another person taking care of personal grooming?: A Lot Help from another person toileting, which includes using toliet, bedpan, or urinal?: Total Help from another person bathing (including washing, rinsing, drying)?: A Lot Help from another person to put on and taking off regular upper body clothing?: A Little Help from another person to put on and taking off regular lower body clothing?: A Lot 6 Click Score: 12   End of Session Nurse Communication: Mobility status  Activity Tolerance: Patient tolerated treatment well Patient left: in chair;with call bell/phone within reach;with chair alarm set  OT Visit Diagnosis: Muscle weakness (generalized) (M62.81);Other abnormalities of gait and mobility (R26.89)                Time: 1308-6578 OT Time Calculation (min): 30 min Charges:   OT General Charges $OT Visit: 1 Visit OT Evaluation $OT Eval Moderate Complexity: 1 Mod Oleta Mouse, OTD OTR/L  08/08/22, 12:02 PM

## 2022-08-08 NOTE — Progress Notes (Addendum)
PROGRESS NOTE  [name]    DOB: 10-Jan-1961, 62 y.o.  MVH:[id number]    Code Status: DNR   DOA: 08/06/2022   LOS: 0   Brief hospital course  [name] is a 62 y.o. female with a PMH significant for muscular dystrophy, hypothyroidism, depression, hypothyroidism, HLD, cardiac murmur.  They presented from home to the ED on 08/06/2022 with difficulty swallowing x several days. She was choking on food/drink and also had difficulty with swallowing her secretions. Denies prior difficulty with this.   In the ED, it was found that they had HR 111, temp 98.1, RR 30, BP 116/89, 96% O2 on 2L [place].  Significant findings included completely normal CBC, and unremarkable metabolic panel. Negative troponin, D-dimer and LA. Urinalysis positive for ketones.   They were initially treated with home medications, azithromycin and CTX.   Patient was admitted to medicine service for further workup and management of dysphagia as outlined in detail below.  6/20: SLP evaluation- general aspiration precautions and mechanical soft diet for energy preservation.  08/08/22 -stable. PT/OT evaluated and recommended SNF. TOC engaged for placement  Assessment & Plan  Principal Problem:   Choking Active Problems:   SIRS (systemic inflammatory response syndrome) (HCC)   Bilateral leg edema   Hypothyroidism   MUSCULAR DYSTROPHY   Respiratory distress   Aspiration into airway  Dysphagia- SLP evaluation- general aspiration precautions and mechanical soft diet for energy preservation.  - SLP evaluation - IVF maintenance  Acute hypoxic respiratory failure- resolved. initially on 2Lnc and = now stable on room air. Denies respiratory complaints. Possibly related to aspiration.  - monitor breathing  Muscular dystrophy- with progressing disease, patient wants to know options for residential care - PT/OT- recommending SNF - SLP - TOC - follow up with neurology  Constipation- resolved s/p enema -  continue miralax  Hypothyroidism - TSH was 15.62 on 5/20. May be contributing to worsening weakness. 5.783 this admission so has improved. Last dose increase was about a month ago so will increase again slightly and recheck in about 3-4 weeks.  - continue levothyroxine at increased dose.  Body mass index is 28.54 kg/m.  VTE ppx: heparin injection 5,000 Units Start: 08/06/22 2230  Diet:     Diet   DIET DYS 3 Room service appropriate? Yes with Assist; Fluid consistency: Thin   Consultants: None   Subjective 08/08/22    Pt reports feeling well today. Her biggest concerns are regarding her follow up placement after hospitalization and being able to blow out which were addressed in encounter.    Objective   Vitals:   08/07/22 1000 08/07/22 1546 08/07/22 2016 08/08/22 0340  BP: 98/80 110/81 (!) 121/91 111/88  Pulse: 100 (!) 109 (!) 105 (!) 107  Resp:  18  17  Temp:  98.3 F (36.8 C) 98.1 F (36.7 C) 98.6 F (37 C)  TempSrc:  Oral Oral   SpO2:  94% 93% 94%  Weight:      Height:        Intake/Output Summary (Last 24 hours) at 08/08/2022 0735 Last data filed at 08/08/2022 0600 Gross per 24 hour  Intake 1643.43 ml  Output 1 ml  Net 1642.43 ml    Filed Weights   08/06/22 1312 08/07/22 0414  Weight: 71.7 kg 77.8 kg    Physical Exam:  General: awake, alert, NAD HEENT: atraumatic, clear conjunctiva, anicteric sclera, MMM, hearing grossly normal Respiratory: normal respiratory effort. CTAB Cardiovascular: quick capillary refill, normal S1/S2, RRR, no  JVD, murmurs Nervous: A&O x3. no gross focal neurologic deficits, normal speech Extremities: non-pitting edema diffusely, low muscle tone Skin: dry, intact, normal temperature, normal color. No rashes, lesions or ulcers on exposed skin Psychiatry: normal mood, congruent affect  Labs   I have personally reviewed the following labs and imaging studies CBC    Component Value Date/Time   WBC 9.9 08/07/2022 0428   RBC 3.98  08/07/2022 0428   HGB 12.4 08/07/2022 0428   HGB 10.3 (L) 02/19/2014 0421   HCT 39.6 08/07/2022 0428   HCT 31.0 (L) 02/19/2014 0421   PLT 245 08/07/2022 0428   PLT 251 02/19/2014 0421   MCV 99.5 08/07/2022 0428   MCV 94 02/19/2014 0421   MCH 31.2 08/07/2022 0428   MCHC 31.3 08/07/2022 0428   RDW 12.2 08/07/2022 0428   RDW 13.1 02/19/2014 0421   LYMPHSABS 1.5 08/06/2022 1518   LYMPHSABS 1.2 02/19/2014 0421   MONOABS 0.7 08/06/2022 1518   MONOABS 0.8 02/19/2014 0421   EOSABS 0.0 08/06/2022 1518   EOSABS 0.2 02/19/2014 0421   BASOSABS 0.0 08/06/2022 1518   BASOSABS 0.0 02/19/2014 0421      Latest Ref Rng & Units 08/07/2022    4:28 AM 08/06/2022    3:18 PM 07/05/2022   12:34 PM  BMP  Glucose 70 - 99 mg/dL 77  85  846   BUN 6 - 20 mg/dL 8  12  13    Creatinine 0.44 - 1.00 mg/dL <9.62  <9.52  <8.41   Sodium 135 - 145 mmol/L 138  136  137   Potassium 3.5 - 5.1 mmol/L 3.5  4.1  3.9   Chloride 98 - 111 mmol/L 100  95  98   CO2 22 - 32 mmol/L 28  29  31    Calcium 8.9 - 10.3 mg/dL 8.9  9.5  9.3     CT Angio Chest PE W/Cm &/Or Wo Cm  Result Date: 08/06/2022 CLINICAL DATA:  Shortness of breath EXAM: CT ANGIOGRAPHY CHEST WITH CONTRAST TECHNIQUE: Multidetector CT imaging of the chest was performed using the standard protocol during bolus administration of intravenous contrast. Multiplanar CT image reconstructions and MIPs were obtained to evaluate the vascular anatomy. RADIATION DOSE REDUCTION: This exam was performed according to the departmental dose-optimization program which includes automated exposure control, adjustment of the mA and/or kV according to patient size and/or use of iterative reconstruction technique. CONTRAST:  75mL OMNIPAQUE IOHEXOL 350 MG/ML SOLN COMPARISON:  Chest x-ray 08/06/2022 FINDINGS: Cardiovascular: Satisfactory opacification of the pulmonary arteries to the segmental level. No evidence of pulmonary embolism. Mild aortic atherosclerosis. Aneurysmal dilatation of the  ascending aorta measuring up to 4.3 cm. No dissection. Coronary vascular calcification. Normal cardiac size. No pericardial effusion Mediastinum/Nodes: No enlarged mediastinal, hilar, or axillary lymph nodes. Thyroid gland, trachea, and esophagus demonstrate no significant findings. Lungs/Pleura: Dependent atelectasis in the lower lobes. No consolidation, pleural effusion or pneumothorax Upper Abdomen: No acute finding Musculoskeletal: No acute osseous abnormality. Fatty atrophy of the paraspinal muscles. Review of the MIP images confirms the above findings. IMPRESSION: 1. Negative for acute pulmonary embolus. 2. Aortic atherosclerosis. Aneurysmal dilatation of ascending aorta up to 4.3 cm. Recommend annual imaging followup by CTA or MRA. This recommendation follows 2010 ACCF/AHA/AATS/ACR/ASA/SCA/SCAI/SIR/STS/SVM Guidelines for the Diagnosis and Management of Patients with Thoracic Aortic Disease. Circulation. 2010; 121: L244-W102. Aortic aneurysm NOS (ICD10-I71.9) Aortic Atherosclerosis (ICD10-I70.0). Aortic aneurysm NOS (ICD10-I71.9). Electronically Signed   By: Jasmine Pang M.D.   On: 08/06/2022 21:23  US Venous Img Lower Bilateral (DVT)  Result Date: 08/06/2022 CLINICAL DATA:  Bilateral lower extremity edema. EXAM: BILATERAL LOWER EXTREMITY VENOUS DOPPLER ULTRASOUND TECHNIQUE: Gray-scale sonography with graded compression, as well as color Doppler and duplex ultrasound were performed to evaluate the lower extremity deep venous systems from the level of the common femoral vein and including the common femoral, femoral, profunda femoral, popliteal and calf veins including the posterior tibial, peroneal and gastrocnemius veins when visible. The superficial great saphenous vein was also interrogated. Spectral Doppler was utilized to evaluate flow at rest and with distal augmentation maneuvers in the common femoral, femoral and popliteal veins. COMPARISON:  None Available. FINDINGS: RIGHT LOWER EXTREMITY Common  Femoral Vein: No evidence of thrombus. Normal compressibility, respiratory phasicity and response to augmentation. Saphenofemoral Junction: No evidence of thrombus. Normal compressibility and flow on color Doppler imaging. Profunda Femoral Vein: No evidence of thrombus. Normal compressibility and flow on color Doppler imaging. Femoral Vein: No evidence of thrombus. Normal compressibility, respiratory phasicity and response to augmentation. Popliteal Vein: No evidence of thrombus. Normal compressibility, respiratory phasicity and response to augmentation. Calf Veins: No evidence of thrombus. Normal compressibility and flow on color Doppler imaging. Superficial Great Saphenous Vein: No evidence of thrombus. Normal compressibility. Venous Reflux:  None. Other Findings:  None. LEFT LOWER EXTREMITY Common Femoral Vein: No evidence of thrombus. Normal compressibility, respiratory phasicity and response to augmentation. Saphenofemoral Junction: No evidence of thrombus. Normal compressibility and flow on color Doppler imaging. Profunda Femoral Vein: No evidence of thrombus. Normal compressibility and flow on color Doppler imaging. Femoral Vein: No evidence of thrombus. Normal compressibility, respiratory phasicity and response to augmentation. Popliteal Vein: No evidence of thrombus. Normal compressibility, respiratory phasicity and response to augmentation. Calf Veins: No evidence of thrombus. Normal compressibility and flow on color Doppler imaging. Superficial Great Saphenous Vein: No evidence of thrombus. Normal compressibility. Venous Reflux:  None. Other Findings:  None. IMPRESSION: No evidence of deep venous thrombosis in either lower extremity. Electronically Signed   By: Aram Candela M.D.   On: 08/06/2022 20:40   DG Chest Port 1 View  Result Date: 08/06/2022 CLINICAL DATA:  Sepsis EXAM: PORTABLE CHEST 1 VIEW COMPARISON:  X-ray 07/05/2022 FINDINGS: No consolidation, pneumothorax or effusion. No edema.  Normal cardiopericardial silhouette. Calcified aorta. Overlapping cardiac leads. Degenerative changes along the spine. IMPRESSION: No acute cardiopulmonary disease. Electronically Signed   By: Karen Kays M.D.   On: 08/06/2022 14:58    Disposition Plan & Communication  Patient status: Observation  Admitted From: Home Planned disposition location: SNF Anticipated discharge date: 6/24 pending placement  Family Communication: friend at bedside    Author: Leeroy Bock, DO [facility] 08/08/2022, 7:35 AM   Available by Epic secure chat 7AM-7PM. If 7PM-7AM, please contact night-coverage.  [facility] contact information found on [email or web address].

## 2022-08-08 NOTE — TOC PASRR Note (Cosign Needed)
Received Passar Number under pt name [name]. Pt last name changed to [name]. Cm asked if pt could bring updated ID to be scanned into chart.   [phone] [id number]

## 2022-08-09 DIAGNOSIS — G7100 Muscular dystrophy, unspecified: Secondary | ICD-10-CM | POA: Diagnosis not present

## 2022-08-09 DIAGNOSIS — R0603 Acute respiratory distress: Secondary | ICD-10-CM | POA: Diagnosis not present

## 2022-08-09 DIAGNOSIS — T17908A Unspecified foreign body in respiratory tract, part unspecified causing other injury, initial encounter: Secondary | ICD-10-CM | POA: Diagnosis not present

## 2022-08-09 LAB — CULTURE, BLOOD (ROUTINE X 2): Special Requests: ADEQUATE

## 2022-08-09 MED ORDER — ONDANSETRON HCL 4 MG/2ML IJ SOLN
4.0000 mg | Freq: Four times a day (QID) | INTRAMUSCULAR | Status: DC | PRN
  Administered 2022-08-09 – 2022-08-12 (×4): 4 mg via INTRAVENOUS
  Filled 2022-08-09 (×4): qty 2

## 2022-08-09 MED ORDER — GLYCERIN (LAXATIVE) 2 G RE SUPP
1.0000 | Freq: Every day | RECTAL | Status: DC | PRN
  Filled 2022-08-09: qty 1

## 2022-08-09 NOTE — Progress Notes (Signed)
FVC and NIF done at this time. FVC.57L, Very weak effort.  NIF -40cmH2O.

## 2022-08-09 NOTE — Progress Notes (Signed)
PROGRESS NOTE  [name]    DOB: 30-Jul-1960, 62 y.o.  GLO:[id number]    Code Status: DNR   DOA: 08/06/2022   LOS: 1   Brief hospital course  [name] is a 62 y.o. female with a PMH significant for muscular dystrophy, hypothyroidism, depression, hypothyroidism, HLD, cardiac murmur.  They presented from home to the ED on 08/06/2022 with difficulty swallowing x several days. She was choking on food/drink and also had difficulty with swallowing her secretions. Denies prior difficulty with this.   In the ED, it was found that they had HR 111, temp 98.1, RR 30, BP 116/89, 96% O2 on 2L [place].  Significant findings included completely normal CBC, and unremarkable metabolic panel. Negative troponin, D-dimer and LA. Urinalysis positive for ketones.   They were initially treated with home medications, azithromycin and CTX.   Patient was admitted to medicine service for further workup and management of dysphagia as outlined in detail below.  6/20: SLP evaluation- general aspiration precautions and mechanical soft diet for energy preservation.  6/21: PT/OT evaluated and recommended SNF. TOC engaged for placement. NIF of -30. Increased levothyroxine 08/09/22 -stable and awaiting SNF. Patient elects to trial Bipap at night  Assessment & Plan  Principal Problem:   Choking Active Problems:   SIRS (systemic inflammatory response syndrome) (HCC)   Bilateral leg edema   Hypothyroidism   MUSCULAR DYSTROPHY   Respiratory distress   Aspiration into airway   Muscular dystrophy (HCC)  Dysphagia- SLP evaluation- general aspiration precautions and mechanical soft diet for energy preservation.  - SLP evaluation- diet in per recommendations - IVF discontinued  Acute hypoxic respiratory failure  aspiration PNA- resolved. initially on 2Lnc and  now stable on room air. Denies respiratory complaints. Likely related to aspiration. NIF -30 on 6/21. Pt describes an O2 saturation reading in 70s  last night so was put on oxygen while sleeping and woke up with a headache, per patient. There is no documented desats and she is again stable ORA this morning.  - discussed case with pulmonology, Dr. Meredeth Ide who is on call and they recommended further CAP treatment and follow up outpatient for PFT monitoring as disease progresses. Also consider NIV treatment for sleep apnea.  - discussed with patient and she would like to trial bipap tonight.  - monitor breathing - recommend pulmonology referral for PFT after dc.  - continue Abx - please document any O2 desaturations and need for oxygen support in her chart  Muscular dystrophy- with progressing disease, patient wants to know options for residential care - PT/OT- recommending SNF. Awaiting placement.  - SLP - TOC - follow up with neurology  Constipation- resolved s/p enema - continue miralax  Hypothyroidism - TSH was 15.62 on 5/20 and her dose of levothyroxine was increased. Likely contributing to worsening weakness. 5.783 this admission so has improved. Increase again slightly and recheck TSH in about 3-4 weeks.  - continue levothyroxine at increased dose- .  Body mass index is 27.62 kg/m.  VTE ppx: heparin injection 5,000 Units Start: 08/06/22 2230  Diet:     Diet   DIET DYS 3 Room service appropriate? Yes with Assist; Fluid consistency: Thin   Consultants: None   Subjective 08/09/22    Pt reports feeling well today. She had a mild headache this morning which she attributes to oxygen wear overnight and it has since resolved after removing the oxygen. She would like to use NIV at night.  She wants to bring home depends  to use for comfort.    Objective   Vitals:   08/08/22 1549 08/08/22 2301 08/09/22 0443 08/09/22 0447  BP: (!) 124/96 113/85  (!) 126/95  Pulse: (!) 105 (!) 109  (!) 110  Resp: 18 20  20   Temp: 98.5 F (36.9 C) 98.3 F (36.8 C)  97.9 F (36.6 C)  TempSrc:  Oral  Oral  SpO2: 97% 100%  100%   Weight:   75.3 kg   Height:        Intake/Output Summary (Last 24 hours) at 08/09/2022 0732 Last data filed at 08/09/2022 0444 Gross per 24 hour  Intake 180 ml  Output 900 ml  Net -720 ml    Filed Weights   08/06/22 1312 08/07/22 0414 08/09/22 0443  Weight: 71.7 kg 77.8 kg 75.3 kg    Physical Exam:  General: awake, alert, NAD HEENT: atraumatic, clear conjunctiva, anicteric sclera, MMM, hearing grossly normal Respiratory: normal respiratory effort. CTAB Cardiovascular: quick capillary refill, normal S1/S2, RRR, no JVD, murmurs Nervous: A&O x3. no gross focal neurologic deficits, normal speech Extremities: non-pitting edema diffusely, low muscle tone Skin: dry, intact, normal temperature, normal color. No rashes, lesions or ulcers on exposed skin Psychiatry: normal mood, congruent affect  Labs   I have personally reviewed the following labs and imaging studies CBC    Component Value Date/Time   WBC 9.9 08/07/2022 0428   RBC 3.98 08/07/2022 0428   HGB 12.4 08/07/2022 0428   HGB 10.3 (L) 02/19/2014 0421   HCT 39.6 08/07/2022 0428   HCT 31.0 (L) 02/19/2014 0421   PLT 245 08/07/2022 0428   PLT 251 02/19/2014 0421   MCV 99.5 08/07/2022 0428   MCV 94 02/19/2014 0421   MCH 31.2 08/07/2022 0428   MCHC 31.3 08/07/2022 0428   RDW 12.2 08/07/2022 0428   RDW 13.1 02/19/2014 0421   LYMPHSABS 1.5 08/06/2022 1518   LYMPHSABS 1.2 02/19/2014 0421   MONOABS 0.7 08/06/2022 1518   MONOABS 0.8 02/19/2014 0421   EOSABS 0.0 08/06/2022 1518   EOSABS 0.2 02/19/2014 0421   BASOSABS 0.0 08/06/2022 1518   BASOSABS 0.0 02/19/2014 0421      Latest Ref Rng & Units 08/07/2022    4:28 AM 08/06/2022    3:18 PM 07/05/2022   12:34 PM  BMP  Glucose 70 - 99 mg/dL 77  85  409   BUN 6 - 20 mg/dL 8  12  13    Creatinine 0.44 - 1.00 mg/dL <8.11  <9.14  <7.82   Sodium 135 - 145 mmol/L 138  136  137   Potassium 3.5 - 5.1 mmol/L 3.5  4.1  3.9   Chloride 98 - 111 mmol/L 100  95  98   CO2 22 - 32 mmol/L  28  29  31    Calcium 8.9 - 10.3 mg/dL 8.9  9.5  9.3    Disposition Plan & Communication  Patient status: Inpatient  Admitted From: Home Planned disposition location: SNF Anticipated discharge date: 6/24 pending placement  Family Communication: none at bedside    Author: Leeroy Bock, DO [facility] 08/09/2022, 7:32 AM   Available by Epic secure chat 7AM-7PM. If 7PM-7AM, please contact night-coverage.  [facility] contact information found on [email or web address].

## 2022-08-09 NOTE — Progress Notes (Signed)
RT placed on bipap as ordered, but pt did not tolerate at this time.

## 2022-08-10 DIAGNOSIS — T17908A Unspecified foreign body in respiratory tract, part unspecified causing other injury, initial encounter: Secondary | ICD-10-CM | POA: Diagnosis not present

## 2022-08-10 DIAGNOSIS — G7100 Muscular dystrophy, unspecified: Secondary | ICD-10-CM | POA: Diagnosis not present

## 2022-08-10 DIAGNOSIS — R0603 Acute respiratory distress: Secondary | ICD-10-CM | POA: Diagnosis not present

## 2022-08-10 LAB — BASIC METABOLIC PANEL
Anion gap: 7 (ref 5–15)
BUN: 6 mg/dL (ref 6–20)
CO2: 33 mmol/L — ABNORMAL HIGH (ref 22–32)
Calcium: 9.1 mg/dL (ref 8.9–10.3)
Chloride: 92 mmol/L — ABNORMAL LOW (ref 98–111)
Creatinine, Ser: <0.3 mg/dL — ABNORMAL LOW (ref 0.44–1.00)
Glucose, Bld: 119 mg/dL — ABNORMAL HIGH (ref 70–99)
Potassium: 3.8 mmol/L (ref 3.5–5.1)
Sodium: 132 mmol/L — ABNORMAL LOW (ref 135–145)

## 2022-08-10 LAB — CBC
HCT: 42.4 % (ref 36.0–46.0)
Hemoglobin: 13 g/dL (ref 12.0–15.0)
MCH: 31 pg (ref 26.0–34.0)
MCHC: 30.7 g/dL (ref 30.0–36.0)
MCV: 101 fL — ABNORMAL HIGH (ref 80.0–100.0)
Platelets: 260 10*3/uL (ref 150–400)
RBC: 4.2 MIL/uL (ref 3.87–5.11)
RDW: 12.1 % (ref 11.5–15.5)
WBC: 6 10*3/uL (ref 4.0–10.5)
nRBC: 0 % (ref 0.0–0.2)

## 2022-08-10 LAB — CULTURE, BLOOD (ROUTINE X 2)

## 2022-08-10 NOTE — Progress Notes (Signed)
Patient's NIF was -35.

## 2022-08-10 NOTE — Progress Notes (Signed)
PROGRESS NOTE  [name]    DOB: 1960/03/07, 62 y.o.  ZOX:[id number]    Code Status: DNR   DOA: 08/06/2022   LOS: 2   Brief hospital course  [name] is a 62 y.o. female with a PMH significant for muscular dystrophy, hypothyroidism, depression, hypothyroidism, HLD, cardiac murmur.  They presented from home to the ED on 08/06/2022 with difficulty swallowing x several days. She was choking on food/drink and also had difficulty with swallowing her secretions. Denies prior difficulty with this.   In the ED, it was found that they had HR 111, temp 98.1, RR 30, BP 116/89, 96% O2 on 2L [place].  Significant findings included completely normal CBC, and unremarkable metabolic panel. Negative troponin, D-dimer and LA. Urinalysis positive for ketones.   They were initially treated with home medications, azithromycin and CTX.   Patient was admitted to medicine service for further workup and management of dysphagia as outlined in detail below.  6/20: SLP evaluation- general aspiration precautions and mechanical soft diet for energy preservation.  6/21: PT/OT evaluated and recommended SNF. TOC engaged for placement. NIF of -30. Increased levothyroxine 6/22: NIF -40, FVC 0.57. unable to tolerate bipap mask due to claustrophobia  6/23: NIF -35. Wants to try bipap again. Awaiting placement for SNF. Possible dc tomorrow  Assessment & Plan  Principal Problem:   Choking Active Problems:   SIRS (systemic inflammatory response syndrome) (HCC)   Bilateral leg edema   Hypothyroidism   MUSCULAR DYSTROPHY   Respiratory distress   Aspiration into airway   Muscular dystrophy (HCC)  Dysphagia- SLP evaluation- general aspiration precautions and mechanical soft diet for energy preservation.  - SLP evaluation- diet in per recommendations - IVF discontinued  Acute hypoxic respiratory failure  aspiration PNA- resolved. initially on 2Lnc and returned to room air. Likely related to aspiration.   Patient having oxygen desats when sleeping- apnea - discussed case with pulmonology, Dr. Meredeth Ide who is on call and they recommended further CAP treatment and follow up outpatient for PFT monitoring as disease progresses. Also consider NIV treatment for sleep apnea.  - discussed with patient and she would like to trial bipap again tonight. If unable to tolerate, oxygen when sleeping - monitor breathing - recommend pulmonology referral for PFT after dc.  - continue Abx - please document any O2 desaturations and need for oxygen support in her chart  Muscular dystrophy- with progressing disease, patient wants to know options for residential care - PT/OT- recommending SNF. Awaiting placement.  - SLP - TOC - follow up with neurology  Constipation- resolved s/p enema - continue miralax - suppositories PRN  Hypothyroidism - TSH was 15.62 on 5/20 and her dose of levothyroxine was increased. Likely contributing to worsening weakness. 5.783 this admission so has improved. Increase again slightly and recheck TSH in about 3-4 weeks.  - continue levothyroxine at increased dose- .  Body mass index is 27.62 kg/m.  VTE ppx: heparin injection 5,000 Units Start: 08/06/22 2230  Diet:     Diet   DIET DYS 3 Room service appropriate? Yes with Assist; Fluid consistency: Thin   Consultants: None   Subjective 08/10/22    Pt reports feeling tired today. She had intolerance to the Bipap mask yesterday night but thinks with some more adjustments and teaching she can use it and wants to try again.    Objective   Vitals:   08/09/22 2108 08/09/22 2131 08/10/22 0219 08/10/22 0522  BP:  101/82 110/82 100/82  Pulse: Marland Kitchen)  118 (!) 115 (!) 102 (!) 107  Resp:  18 16 16   Temp:  98.5 F (36.9 C) 98.7 F (37.1 C) 98.2 F (36.8 C)  TempSrc:  Oral Oral Oral  SpO2: 100% 99% 100% 96%  Weight:      Height:        Intake/Output Summary (Last 24 hours) at 08/10/2022 0724 Last data filed at 08/10/2022  0559 Gross per 24 hour  Intake 0 ml  Output 800 ml  Net -800 ml    Filed Weights   08/06/22 1312 08/07/22 0414 08/09/22 0443  Weight: 71.7 kg 77.8 kg 75.3 kg    Physical Exam:  General: awake, alert, NAD HEENT: atraumatic, clear conjunctiva, anicteric sclera, MMM, hearing grossly normal Respiratory: normal respiratory effort. CTAB Cardiovascular: quick capillary refill, normal S1/S2, RRR, no JVD, murmurs Nervous: A&O x3. no gross focal neurologic deficits, normal speech Extremities: non-pitting edema diffusely, low muscle tone Skin: dry, intact, normal temperature, normal color. No rashes, lesions or ulcers on exposed skin Psychiatry: normal mood, congruent affect  Labs   I have personally reviewed the following labs and imaging studies CBC    Component Value Date/Time   WBC 6.0 08/10/2022 0429   RBC 4.20 08/10/2022 0429   HGB 13.0 08/10/2022 0429   HGB 10.3 (L) 02/19/2014 0421   HCT 42.4 08/10/2022 0429   HCT 31.0 (L) 02/19/2014 0421   PLT 260 08/10/2022 0429   PLT 251 02/19/2014 0421   MCV 101.0 (H) 08/10/2022 0429   MCV 94 02/19/2014 0421   MCH 31.0 08/10/2022 0429   MCHC 30.7 08/10/2022 0429   RDW 12.1 08/10/2022 0429   RDW 13.1 02/19/2014 0421   LYMPHSABS 1.5 08/06/2022 1518   LYMPHSABS 1.2 02/19/2014 0421   MONOABS 0.7 08/06/2022 1518   MONOABS 0.8 02/19/2014 0421   EOSABS 0.0 08/06/2022 1518   EOSABS 0.2 02/19/2014 0421   BASOSABS 0.0 08/06/2022 1518   BASOSABS 0.0 02/19/2014 0421      Latest Ref Rng & Units 08/10/2022    4:29 AM 08/07/2022    4:28 AM 08/06/2022    3:18 PM  BMP  Glucose 70 - 99 mg/dL 469  77  85   BUN 6 - 20 mg/dL 6  8  12    Creatinine 0.44 - 1.00 mg/dL <6.29  <5.28  <4.13   Sodium 135 - 145 mmol/L 132  138  136   Potassium 3.5 - 5.1 mmol/L 3.8  3.5  4.1   Chloride 98 - 111 mmol/L 92  100  95   CO2 22 - 32 mmol/L 33  28  29   Calcium 8.9 - 10.3 mg/dL 9.1  8.9  9.5    Disposition Plan & Communication  Patient status: Inpatient   Admitted From: Home Planned disposition location: SNF Anticipated discharge date: 6/24 pending placement  Family Communication: none at bedside    Author: Leeroy Bock, DO [facility] 08/10/2022, 7:24 AM   Available by Epic secure chat 7AM-7PM. If 7PM-7AM, please contact night-coverage.  [facility] contact information found on [email or web address].

## 2022-08-10 NOTE — Progress Notes (Signed)
Pt able to do NIF -35cm, unable to do bedside spirometry.

## 2022-08-10 NOTE — TOC Progression Note (Signed)
Transition of Care [facility]) - Progression Note    Patient Details  Name: [name] MRN: [id number] Date of Birth: 09/17/1960  [facility] CM/SW Contact  Kemper Durie, RN Phone Number: 08/10/2022, 10:48 AM  Clinical Narrative:     Spoke with son Sharlet [place]ina, state patient's ID with new last name [name]) is with her at the bedside.  Will have copy made and left in floor chart to be scanned into electronic chart. Advised son that she  has 2 bed offers, [address] and [place]. He would like to know the copay/patient responsibility.  Advised this information would have to come from the facility and/or insurance.  He would like to see if [facility] will offer a bed and what the cost will be after the covered insurance days.  State the goal is to transition to LTC. TOC will continue to follow and follow up on interest for transition to LTC.  Expected Discharge Plan: Skilled Nursing Facility Barriers to Discharge: Continued Medical Work up  Expected Discharge Plan and Services                                               Social Determinants of Health (SDOH) Interventions SDOH Screenings   Food Insecurity: No Food Insecurity (08/07/2022)  Housing: Low Risk  (08/07/2022)  Transportation Needs: No Transportation Needs (08/07/2022)  Utilities: Not At Risk (08/07/2022)  Alcohol Screen: Low Risk  (01/17/2017)  Tobacco Use: Low Risk  (08/07/2022)    Readmission Risk Interventions     No data to display

## 2022-08-10 NOTE — Progress Notes (Signed)
BiPAP trial was done to see if patient was able to wear. Pt unable to breath well using bipap. BIPAP was taken off and 2L nasal cannula was placed back on. Sats 95% respiratory rate 18/min.

## 2022-08-11 DIAGNOSIS — R0603 Acute respiratory distress: Secondary | ICD-10-CM | POA: Diagnosis not present

## 2022-08-11 DIAGNOSIS — T17908A Unspecified foreign body in respiratory tract, part unspecified causing other injury, initial encounter: Secondary | ICD-10-CM | POA: Diagnosis not present

## 2022-08-11 DIAGNOSIS — G7100 Muscular dystrophy, unspecified: Secondary | ICD-10-CM | POA: Diagnosis not present

## 2022-08-11 LAB — CULTURE, BLOOD (ROUTINE X 2)
Culture: NO GROWTH
Special Requests: ADEQUATE

## 2022-08-11 NOTE — Progress Notes (Signed)
Patient NIF was -40cm

## 2022-08-11 NOTE — Progress Notes (Signed)
PROGRESS NOTE  [name]    DOB: March 02, 1960, 62 y.o.  WUJ:[id number]    Code Status: DNR   DOA: 08/06/2022   LOS: 3   Brief hospital course  [name] is a 62 y.o. female with a PMH significant for muscular dystrophy, hypothyroidism, depression, hypothyroidism, HLD, cardiac murmur.  They presented from home to the ED on 08/06/2022 with difficulty swallowing x several days. She was choking on food/drink and also had difficulty with swallowing her secretions. Denies prior difficulty with this.   In the ED, it was found that they had HR 111, temp 98.1, RR 30, BP 116/89, 96% O2 on 2L [place].  Significant findings included completely normal CBC, and unremarkable metabolic panel. Negative troponin, D-dimer and LA. Urinalysis positive for ketones.   They were initially treated with home medications, azithromycin and CTX.   Patient was admitted to medicine service for further workup and management of dysphagia as outlined in detail below.  6/20: SLP evaluation- general aspiration precautions and mechanical soft diet for energy preservation.  6/21: PT/OT evaluated and recommended SNF. TOC engaged for placement. NIF of -30. Increased levothyroxine 6/22: NIF -40, FVC 0.57. unable to tolerate bipap mask due to claustrophobia  6/23-6/24: NIF -35. Wants to try bipap again. Awaiting placement for SNF. Possible dc tomorrow. Clinically cleared for dc when placement available. Will need neurology and pulmonology follow up.   Assessment & Plan  Principal Problem:   Choking Active Problems:   SIRS (systemic inflammatory response syndrome) (HCC)   Bilateral leg edema   Hypothyroidism   MUSCULAR DYSTROPHY   Respiratory distress   Aspiration into airway   Muscular dystrophy (HCC)  Dysphagia- SLP evaluation- general aspiration precautions and mechanical soft diet for energy preservation.  - SLP evaluation- diet in per recommendations - IVF discontinued  Acute hypoxic respiratory failure   aspiration PNA- resolved. initially on 2L[place] and returned to room air. Likely related to aspiration.  Patient having oxygen desats when sleeping- apnea - discussed case with pulmonology, Dr. Meredeth Ide who is on call and they recommended further CAP treatment and follow up outpatient for PFT monitoring as disease progresses. Also consider NIV treatment for sleep apnea.  - discussed with patient and she would like to trial bipap again tonight. If unable to tolerate, oxygen when sleeping - monitor breathing - recommend pulmonology referral for PFT after dc.  - continue Abx - please document any O2 desaturations and need for oxygen support in her chart  Muscular dystrophy- with progressing disease, patient wants to know options for residential care - PT/OT- recommending SNF. Awaiting placement.  - SLP - TOC - follow up with neurology  Constipation- chronic. resolved s/p enema - continue miralax - suppositories PRN  Hypothyroidism - TSH was 15.62 on 5/20 and her dose of levothyroxine was increased. Likely contributing to worsening weakness. 5.783 this admission so has improved. Increase again slightly and recheck TSH in about 3-4 weeks.  - continue levothyroxine at increased dose- .  Body mass index is 27.51 kg/m.  VTE ppx: heparin injection 5,000 Units Start: 08/06/22 2230  Diet:     Diet   DIET DYS 3 Room service appropriate? Yes with Assist; Fluid consistency: Thin   Consultants: None   Subjective 08/11/22    Pt reports feeling well today. She again wasn't able to use bipap overnight but wants to continue to try it.    Objective   Vitals:   08/10/22 1717 08/10/22 1944 08/10/22 1946 08/11/22 0500  BP: 108/84  103/77  110/84  Pulse: (!) 105 (!) 111 (!) 110 (!) 109  Resp: 16 20  16   Temp: 98.5 F (36.9 C) 98.9 F (37.2 C)  98.4 F (36.9 C)  TempSrc: Oral Oral  Oral  SpO2: 100% 100% 100% 100%  Weight:    75 kg  Height:        Intake/Output Summary (Last 24 hours)  at 08/11/2022 0741 Last data filed at 08/11/2022 0500 Gross per 24 hour  Intake --  Output 525 ml  Net -525 ml    Filed Weights   08/07/22 0414 08/09/22 0443 08/11/22 0500  Weight: 77.8 kg 75.3 kg 75 kg    Physical Exam:  General: awake, alert, NAD HEENT: atraumatic, clear conjunctiva, anicteric sclera, MMM, hearing grossly normal Respiratory: normal respiratory effort. CTAB Cardiovascular: quick capillary refill, normal S1/S2, RRR, no JVD, murmurs Nervous: A&O x3. no gross focal neurologic deficits, normal speech Extremities: non-pitting edema diffusely, low muscle tone Skin: dry, intact, normal temperature, normal color. No rashes, lesions or ulcers on exposed skin Psychiatry: normal mood, congruent affect  Labs   I have personally reviewed the following labs and imaging studies CBC    Component Value Date/Time   WBC 6.0 08/10/2022 0429   RBC 4.20 08/10/2022 0429   HGB 13.0 08/10/2022 0429   HGB 10.3 (L) 02/19/2014 0421   HCT 42.4 08/10/2022 0429   HCT 31.0 (L) 02/19/2014 0421   PLT 260 08/10/2022 0429   PLT 251 02/19/2014 0421   MCV 101.0 (H) 08/10/2022 0429   MCV 94 02/19/2014 0421   MCH 31.0 08/10/2022 0429   MCHC 30.7 08/10/2022 0429   RDW 12.1 08/10/2022 0429   RDW 13.1 02/19/2014 0421   LYMPHSABS 1.5 08/06/2022 1518   LYMPHSABS 1.2 02/19/2014 0421   MONOABS 0.7 08/06/2022 1518   MONOABS 0.8 02/19/2014 0421   EOSABS 0.0 08/06/2022 1518   EOSABS 0.2 02/19/2014 0421   BASOSABS 0.0 08/06/2022 1518   BASOSABS 0.0 02/19/2014 0421      Latest Ref Rng & Units 08/10/2022    4:29 AM 08/07/2022    4:28 AM 08/06/2022    3:18 PM  BMP  Glucose 70 - 99 mg/dL 782  77  85   BUN 6 - 20 mg/dL 6  8  12    Creatinine 0.44 - 1.00 mg/dL <9.56  <2.13  <0.86   Sodium 135 - 145 mmol/L 132  138  136   Potassium 3.5 - 5.1 mmol/L 3.8  3.5  4.1   Chloride 98 - 111 mmol/L 92  100  95   CO2 22 - 32 mmol/L 33  28  29   Calcium 8.9 - 10.3 mg/dL 9.1  8.9  9.5    Disposition Plan &  Communication  Patient status: Inpatient  Admitted From: Home Planned disposition location: SNF Anticipated discharge date: 6/25 pending placement  Family Communication: none at bedside    Author: Leeroy Bock, DO [facility] 08/11/2022, 7:41 AM   Available by Epic secure chat 7AM-7PM. If 7PM-7AM, please contact night-coverage.  [facility] contact information found on [email or web address].

## 2022-08-11 NOTE — Progress Notes (Signed)
Physical Therapy Treatment Patient Details Name: [name] MRN: [id number] DOB: 1960/07/04 Today's Date: 08/11/2022   History of Present Illness Pt is a 62 y.o. female presenting to hospital 08/06/22 with c/o SOB (pt felt like she was aspirating).  Pt admitted with choking, dysphagia, acute hypoxic respiratory failure, SIRS, B leg edema, and muscular dystrophy.  PMH includes muscular dystrophy, hypothyroidism, depression, Hashiomoto's disease, heart murmur, Vitamin D deficiency, R TKA.    PT Comments    Pt resting in bed upon PT arrival; agreeable to therapy; pt pleasant, motivated, and participatory.  During session pt mod to max assist x2 with bed mobility; CGA to min assist for sitting balance d/t posterior lean intermittently (2nd assist in front of pt for safety); and max assist x2 to laterally scoot to R along bed a few trials.  Will continue to focus on strengthening, balance, and progressive functional mobility during hospitalization.   Recommendations for follow up therapy are one component of a multi-disciplinary discharge planning process, led by the attending physician.  Recommendations may be updated based on patient status, additional functional criteria and insurance authorization.  Follow Up Recommendations  Can patient physically be transported by private vehicle: No    Assistance Recommended at Discharge Frequent or constant Supervision/Assistance  Patient can return home with the following Two people to help with walking and/or transfers;A lot of help with bathing/dressing/bathroom;Assistance with cooking/housework;Assistance with feeding;Direct supervision/assist for medications management;Assist for transportation;Help with stairs or ramp for entrance   Equipment Recommendations  Other (comment) (TBD at next facility)    Recommendations for Other Services       Precautions / Restrictions Precautions Precautions: Fall Precaution Comments:  Aspiration Restrictions Weight Bearing Restrictions: No     Mobility  Bed Mobility Overal bed mobility: Needs Assistance Bed Mobility: Supine to Sit, Sit to Supine     Supine to sit: Mod assist, Max assist, +2 for physical assistance, HOB elevated Sit to supine: Mod assist, Max assist, +2 for physical assistance, HOB elevated   General bed mobility comments: assist for trunk and B LE's; use of bed pad    Transfers Overall transfer level: Needs assistance Equipment used: None Transfers: Bed to chair/wheelchair/BSC            Lateral/Scoot Transfers: Max assist, +2 physical assistance General transfer comment: lateral scoot to the R along bed x3 trials (using bed sheet; vc's for technique; vc's to lean forward for transfer; requiring assist for balance)    Ambulation/Gait               General Gait Details: Deferred (pt reports being non-ambulatory baseline)   Stairs             Wheelchair Mobility    Modified Rankin (Stroke Patients Only)       Balance Overall balance assessment: Needs assistance Sitting-balance support: Feet supported, Bilateral upper extremity supported Sitting balance-Leahy Scale: Poor Sitting balance - Comments: CGA to min assist for sitting balance (2nd SBA in front of pt for safety); pt needing to lean back (pt holding onto bed with hands for balance) in order to lift her head up into more neutral position--otherwise pt with flexed neck posture) Postural control: Posterior lean, Other (comment) (anterior lean)                                  Cognition Arousal/Alertness: Awake/alert Behavior During Therapy: WFL for tasks assessed/performed, Anxious Overall Cognitive  Status: Within Functional Limits for tasks assessed                                          Exercises      General Comments        Pertinent Vitals/Pain Pain Assessment Pain Assessment: No/denies pain Pt's HR 110-111 bpm  at rest beginning/end of session; O2 sats WFL on room air.    Home Living                          Prior Function            PT Goals (current goals can now be found in the care plan section) Acute Rehab PT Goals Patient Stated Goal: to improve strength and functional mobility PT Goal Formulation: With patient Time For Goal Achievement: 08/22/22 Potential to Achieve Goals: Good Progress towards PT goals: Progressing toward goals    Frequency    Min 3X/week      PT Plan Current plan remains appropriate    Co-evaluation              AM-PAC PT "6 Clicks" Mobility   Outcome Measure  Help needed turning from your back to your side while in a flat bed without using bedrails?: A Lot Help needed moving from lying on your back to sitting on the side of a flat bed without using bedrails?: Total Help needed moving to and from a bed to a chair (including a wheelchair)?: Total Help needed standing up from a chair using your arms (e.g., wheelchair or bedside chair)?: Total Help needed to walk in hospital room?: Total Help needed climbing 3-5 steps with a railing? : Total 6 Click Score: 7    End of Session   Activity Tolerance: Patient tolerated treatment well Patient left: in bed;with call bell/phone within reach;with bed alarm set (all needs set up per pt instruction) Nurse Communication: Mobility status;Need for lift equipment;Precautions;Other (comment) (Recommending hoyer lift for transfers) PT Visit Diagnosis: Other abnormalities of gait and mobility (R26.89);Muscle weakness (generalized) (M62.81)     Time: 1534-1600 PT Time Calculation (min) (ACUTE ONLY): 26 min  Charges:  $Therapeutic Activity: 23-37 mins                     Hendricks Limes, PT 08/11/22, 5:16 PM

## 2022-08-11 NOTE — TOC Progression Note (Addendum)
Transition of Care [facility]) - Progression Note    Patient Details  Name: [name] MRN: [id number] Date of Birth: [birth year]  Transition of Care [facility]) CM/SW Contact  Kreg Shropshire, RN Phone Number: 08/11/2022, 10:08 AM  Clinical Narrative:     1011- Sent message to Almira Coaster and Tammy at [facility] due to facilities being pt first choice. Awaiting response. Called son and he stated that they are also interested in [facility] as a second option. Cm sent message to Kenney Houseman of [facility] to see if they have a bed for her.  1144-Family agreed to [facility]. Cm reached out to [facility] admissions coordinator and auth pending.   1409- Cm received a call from [facility] stating that pt Auth will be voided due to pt ending hospice services on 6/19 and hospice has coverage for member until 6/30. The hospice coverage Is extended until 08/07/22. [employer] rep stated that we can start auth again on 7/1 for pt to go to SNF. Pt is also working with Long Term Medicaid application with [employer]. Cm informed supervisor update and next steps for pt d/c needs.  Expected Discharge Plan: Skilled Nursing Facility Barriers to Discharge: Continued Medical Work up  Expected Discharge Plan and Services                                               Social Determinants of Health (SDOH) Interventions SDOH Screenings   Food Insecurity: No Food Insecurity (08/07/2022)  Housing: Low Risk  (08/07/2022)  Transportation Needs: No Transportation Needs (08/07/2022)  Utilities: Not At Risk (08/07/2022)  Alcohol Screen: Low Risk  (01/17/2017)  Tobacco Use: Low Risk  (08/07/2022)    Readmission Risk Interventions     No data to display

## 2022-08-11 NOTE — Progress Notes (Signed)
Pt very weak and only able to get a NIF of -20.

## 2022-08-11 NOTE — Care Management Important Message (Signed)
Important Message  Patient Details  Name: [name] MRN: [id number] Date of Birth: February 26, 1960   Medicare Important Message Given:  Yes  Initial consent given for Medicare IM by patient.  Copy of Medicare IM given to patient and family for reference, original to be scanned into chart.    Johnell Comings 08/11/2022, 11:06 AM

## 2022-08-11 NOTE — TOC Progression Note (Signed)
Transition of Care [facility]) - Progression Note    Patient Details  Name: [name] MRN: [id number] Date of Birth: [birth year]  Transition of Care [facility]) CM/SW Contact  Chapman Fitch, RN Phone Number: 08/11/2022, 3:21 PM  Clinical Narrative:     Per Kenney Houseman at [facility] they need documation of revocation of hospice services.  Melissa with [facility] to email this documentation to this RNCM.   Per Kenney Houseman they can admit patient as early as tomorrow with revocation documentation.  She confirms they do not need an LOG, and [facility] will start auth on 7/1   Expected Discharge Plan: Skilled Nursing Facility Barriers to Discharge: Continued Medical Work up  Expected Discharge Plan and Services                                               Social Determinants of Health (SDOH) Interventions SDOH Screenings   Food Insecurity: No Food Insecurity (08/07/2022)  Housing: Low Risk  (08/07/2022)  Transportation Needs: No Transportation Needs (08/07/2022)  Utilities: Not At Risk (08/07/2022)  Alcohol Screen: Low Risk  (01/17/2017)  Tobacco Use: Low Risk  (08/07/2022)    Readmission Risk Interventions     No data to display

## 2022-08-11 NOTE — Progress Notes (Addendum)
Nutrition Follow-up  DOCUMENTATION CODES:   Not applicable  INTERVENTION:   -D/c Ensure Enlive po BID, each supplement provides 350 kcal and 20 grams of protein.  -Boost Breeze po TID, each supplement provides 250 kcal and 9 grams of protein  -30 ml Prosource Plus BID, each supplement provides 100 kcals and 15 grams protein -MVI with minerals daily -D/c Magic Cup -Feeding assistance with meals  NUTRITION DIAGNOSIS:   Increased nutrient needs related to chronic illness as evidenced by estimated needs.  Ongoing  GOAL:   Patient will meet greater than or equal to 90% of their needs  Progressing   MONITOR:   PO intake, Supplement acceptance  REASON FOR ASSESSMENT:   Consult Poor PO  ASSESSMENT:   62 y.o. female with medical history significant for muscular dystrophy, hypothyroidism, depression and HLD who is admitted with dysphagia.  6/20- s/p BSE- dysphagia 3 diet with thin liquids   Reviewed I/O's: -525 ml x 24 hours and +147 ml since admission  UOP: 525 ml x 24 hours   Case discussed with RN, who reports pt is taking medications and tolerating diet well.   Spoke with pt at bedside, who was pleasant and in good spirits today. She complained of a headache (RN just administered tylenol). Pt reports that she is tolerating diet and does well with pureed foods, such as mashed potatoes. Pt reports it takes her a long time to chew meats and vegetables, due to fear to choking and "just getting used to eating again". Offered to downgrade diet further for ease of intake, however, pt politely declined. Noted meal completions 0-25%.   Pt feels more comfortable drinking than eating. Pt is drinking supplements, but prefers [employer]. SHe reports that the milky supplements as "too thick for her" and feel like they produce mucous. RD will adjust supplement regimen per pt preference.   Pt endorses wt loss, but unsure how much. She is concerned about muscle atrophy. Reviewed wt hx;  pt has experienced a 17.3% wt loss over the past 2 months, which is significant for time frame.   Discussed importance of good meal and supplement intake to promote healing.   Per TOC notes, pt awaiting SNF placement.   Medications reviewed.   Labs reviewed: Na: 132.    NUTRITION - FOCUSED PHYSICAL EXAM:  Flowsheet Row Most Recent Value  Orbital Region No depletion  Upper Arm Region Mild depletion  Thoracic and Lumbar Region No depletion  Buccal Region No depletion  Temple Region No depletion  Clavicle Bone Region No depletion  Clavicle and Acromion Bone Region No depletion  Scapular Bone Region No depletion  Dorsal Hand No depletion  Patellar Region No depletion  Anterior Thigh Region No depletion  Posterior Calf Region No depletion  Edema (RD Assessment) None  Hair Reviewed  Eyes Reviewed  Mouth Reviewed  Skin Reviewed  Nails Reviewed       Diet Order:   Diet Order             DIET DYS 3 Room service appropriate? Yes with Assist; Fluid consistency: Thin  Diet effective now                   EDUCATION NEEDS:   Education needs have been addressed  Skin:  Skin Assessment: Reviewed RN Assessment  Last BM:  08/08/22  Height:   Ht Readings from Last 1 Encounters:  08/06/22 5\' 5"  (1.651 m)    Weight:   Wt Readings from Last 1  Encounters:  08/11/22 75 kg    Ideal Body Weight:  56.8 kg  BMI:  Body mass index is 27.51 kg/m.  Estimated Nutritional Needs:   Kcal:  1700-1900  Protein:  85-100 grams  Fluid:  > 1.7 L    Levada Schilling, RD, LDN, CDCES Registered Dietitian II Certified Diabetes Care and Education Specialist Please refer to [facility] for RD and/or RD on-call/weekend/after hours pager

## 2022-08-12 DIAGNOSIS — G7100 Muscular dystrophy, unspecified: Secondary | ICD-10-CM | POA: Diagnosis not present

## 2022-08-12 DIAGNOSIS — R0603 Acute respiratory distress: Secondary | ICD-10-CM | POA: Diagnosis not present

## 2022-08-12 DIAGNOSIS — T17908A Unspecified foreign body in respiratory tract, part unspecified causing other injury, initial encounter: Secondary | ICD-10-CM | POA: Diagnosis not present

## 2022-08-12 LAB — BLOOD GAS, VENOUS
Bicarbonate: 32 mmol/L — ABNORMAL HIGH (ref 20.0–28.0)
O2 Saturation: 59.7 %
pCO2, Ven: 58 mmHg (ref 44–60)
pO2, Ven: 38 mmHg (ref 32–45)

## 2022-08-12 MED ORDER — POLYETHYLENE GLYCOL 3350 17 GM/SCOOP PO POWD: 17.0000 g | Freq: Every day | ORAL | 0 refills | Status: DC

## 2022-08-12 MED ORDER — GLYCERIN (LAXATIVE) 2 G RE SUPP: 1.0000 | Freq: Every day | RECTAL | 0 refills | Status: DC

## 2022-08-12 MED ORDER — PANTOPRAZOLE SODIUM 40 MG PO TBEC
40.0000 mg | DELAYED_RELEASE_TABLET | Freq: Two times a day (BID) | ORAL | Status: DC
  Administered 2022-08-12: 40 mg via ORAL
  Filled 2022-08-12: qty 1

## 2022-08-12 MED ORDER — SORBITOL 70 % SOLN
960.0000 mL | TOPICAL_OIL | Freq: Once | ORAL | Status: DC | PRN
  Filled 2022-08-12: qty 240

## 2022-08-12 MED ORDER — LEVOTHYROXINE SODIUM 125 MCG PO TABS: 125.0000 ug | ORAL_TABLET | Freq: Every day | ORAL | Status: DC

## 2022-08-12 NOTE — Progress Notes (Signed)
I have reviewed and concur with this student's documentation. CI was present during medication pass.   Leodis Sias, RN 08/12/2022 4:05 PM

## 2022-08-12 NOTE — Progress Notes (Signed)
Mobility Specialist - Progress Note   08/12/22 1200  Mobility  Activity Transferred from bed to chair  Level of Assistance Total care  Assistive Device Other (Comment)  Activity Response Tolerated well  $Mobility charge 1 Mobility     Pt lying in bed upon arrival, utilizing 2L. Pt agreeable to activity. MaxA to transfer EOB. Assist to support balance in seated position. TotalA to squat pivot bed-chair. Pillow wedge support provided. Pt left in recliner with alarm set, needs in reach.    Filiberto Pinks Mobility Specialist 08/12/22, 12:57 PM

## 2022-08-12 NOTE — Progress Notes (Signed)
Assisted patient with NIF procedure with a result of -38.

## 2022-08-12 NOTE — TOC Transition Note (Signed)
Transition of Care [facility]) - CM/SW Discharge Note   Patient Details  Name: [name] MRN: [id number] Date of Birth: 1960/12/05  [facility] Care [facility]) CM/SW Contact:  Kreg Shropshire, RN Phone Number: 08/12/2022, 1:40 PM   Clinical Narrative:    Patient will DC to: [facility]  Anticipated DC date: 08/12/22 Family notified: Rande Lawman Son Transport by: Non emergent EMS transport  Per MD patient ready for DC to . RN, patient, patient's family, and facility notified of DC. Discharge Summary sent to facility. RN given number for report. DC packet on chart. Ambulance transport requested for patient.  TOC signing off.    Final next level of care: Skilled Nursing Facility Barriers to Discharge: Barriers Resolved   Patient Goals and CMS Choice      Discharge Placement                Patient chooses bed at: [facility] Patient to be transferred to facility by: non emergent ems Name of family member notified: Rande Lawman Son Patient and family notified of of transfer: 08/12/22  Discharge Plan and Services Additional resources added to the After Visit Summary for                                       Social Determinants of Health (SDOH) Interventions SDOH Screenings   Food Insecurity: No Food Insecurity (08/07/2022)  Housing: Low Risk  (08/07/2022)  Transportation Needs: No Transportation Needs (08/07/2022)  Utilities: Not At Risk (08/07/2022)  Alcohol Screen: Low Risk  (01/17/2017)  Tobacco Use: Low Risk  (08/07/2022)     Readmission Risk Interventions     No data to display

## 2022-08-12 NOTE — Discharge Summary (Signed)
Physician Discharge Summary  Patient: [name] MWU:[id number] DOB: 04-19-1960   Code Status: DNR Admit date: 08/06/2022 Discharge date: 08/12/2022 Disposition: Skilled nursing facility, PT, OT, SLP, nurse aid, RN, and social worker PCP: Gardiner Coins, PA-C  Recommendations for Outpatient Follow-up:  Follow up with PCP within 1-2 weeks Regarding general hospital follow up and preventative care Recommend rechecking TSH in about 3-4 weeks. Levothyroxine increased to daily Follow up with neurology within 2 weeks Regarding disease management with muscular dystrophy Follow up with pulmonology Regarding pulmonary function testing. Fluctuating NIF/FVC throughout admission but most recent: Morning of discharge NIF -38, and evening prior to dc was -20.   Discharge Diagnoses:  Principal Problem:   Choking Active Problems:   SIRS (systemic inflammatory response syndrome) (HCC)   Bilateral leg edema   Hypothyroidism   MUSCULAR DYSTROPHY   Respiratory distress   Aspiration into airway   Muscular dystrophy [facility])  Brief Hospital Course Summary: [name] is a 62 y.o. female with a PMH significant for muscular dystrophy, hypothyroidism, depression, HLD, cardiac murmur.   They presented from home to the ED on 08/06/2022 with difficulty swallowing x several days. She was choking on food/drink and also had difficulty with swallowing her secretions. Denies prior difficulty with this.    In the ED, it was found that they had HR 111, temp 98.1, RR 30, BP 116/89, 96% O2 on 2L [place].  Significant findings included completely normal CBC, and unremarkable metabolic panel. Negative troponin, D-dimer and LA. Urinalysis positive for ketones.    They were initially treated with home medications, azithromycin and CTX.    Patient was admitted to medicine service for further workup and management of dysphagia and aspiration PNA as outlined in detail below.   6/20: SLP  evaluation- general aspiration precautions and mechanical soft diet for energy preservation.  6/21: PT/OT evaluated and recommended SNF. TOC engaged for placement. NIF of -30. Increased levothyroxine 6/22: NIF -40, FVC 0.57. unable to tolerate bipap mask due to claustrophobia  6/23-6/24: NIF -35. Wants to try bipap again. Awaiting placement for SNF. 6/25: unable to tolerate Bipap but wants to continue to trial nightly. Recommend continuing on 2L [place] PRN. Continue mechanically soft diet. Will need close follow up with neurology and pulmonology.  She completed her antibiotic course for PNA during admission. She has no other new treatments.   Discharge Condition: Stable, improved Recommended discharge diet:  mechanical soft  Consultations: Pulmonology   Procedures/Studies: NIF, FVC  Allergies as of 08/12/2022   No Active Allergies      Medication List     STOP taking these medications    EQ Stool Softener/Laxative 8.6-50 MG tablet Generic drug: senna-docusate   oxybutynin 15 MG 24 hr tablet Commonly known as: DITROPAN XL       TAKE these medications    ezetimibe 10 MG tablet Commonly known as: ZETIA Take 1 tablet by mouth daily.   furosemide 40 MG tablet Commonly known as: LASIX Take 40 mg by mouth daily.   Glycerin (Adult) 2 g Supp Place 1 suppository rectally daily.   levothyroxine 125 MCG tablet Commonly known as: SYNTHROID Take 1 tablet (125 mcg total) by mouth daily at 6 (six) AM. Start taking on: August 13, 2022 What changed:  medication strength how much to take when to take this Another medication with the same name was removed. Continue taking this medication, and follow the directions you see here.   polyethylene glycol powder 17 GM/SCOOP  powder Commonly known as: GLYCOLAX/MIRALAX Take 17 g by mouth daily.   venlafaxine XR 75 MG 24 hr capsule Commonly known as: EFFEXOR-XR Take 75 mg by mouth at bedtime.        Contact information for  after-discharge care     Destination     [facility] Preferred SNF .   Service: Skilled Nursing Contact information: [address] [place] [address] [phone]                    Subjective   Pt reports feeling well today. She would like to have suppository today to help with BM which she has chronically taken daily. She denies respiratory distress. Expresses desire to continue to attempt to use bipap nightly.   All questions and concerns were addressed at time of discharge.  Objective  Blood pressure 102/73, pulse 80, temperature 98 F (36.7 C), resp. rate 14, height 5\' 5"  (1.651 m), weight 76.8 kg, last menstrual period 04/23/2015, SpO2 100 %.   General: Pt is alert, awake, not in acute distress Cardiovascular: RRR, S1/S2 +, no rubs, no gallops Respiratory: CTA bilaterally, no wheezing, no rhonchi Abdominal: Soft, NT, ND, bowel sounds + Extremities: trace edema, no cyanosis  The results of significant diagnostics from this hospitalization (including imaging, microbiology, ancillary and laboratory) are listed below for reference.   Imaging studies: CT Angio Chest PE W/Cm &/Or Wo Cm  Result Date: 08/06/2022 CLINICAL DATA:  Shortness of breath EXAM: CT ANGIOGRAPHY CHEST WITH CONTRAST TECHNIQUE: Multidetector CT imaging of the chest was performed using the standard protocol during bolus administration of intravenous contrast. Multiplanar CT image reconstructions and MIPs were obtained to evaluate the vascular anatomy. RADIATION DOSE REDUCTION: This exam was performed according to the departmental dose-optimization program which includes automated exposure control, adjustment of the mA and/or kV according to patient size and/or use of iterative reconstruction technique. CONTRAST:  75mL OMNIPAQUE IOHEXOL 350 MG/ML SOLN COMPARISON:  Chest x-ray 08/06/2022 FINDINGS: Cardiovascular: Satisfactory opacification of the pulmonary arteries to the segmental  level. No evidence of pulmonary embolism. Mild aortic atherosclerosis. Aneurysmal dilatation of the ascending aorta measuring up to 4.3 cm. No dissection. Coronary vascular calcification. Normal cardiac size. No pericardial effusion Mediastinum/Nodes: No enlarged mediastinal, hilar, or axillary lymph nodes. Thyroid gland, trachea, and esophagus demonstrate no significant findings. Lungs/Pleura: Dependent atelectasis in the lower lobes. No consolidation, pleural effusion or pneumothorax Upper Abdomen: No acute finding Musculoskeletal: No acute osseous abnormality. Fatty atrophy of the paraspinal muscles. Review of the MIP images confirms the above findings. IMPRESSION: 1. Negative for acute pulmonary embolus. 2. Aortic atherosclerosis. Aneurysmal dilatation of ascending aorta up to 4.3 cm. Recommend annual imaging followup by CTA or MRA. This recommendation follows 2010 ACCF/AHA/AATS/ACR/ASA/SCA/SCAI/SIR/STS/SVM Guidelines for the Diagnosis and Management of Patients with Thoracic Aortic Disease. Circulation. 2010; 121: B147-W295. Aortic aneurysm NOS (ICD10-I71.9) Aortic Atherosclerosis (ICD10-I70.0). Aortic aneurysm NOS (ICD10-I71.9). Electronically Signed   By: Jasmine Pang M.D.   On: 08/06/2022 21:23   US Venous Img Lower Bilateral (DVT)  Result Date: 08/06/2022 CLINICAL DATA:  Bilateral lower extremity edema. EXAM: BILATERAL LOWER EXTREMITY VENOUS DOPPLER ULTRASOUND TECHNIQUE: Gray-scale sonography with graded compression, as well as color Doppler and duplex ultrasound were performed to evaluate the lower extremity deep venous systems from the level of the common femoral vein and including the common femoral, femoral, profunda femoral, popliteal and calf veins including the posterior tibial, peroneal and gastrocnemius veins when visible. The superficial great saphenous vein was also interrogated.  Spectral Doppler was utilized to evaluate flow at rest and with distal augmentation maneuvers in the common  femoral, femoral and popliteal veins. COMPARISON:  None Available. FINDINGS: RIGHT LOWER EXTREMITY Common Femoral Vein: No evidence of thrombus. Normal compressibility, respiratory phasicity and response to augmentation. Saphenofemoral Junction: No evidence of thrombus. Normal compressibility and flow on color Doppler imaging. Profunda Femoral Vein: No evidence of thrombus. Normal compressibility and flow on color Doppler imaging. Femoral Vein: No evidence of thrombus. Normal compressibility, respiratory phasicity and response to augmentation. Popliteal Vein: No evidence of thrombus. Normal compressibility, respiratory phasicity and response to augmentation. Calf Veins: No evidence of thrombus. Normal compressibility and flow on color Doppler imaging. Superficial Great Saphenous Vein: No evidence of thrombus. Normal compressibility. Venous Reflux:  None. Other Findings:  None. LEFT LOWER EXTREMITY Common Femoral Vein: No evidence of thrombus. Normal compressibility, respiratory phasicity and response to augmentation. Saphenofemoral Junction: No evidence of thrombus. Normal compressibility and flow on color Doppler imaging. Profunda Femoral Vein: No evidence of thrombus. Normal compressibility and flow on color Doppler imaging. Femoral Vein: No evidence of thrombus. Normal compressibility, respiratory phasicity and response to augmentation. Popliteal Vein: No evidence of thrombus. Normal compressibility, respiratory phasicity and response to augmentation. Calf Veins: No evidence of thrombus. Normal compressibility and flow on color Doppler imaging. Superficial Great Saphenous Vein: No evidence of thrombus. Normal compressibility. Venous Reflux:  None. Other Findings:  None. IMPRESSION: No evidence of deep venous thrombosis in either lower extremity. Electronically Signed   By: Aram Candela M.D.   On: 08/06/2022 20:40   DG Chest Port 1 View  Result Date: 08/06/2022 CLINICAL DATA:  Sepsis EXAM: PORTABLE CHEST  1 VIEW COMPARISON:  X-ray 07/05/2022 FINDINGS: No consolidation, pneumothorax or effusion. No edema. Normal cardiopericardial silhouette. Calcified aorta. Overlapping cardiac leads. Degenerative changes along the spine. IMPRESSION: No acute cardiopulmonary disease. Electronically Signed   By: Karen Kays M.D.   On: 08/06/2022 14:58    Labs: Basic Metabolic Panel: Recent Labs  Lab 08/06/22 1518 08/07/22 0428 08/10/22 0429  NA 136 138 132*  K 4.1 3.5 3.8  CL 95* 100 92*  CO2 29 28 33*  GLUCOSE 85 77 119*  BUN 12 8 6   CREATININE <0.30* <0.30* <0.30*  CALCIUM 9.5 8.9 9.1   CBC: Recent Labs  Lab 08/06/22 1518 08/07/22 0428 08/10/22 0429  WBC 8.8 9.9 6.0  NEUTROABS 6.5  --   --   HGB 13.8 12.4 13.0  HCT 42.6 39.6 42.4  MCV 97.3 99.5 101.0*  PLT 288 245 260   Microbiology: Results for orders placed or performed during the hospital encounter of 08/06/22  Blood Culture (routine x 2)     Status: None   Collection Time: 08/06/22  1:34 PM   Specimen: BLOOD  Result Value Ref Range Status   Specimen Description BLOOD BLOOD LEFT HAND  Final   Special Requests   Final    BOTTLES DRAWN AEROBIC AND ANAEROBIC Blood Culture adequate volume   Culture   Final    NO GROWTH 5 DAYS Performed at [facility], [address] [place], [place] [address]    Report Status 08/11/2022 FINAL  Final  Blood Culture (routine x 2)     Status: None   Collection Time: 08/06/22  1:39 PM   Specimen: BLOOD  Result Value Ref Range Status   Specimen Description BLOOD BLOOD LEFT FOREARM  Final   Special Requests   Final    BOTTLES DRAWN AEROBIC AND ANAEROBIC Blood  Culture adequate volume   Culture   Final    NO GROWTH 5 DAYS Performed at [facility], [address] [place] [place], [place] [address]    Report Status 08/11/2022 FINAL  Final   Time coordinating discharge: Over 30 minutes  Leeroy Bock, MD  [facility] 08/12/2022, 12:19 PM

## 2022-09-02 ENCOUNTER — Encounter: Payer: Self-pay | Admitting: Pulmonary Disease

## 2022-09-02 ENCOUNTER — Ambulatory Visit (INDEPENDENT_AMBULATORY_CARE_PROVIDER_SITE_OTHER): Payer: Medicare Other | Admitting: Pulmonary Disease

## 2022-09-02 VITALS — BP 90/60 | HR 122 | Temp 97.8°F | Ht 65.0 in | Wt 155.0 lb

## 2022-09-02 DIAGNOSIS — G7102 Facioscapulohumeral muscular dystrophy: Secondary | ICD-10-CM

## 2022-09-02 DIAGNOSIS — J9811 Atelectasis: Secondary | ICD-10-CM

## 2022-09-02 DIAGNOSIS — J984 Other disorders of lung: Secondary | ICD-10-CM | POA: Diagnosis not present

## 2022-09-02 DIAGNOSIS — I428 Other cardiomyopathies: Secondary | ICD-10-CM

## 2022-09-02 DIAGNOSIS — R000 Tachycardia, unspecified: Secondary | ICD-10-CM

## 2022-09-02 DIAGNOSIS — R0600 Dyspnea, unspecified: Secondary | ICD-10-CM

## 2022-09-02 DIAGNOSIS — R1319 Other dysphagia: Secondary | ICD-10-CM

## 2022-09-02 DIAGNOSIS — J9611 Chronic respiratory failure with hypoxia: Secondary | ICD-10-CM | POA: Diagnosis not present

## 2022-09-02 DIAGNOSIS — G7100 Muscular dystrophy, unspecified: Secondary | ICD-10-CM

## 2022-09-02 NOTE — Patient Instructions (Signed)
We discussed using a device called an incentive spirometer for working your respiratory muscles, your facility can provide you with 1 of these.  Is just a little simple device that can help you expand the bottom portion of your lungs.  For now I would continue oxygen.  We are going to get an echocardiogram to evaluate your heart structure.  I am going to have you be evaluated by one of our cardiologists with regards to your tachycardia (fast heart rate) this may be due to a weak heart or just what is called an appropriate sinus tachycardia but in any event may require some medication.  I will see you in follow-up in 6 to 8 weeks time please call sooner should any new problems arise.

## 2022-09-02 NOTE — Progress Notes (Unsigned)
Subjective:    Patient ID: [name], female    DOB: [birth year], 61 y.o.   MRN: [id number]  Patient Care Team: Deirdre Peer Remi Haggard as PCP - General (Physician Assistant)  Chief Complaint  Patient presents with   Consult    Has muscular Dystrophy FSHD.  Put on oxygen since rehab.  Problem got worse in March.  Labored in breathing.  Started yesterday again.      HPI [name] is a 62 year old lifelong never smoker with fasciocapulohumeral muscular dystrophy (FSHD) who presents for evaluation of ongoing need for oxygen.  The patient is currently residing in a skilled nursing facility.  Primary care provider is Mychal Emilio Aspen, PA.  The patient was recently admitted to [facility] after an episode of choking.  She developed respiratory distress and had an episode of aspiration into the airway.  She also had acute hypoxic respiratory failure.  The patient was admitted from 06 August 2022 through 12 August 2022.  During her course at [facility] she was tried on BiPAP several times but could not tolerate this.  She continues to be on oxygen at 2 L/min which she states is very comfortable for her and she does not wish to stop.  She feels the oxygen is helping her.  She has significant muscle weakness associated with her muscular dystrophy.  She is currently not using incentive spirometry.  Her CT scan performed during admission showed significant atelectasis in the lower lobes likely due to poor respiratory effort from her muscle weakness.  She is currently undergoing PT OT and feels that this is helping her.  She is intending to remain in skilled nursing facility as she requires assistance with all forms of activities of daily living and that she feels that she can no longer live independently.  She has not had any recent fevers, chills or sweats no cough or sputum production no hemoptysis.  Review of her records show that she has a persistent tachycardia.  She also has issues with  hypothyroidism and recently had her Synthroid adjusted.  She did have issues with lower extremity edema during her admission and required Lasix.  She continues to take Lasix.  She does not endorse any other symptomatology today.   Review of Systems A 10 point review of systems was performed and it is as noted above otherwise negative.   Past Medical History:  Diagnosis Date   Depression    FSHD (facioscapulohumeral muscular dystrophy) (HCC)    Hashimoto's disease    Heart murmur    Hyperthyroidism    Muscular dystrophy (HCC)    Thyroid disease    Urinary incontinence    Vitamin D deficiency     Past Surgical History:  Procedure Laterality Date   CESAREAN SECTION     TOTAL KNEE ARTHROPLASTY Right    TUBAL LIGATION  1996   Bilateral    Patient Active Problem List   Diagnosis Date Noted   Muscular dystrophy (HCC) 08/08/2022   Respiratory distress 08/07/2022   Aspiration into airway 08/07/2022   Choking 08/06/2022   Bilateral leg edema 08/06/2022   SIRS (systemic inflammatory response syndrome) (HCC) 08/06/2022   Anxiety state 07/23/2016   Severe recurrent major depression without psychotic features (HCC) 04/16/2016   Hypothyroidism 04/16/2016   OVERFLOW INCONTINENCE 02/21/2009   HYPERCHOLESTEROLEMIA 01/07/2008   MUSCULAR DYSTROPHY 01/07/2008   CARDIAC MURMUR, BENIGN 01/07/2008    Family History  Problem Relation Age of Onset   Muscular dystrophy Mother  Stroke Mother    Cancer Mother        uterine    Hyperlipidemia Father    Muscular dystrophy Sister    Heart failure Sister    Aneurysm Brother    Breast cancer Neg Hx    Colon cancer Neg Hx    Ovarian cancer Neg Hx     Social History   Tobacco Use   Smoking status: Never   Smokeless tobacco: Never  Substance Use Topics   Alcohol use: Not Currently    Comment: social    Allergies  Allergen Reactions   Lorazepam Other (See Comments)    Hallucinations and sob.    Current Meds  Medication Sig    levothyroxine (SYNTHROID) 125 MCG tablet Take 1 tablet (125 mcg total) by mouth daily at 6 (six) AM.   polyethylene glycol powder (GLYCOLAX/MIRALAX) 17 GM/SCOOP powder Take 17 g by mouth daily.   venlafaxine XR (EFFEXOR-XR) 75 MG 24 hr capsule Take 75 mg by mouth at bedtime.    Immunization History  Administered Date(s) Administered   Td 02/17/2001   Tdap 10/31/2011, 11/07/2014        Objective:     BP 90/60 (BP Location: Left Arm, Patient Position: Sitting, Cuff Size: Normal)   Pulse (!) 122   Temp 97.8 F (36.6 C) (Oral)   Ht 5\' 5"  (1.651 m)   Wt 155 lb (70.3 kg)   LMP 04/23/2015 (Exact Date)   SpO2 96%   BMI 25.79 kg/m   SpO2: 96 % O2 Device: Nasal cannula O2 Flow Rate (L/min): 2 L/min O2 Type: Continuous O2  GENERAL: Overweight woman, presents in transport chair due to muscle weakness.  Comfortable on nasal cannula O2 at 2 L/min. HEAD: Normocephalic, atraumatic.  EYES: Pupils equal, round, reactive to light.  No scleral icterus.  MOUTH: Dentition intact, oral mucosa moist.  No thrush  NECK: Supple. No thyromegaly. Trachea midline. No JVD.  No adenopathy. PULMONARY: Good air entry bilaterally.  No adventitious sounds. CARDIOVASCULAR: S1 and S2.  Tachycardic, no murmurs noted.   ABDOMEN: Benign. MUSCULOSKELETAL: No joint deformity, no clubbing, no edema.  NEUROLOGIC: Diffuse muscle weakness particularly upper extremities, perioral SKIN: Intact,warm,dry. PSYCH: Flat affect.  Assessment & Plan:     ICD-10-CM   1. 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 Chronic respiratory failure with hypoxia (HCC)  J96.11    Likely due to hypoventilation from muscle weakness Continue supplemental oxygen Given choking episodes BiPAP/CPAP not recommended    3. Atelectasis of both lungs  J98.11    Incentive spirometry as tolerated Due to hypoventilation    4. FSHD  (facioscapulohumeral muscular dystrophy) (HCC)  G71.02    Supportive care To be for seen by neurology    5. Tachycardia  R00.0 Ambulatory referral to Cardiology   Query inappropriate sinus tachycardia Query cardiomyopathy Echocardiogram    6. Dysphagia causing pulmonary aspiration with swallowing  R13.19    Due to oropharyngeal muscle weakness High risk for aspiration BiPAP and CPAP CONTRAINDICATED in this situation    7. 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  I42.8    Obtain echocardiogram Query cardiomyopathy associated with muscular dystrophy      Orders Placed This Encounter  Procedures   Ambulatory referral to Cardiology    Referral Priority:   Routine    Referral Type:   Consultation    Referral Reason:   Specialty  Services Required    Number of Visits Requested:   1   ECHOCARDIOGRAM COMPLETE    Standing Status:   Future    Standing Expiration Date:   09/02/2023    Order Specific Question:   Where should this test be performed    Answer:   [facility]    Order Specific Question:   Perflutren DEFINITY (image enhancing agent) should be administered unless hypersensitivity or allergy exist    Answer:   Administer Perflutren    Order Specific Question:   Reason for exam-Echo    Answer:   Dyspnea  R06.00    The patient is not a candidate for BiPAP/CPAP due to her issues with choking.  This would make aspiration worse.  Continue oxygen for now.  If her respiratory function declines she may actually require mechanical ventilation and she will need to determine what her goals of care are in this regard.  For now I would recommend that she perform incentive spirometry as tolerated.  Continue PT/OT.  I agree that she needs supervised environment for assistance with activities of daily living etc.  She does have a baseline tachycardia.  Patients with muscular dystrophy have a higher incidence of cardiomyopathy.  Will obtain echocardiogram to rule out potential cardiomyopathy.  Referral  to cardiology.  We will see the patient in follow-up in 6 to 8 weeks time call sooner should any new problems arise.   Gailen Shelter, MD Advanced Bronchoscopy PCCM [facility]    *This note was dictated using voice recognition software/Dragon.  Despite best efforts to proofread, errors can occur which can change the meaning. Any transcriptional errors that result from this process are unintentional and may not be fully corrected at the time of dictation.

## 2022-09-06 ENCOUNTER — Encounter: Payer: Self-pay | Admitting: Pulmonary Disease

## 2022-09-19 ENCOUNTER — Ambulatory Visit
Admission: RE | Admit: 2022-09-19 | Discharge: 2022-09-19 | Disposition: A | Discharge status: Home / Self Care | Payer: No Typology Code available for payment source | Source: Ambulatory Visit | Attending: Pulmonary Disease | Admitting: Pulmonary Disease

## 2022-09-19 DIAGNOSIS — I422 Other hypertrophic cardiomyopathy: Secondary | ICD-10-CM | POA: Insufficient documentation

## 2022-09-19 DIAGNOSIS — I080 Rheumatic disorders of both mitral and aortic valves: Secondary | ICD-10-CM | POA: Insufficient documentation

## 2022-09-19 DIAGNOSIS — J984 Other disorders of lung: Secondary | ICD-10-CM | POA: Insufficient documentation

## 2022-09-19 DIAGNOSIS — G7100 Muscular dystrophy, unspecified: Secondary | ICD-10-CM | POA: Diagnosis present

## 2022-09-19 DIAGNOSIS — R0609 Other forms of dyspnea: Secondary | ICD-10-CM

## 2022-09-19 DIAGNOSIS — R0600 Dyspnea, unspecified: Secondary | ICD-10-CM | POA: Insufficient documentation

## 2022-09-19 LAB — ECHOCARDIOGRAM COMPLETE
AR max vel: 0.62 cm2
AV Area VTI: 0.74 cm2
AV Area mean vel: 0.58 cm2
AV Mean grad: 39.2 mmHg
AV Peak grad: 63.7 mmHg
Ao pk vel: 3.99 m/s
Area-P 1/2: 8.82 cm2
MV VTI: 1.64 cm2
S' Lateral: 2.2 cm

## 2022-09-19 NOTE — Progress Notes (Signed)
*  PRELIMINARY RESULTS* Echocardiogram 2D Echocardiogram has been performed.  Cristela Blue 09/19/2022, 11:42 AM

## 2022-10-21 ENCOUNTER — Encounter: Payer: Self-pay | Admitting: Pulmonary Disease

## 2022-10-21 ENCOUNTER — Ambulatory Visit: Payer: Medicare Other | Admitting: Pulmonary Disease

## 2022-10-21 VITALS — BP 108/64 | HR 124 | Temp 97.8°F | Ht 65.0 in | Wt 143.0 lb

## 2022-10-21 DIAGNOSIS — J984 Other disorders of lung: Secondary | ICD-10-CM

## 2022-10-21 DIAGNOSIS — G7102 Facioscapulohumeral muscular dystrophy: Secondary | ICD-10-CM | POA: Diagnosis not present

## 2022-10-21 DIAGNOSIS — J9611 Chronic respiratory failure with hypoxia: Secondary | ICD-10-CM

## 2022-10-21 DIAGNOSIS — I4711 Inappropriate sinus tachycardia, so stated: Secondary | ICD-10-CM | POA: Insufficient documentation

## 2022-10-21 DIAGNOSIS — G7100 Muscular dystrophy, unspecified: Secondary | ICD-10-CM

## 2022-10-21 DIAGNOSIS — I422 Other hypertrophic cardiomyopathy: Secondary | ICD-10-CM

## 2022-10-21 DIAGNOSIS — R000 Tachycardia, unspecified: Secondary | ICD-10-CM

## 2022-10-21 DIAGNOSIS — I350 Nonrheumatic aortic (valve) stenosis: Secondary | ICD-10-CM

## 2022-10-21 NOTE — Patient Instructions (Signed)
We will see her in follow-up in 4 to 6 months time call sooner should any new problems arise.

## 2022-10-21 NOTE — Progress Notes (Signed)
Subjective:    Patient ID: [name], female    DOB: 1961-01-10, 62 y.o.   MRN: [id number]  Patient Care Team: Deirdre Peer Remi Haggard as PCP - General (Physician Assistant)  Chief Complaint  Patient presents with   Follow-up    Occasional SOB. No wheezing or cough.   HPI [name] is a 62 year old lifelong never smoker with fasciocapulohumeral muscular dystrophy (FSHD) who presents for follow-up on chronic respiratory failure with hypoxia on the basis of restrictive physiology due to muscular dystrophy.  The patient is currently residing in a skilled nursing facility.  We initially evaluated the patient on 02 September 2022.  Recall that at that time the patient had recently admitted to [facility] after an episode of choking.  She developed respiratory distress and had an episode of aspiration into the airway.  She also had acute hypoxic respiratory failure.  The patient was admitted from 06 August 2022 through 12 August 2022.  During her course at [facility] she was tried on BiPAP several times but could not tolerate this due to claustrophobia.  She continues to be on oxygen at 2 L/min which she states is very comfortable for her she has been maintained on O2 since then.  She feels the oxygen is helping her.  She has significant muscle weakness associated with her muscular dystrophy.  She is currently compliant with incentive spirometry.  Her CT scan performed during admission showed significant atelectasis in the lower lobes likely due to poor respiratory effort from her muscle weakness.  She is currently undergoing PT OT and feels that this is helping her.  She is intending to remain in skilled nursing facility as she requires assistance with all forms of activities of daily living and that she feels that she can no longer live independently.  She has not had any recent fevers, chills or sweats no cough or sputum production no hemoptysis.    At her initial visit it was noted that she had  significant tachycardia which is a persistent finding for her.  We ordered a 2D echo for evaluation of potential cardiomyopathy as patients with muscular dystrophy are prone to this.  She has been noted to have severe asymmetric septal hypertrophy and severe aortic stenosis on this echo.  She is to have an upcoming appointment with cardiology for evaluation of these issues.  Review of her records show that she has a persistent tachycardia.  She also has issues with hypothyroidism and recently had her Synthroid adjusted.  She did have issues with lower extremity edema during her admission to [facility] and required Lasix. She has not had any further issues with lower extremity edema, she has had Lasix discontinued.  She does not endorse any other complaint today.  She is very insightful as to her overall guarded prognosis.  She has not had any further issues with dysphagia or with choking episodes.   DATA 09/19/2022 echocardiogram: LVEF 65 to 70%, there is severe asymmetric left ventricular hypertrophy of the septal segment, grade 2 DD, severe aortic stenosis with aortic valve gradient of 44 mmHg, peak gradient of 68 mmHg, AVA0.73 cm,vmax 41 m/s  Review of Systems A 10 point review of systems was performed and it is as noted above otherwise negative.   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) 10/21/2022   Chronic respiratory failure with hypoxia (HCC) 10/21/2022   FSHD (facioscapulohumeral  muscular dystrophy) (HCC) 10/21/2022   Inappropriate sinus tachycardia 10/21/2022   Muscular dystrophy (HCC) 08/08/2022   Respiratory distress 08/07/2022   Aspiration into airway 08/07/2022   Choking 08/06/2022   Bilateral leg edema 08/06/2022   SIRS (systemic inflammatory response syndrome) (HCC) 08/06/2022   Anxiety state 07/23/2016   Severe recurrent major depression without psychotic features (HCC)  04/16/2016   Hypothyroidism 04/16/2016   OVERFLOW INCONTINENCE 02/21/2009   HYPERCHOLESTEROLEMIA 01/07/2008   MUSCULAR DYSTROPHY 01/07/2008   CARDIAC MURMUR, BENIGN 01/07/2008    Social History   Tobacco Use   Smoking status: Never   Smokeless tobacco: Never  Substance Use Topics   Alcohol use: Not Currently    Comment: social    Allergies  Allergen Reactions   Lorazepam Other (See Comments)    Hallucinations and sob.    Current Meds  Medication Sig   acetaminophen (TYLENOL) 325 MG tablet Take 650 mg by mouth every 6 (six) hours as needed.   levothyroxine (SYNTHROID) 125 MCG tablet Take 1 tablet (125 mcg total) by mouth daily at 6 (six) AM.   venlafaxine XR (EFFEXOR-XR) 75 MG 24 hr capsule Take 75 mg by mouth at bedtime.    Immunization History  Administered Date(s) Administered   Td 02/17/2001   Tdap 10/31/2011, 11/07/2014        Objective:     BP 108/64 (BP Location: Right Arm, Cuff Size: Normal)   Pulse (!) 124   Temp 97.8 F (36.6 C)   Ht 5\' 5"  (1.651 m)   Wt 143 lb (64.9 kg) Comment: per patient. in a wheelchair  LMP 04/23/2015 (Exact Date)   SpO2 100%   BMI 23.80 kg/m   SpO2: 100 % O2 Device: Nasal cannula O2 Flow Rate (L/min): 2 L/min O2 Type: Continuous O2  GENERAL: Overweight woman, presents in transport chair due to muscle weakness.  Comfortable on nasal cannula O2 at 2 L/min. HEAD: Normocephalic, atraumatic.  EYES: Pupils equal, round, reactive to light.  No scleral icterus.  MOUTH: Dentition intact, oral mucosa moist.  No thrush  NECK: Supple. No thyromegaly. Trachea midline. No JVD.  No adenopathy. PULMONARY: Good air entry bilaterally.  No adventitious sounds. CARDIOVASCULAR: S1 and S2.  Tachycardic, grade 2/6 holosystolic murmur left sternal border.   ABDOMEN: Benign. MUSCULOSKELETAL: No joint deformity, no clubbing, no edema.  NEUROLOGIC: Diffuse muscle weakness particularly upper extremities, perioral SKIN: Intact,warm,dry. PSYCH:  Flat affect.      Assessment & Plan:     ICD-10-CM   1. 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 Chronic respiratory failure with hypoxia (HCC)  J96.11    Continue oxygen at 2 L/min Patient compliant Patient notes benefit of therapy    3. FSHD (facioscapulohumeral muscular dystrophy) (HCC)  G71.02    This issue adds complexity to her management Follows with neurology Ongoing PT OT recommended    4. Asymmetric septal hypertrophy (HCC)  I42.2    This issue adds complexity to her management Graded as severe by echo 2 August Has upcoming appointment with cardiology    5. Severe aortic stenosis  I35.0    Has upcoming appointment with cardiology Adds complexity to her management    6. Tachycardia  R00.0    To be evaluated by cardiology     Will see the patient in follow-up 4 to 6 months time call sooner should  any new problems arise.   Gailen Shelter, MD Advanced Bronchoscopy PCCM [facility]    *This note was dictated using voice recognition software/Dragon.  Despite best efforts to proofread, errors can occur which can change the meaning. Any transcriptional errors that result from this process are unintentional and may not be fully corrected at the time of dictation.

## 2022-11-10 NOTE — Progress Notes (Unsigned)
Cardiology Office Note  Date:  11/11/2022   ID:  Grey, Ignacio 08-13-1960, MRN [id number]  PCP:  Gardiner Coins, PA-C   Chief Complaint  Patient presents with   New Patient (Initial Visit)    Ref by Dr. Jayme Cloud for evaluation of Aortic valve stenosis & Tachycardia. Patient c/o shortness of breath & racing heart beats. Medications reviewed by the patient verbally.      HPI:  [name] is a 62 year old woman with past medical history of Aortic valve stenosis Tachycardia Chronic respiratory failure with hypoxia Muscular dystrophy/fasciocapulohumeral muscular dystrophy (FSHD)  On chronic oxygen 2 L Lives at skilled nursing home[facility] Who presents for evaluation of her aortic valve stenosis  In the hospital August 06, 2022 after difficulty swallowing, choking on food and drink and difficulty swallowing her secretions Treated for dysphagia, aspiration pneumonia Treated with BiPAP for respiratory support Placed on mechanical soft diet  On chronic oxygen, 2 liters  On discussion of echocardiogram as detailed below, She reports that she is not interested in surgical procedures More interested in quality of life rather than prolonging She is troubled by shortness of breath, activity is limited Presents in wheelchair, requires assistance for activities  Occasional palpitation Blood pressure runs low, denies near-syncope  Echo September 19, 2022 reviewed and detailed, images pulled up and discussed  1. Left ventricular ejection fraction, by estimation, is 65 to 70%. The  left ventricle has normal function. The left ventricle has no regional  wall motion abnormalities. There is severe asymmetric left ventricular  hypertrophy of the septal segment. Left   ventricular diastolic parameters are consistent with Grade II diastolic  dysfunction (pseudonormalization).   2. Right ventricular systolic function is normal. The right ventricular  size is normal.    3. The mitral valve is degenerative. No evidence of mitral valve  regurgitation. Severe mitral annular calcification.   4. Mean aortic valve gradient 44 mmHg, peak gradient , AVA 0.73cm2,  vmax 41.m/s.Marland Kitchen The aortic valve was not well visualized. Aortic valve  regurgitation is not visualized. Severe aortic valve stenosis.   EKG personally reviewed by myself on todays visit EKG Interpretation Date/Time:  Tuesday November 11 2022 10:54:43 EDT Ventricular Rate:  119 PR Interval:  138 QRS Duration:  92 QT Interval:  342 QTC Calculation: 481 R Axis:   -49  Text Interpretation: Sinus tachycardia Possible Left atrial enlargement Incomplete right bundle branch block Left anterior fascicular block Left ventricular hypertrophy with repolarization abnormality ( R in aVL , Cornell product ) Anteroseptal infarct , age undetermined When compared with ECG of 06-Aug-2022 19:18, No significant change was found Confirmed by Julien Nordmann [phone]) on 11/11/2022 11:21:14 AM    PMH:   has a past medical history of Depression, FSHD (facioscapulohumeral muscular dystrophy) (HCC), Hashimoto's disease, Heart murmur, Hyperthyroidism, Muscular dystrophy (HCC), Thyroid disease, Urinary incontinence, and Vitamin D deficiency.  PSH:    Past Surgical History:  Procedure Laterality Date   CESAREAN SECTION     TOTAL KNEE ARTHROPLASTY Right    TUBAL LIGATION  1996   Bilateral    Current Outpatient Medications  Medication Sig Dispense Refill   acetaminophen (TYLENOL) 325 MG tablet Take 650 mg by mouth every 6 (six) hours as needed.     levothyroxine (SYNTHROID) 125 MCG tablet Take 1 tablet (125 mcg total) by mouth daily at 6 (six) AM.     polyethylene glycol powder (GLYCOLAX/MIRALAX) 17 GM/SCOOP powder Take 17 g by mouth daily. 500 g 0  venlafaxine XR (EFFEXOR-XR) 75 MG 24 hr capsule Take 75 mg by mouth at bedtime.     Glycerin, Laxative, (GLYCERIN, ADULT,) 2 g SUPP Place 1 suppository rectally daily.  (Patient not taking: Reported on 09/02/2022)  0   No current facility-administered medications for this visit.    Allergies:   Lorazepam   Social History:  The patient  reports that she has never smoked. She has never used smokeless tobacco. She reports that she does not currently use alcohol. She reports that she does not use drugs.   Family History:   family history includes Aneurysm in her brother; Cancer in her mother; Coronary artery disease in her mother; Heart failure in her sister; Hyperlipidemia in her father; Muscular dystrophy in her mother and sister; Stroke in her mother.    Review of Systems: Review of Systems  Constitutional: Negative.   HENT: Negative.    Respiratory: Negative.    Cardiovascular: Negative.   Gastrointestinal: Negative.   Musculoskeletal: Negative.   Neurological: Negative.   Psychiatric/Behavioral: Negative.    All other systems reviewed and are negative.   PHYSICAL EXAM: VS:  BP 100/80 (BP Location: Right Arm, Patient Position: Sitting, Cuff Size: Normal)   Pulse (!) 119   Ht 5\' 5"  (1.651 m)   Wt 147 lb (66.7 kg)   LMP 04/23/2015 (Exact Date)   SpO2 98% Comment: 2 Liters of oxygen  BMI 24.46 kg/m  , BMI Body mass index is 24.46 kg/m. GEN: Well nourished, well developed, in no acute distress HEENT: normal Neck: no JVD, carotid bruits, or masses Cardiac: RRR; 3/6 systolic ejection murmur right sternal border No rubs, or gallops,no edema  Respiratory:  clear to auscultation bilaterally, normal work of breathing GI: soft, nontender, nondistended, + BS MS: no deformity or atrophy Skin: warm and dry, no rash Neuro:  Strength and sensation are intact Psych: euthymic mood, full affect  Recent Labs: 07/05/2022: Magnesium 2.3 08/06/2022: B Natriuretic Peptide 36.8 08/07/2022: ALT 19; TSH 5.783 08/10/2022: BUN 6; Creatinine, Ser <0.30; Hemoglobin 13.0; Platelets 260; Potassium 3.8; Sodium 132    Lipid Panel Lab Results  Component Value Date    CHOL 233 (H) 04/17/2016   HDL 43 04/17/2016   LDLCALC 163 (H) 04/17/2016   TRIG 135 04/17/2016      Wt Readings from Last 3 Encounters:  11/11/22 147 lb (66.7 kg)  10/21/22 143 lb (64.9 kg)  09/02/22 155 lb (70.3 kg)      ASSESSMENT AND PLAN:  Problem List Items Addressed This Visit       Cardiology Problems   Inappropriate sinus tachycardia   Relevant Orders   EKG 12-Lead (Completed)     Other   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)   Chronic respiratory failure with hypoxia (HCC)   FSHD (facioscapulohumeral muscular dystrophy) (HCC)   Muscular dystrophy (HCC)   Respiratory distress   Aspiration into airway   Other Visit Diagnoses     Aortic valve stenosis, etiology of cardiac valve disease unspecified    -  Primary   Relevant Orders   EKG 12-Lead (Completed)      Aortic valve stenosis, severe Noted on echocardiogram while in the hospital Markedly elevated mean gradient Echocardiogram images pulled up and reviewed, severe stenosis noted Discussed options for management including TAVR She is not particularly interested at this time We will provide information on the procedure If she changes her mind, we will arrange a consultation with valvular heart team in [place] -For worsening  lower extremity swelling she could take Lasix as needed  Sinus tachycardia Likely secondary to underlying muscular dystrophy Blood pressure low limiting use of beta-blockers She prefers not to be on medications Recommend she stay hydrated  Muscular dystrophy/FSHD Followed by clinic at [facility] She is interested in quality of life rather than prolonging with poor quality     Total encounter time more than 50 minutes  Greater than 50% was spent in counseling and coordination of care with the patient    Signed, Dossie Arbour, M.D., Ph.D. [facility] [place], [place] [phone]

## 2022-11-11 ENCOUNTER — Ambulatory Visit: Payer: Medicare Other | Attending: Cardiovascular Disease | Admitting: Cardiovascular Disease

## 2022-11-11 ENCOUNTER — Encounter: Payer: Self-pay | Admitting: Cardiovascular Disease

## 2022-11-11 VITALS — BP 100/80 | HR 119 | Ht 65.0 in | Wt 147.0 lb

## 2022-11-11 DIAGNOSIS — J9611 Chronic respiratory failure with hypoxia: Secondary | ICD-10-CM

## 2022-11-11 DIAGNOSIS — I4711 Inappropriate sinus tachycardia, so stated: Secondary | ICD-10-CM | POA: Diagnosis not present

## 2022-11-11 DIAGNOSIS — I350 Nonrheumatic aortic (valve) stenosis: Secondary | ICD-10-CM

## 2022-11-11 DIAGNOSIS — R0603 Acute respiratory distress: Secondary | ICD-10-CM

## 2022-11-11 DIAGNOSIS — T17908S Unspecified foreign body in respiratory tract, part unspecified causing other injury, sequela: Secondary | ICD-10-CM

## 2022-11-11 DIAGNOSIS — G7102 Facioscapulohumeral muscular dystrophy: Secondary | ICD-10-CM

## 2022-11-11 DIAGNOSIS — G7100 Muscular dystrophy, unspecified: Secondary | ICD-10-CM

## 2022-11-11 DIAGNOSIS — J984 Other disorders of lung: Secondary | ICD-10-CM | POA: Diagnosis not present

## 2022-11-11 NOTE — Patient Instructions (Addendum)
Medication Instructions:  No changes  If you need a refill on your cardiac medications before your next appointment, please call your pharmacy.   Lab work: No new labs needed  Testing/Procedures: No new testing needed  Follow-Up: At [facility], you and your health needs are our priority.  As part of our continuing mission to provide you with exceptional heart care, we have created designated Provider Care Teams.  These Care Teams include your primary Cardiologist (physician) and Advanced Practice Providers (APPs -  Physician Assistants and Nurse Practitioners) who all work together to provide you with the care you need, when you need it.  You will need a follow up appointment as needed  Providers on your designated Care Team:   Nicolasa Ducking, NP Eula Listen, PA-C Cadence Fransico Michael, [place]  COVID-19 Vaccine Information can be found at: [email or web address] For questions related to vaccine distribution or appointments, please email [email or web address] or call 8106127638.

## 2022-11-25 ENCOUNTER — Other Ambulatory Visit: Payer: Self-pay

## 2022-11-25 ENCOUNTER — Inpatient Hospital Stay
Admission: EM | Admit: 2022-11-25 | Discharge: 2022-12-19 | DRG: 951 | Disposition: E | Payer: Medicare Other | Attending: Obstetrics and Gynecology | Admitting: Obstetrics and Gynecology

## 2022-11-25 ENCOUNTER — Emergency Department: Payer: Medicare Other

## 2022-11-25 DIAGNOSIS — Z83438 Family history of other disorder of lipoprotein metabolism and other lipidemia: Secondary | ICD-10-CM | POA: Diagnosis not present

## 2022-11-25 DIAGNOSIS — Z515 Encounter for palliative care: Principal | ICD-10-CM

## 2022-11-25 DIAGNOSIS — F32A Depression, unspecified: Secondary | ICD-10-CM | POA: Diagnosis present

## 2022-11-25 DIAGNOSIS — G7100 Muscular dystrophy, unspecified: Secondary | ICD-10-CM | POA: Diagnosis not present

## 2022-11-25 DIAGNOSIS — Z96651 Presence of right artificial knee joint: Secondary | ICD-10-CM | POA: Diagnosis present

## 2022-11-25 DIAGNOSIS — Z1152 Encounter for screening for COVID-19: Secondary | ICD-10-CM | POA: Diagnosis not present

## 2022-11-25 DIAGNOSIS — J984 Other disorders of lung: Secondary | ICD-10-CM | POA: Diagnosis present

## 2022-11-25 DIAGNOSIS — Z66 Do not resuscitate: Secondary | ICD-10-CM | POA: Diagnosis present

## 2022-11-25 DIAGNOSIS — I350 Nonrheumatic aortic (valve) stenosis: Secondary | ICD-10-CM | POA: Diagnosis present

## 2022-11-25 DIAGNOSIS — Z79890 Hormone replacement therapy (postmenopausal): Secondary | ICD-10-CM | POA: Diagnosis not present

## 2022-11-25 DIAGNOSIS — Z820 Family history of epilepsy and other diseases of the nervous system: Secondary | ICD-10-CM

## 2022-11-25 DIAGNOSIS — G7102 Facioscapulohumeral muscular dystrophy: Secondary | ICD-10-CM | POA: Diagnosis present

## 2022-11-25 DIAGNOSIS — E559 Vitamin D deficiency, unspecified: Secondary | ICD-10-CM | POA: Diagnosis present

## 2022-11-25 DIAGNOSIS — Z8249 Family history of ischemic heart disease and other diseases of the circulatory system: Secondary | ICD-10-CM

## 2022-11-25 DIAGNOSIS — Z9981 Dependence on supplemental oxygen: Secondary | ICD-10-CM

## 2022-11-25 DIAGNOSIS — E063 Autoimmune thyroiditis: Secondary | ICD-10-CM | POA: Diagnosis present

## 2022-11-25 DIAGNOSIS — E8729 Other acidosis: Secondary | ICD-10-CM | POA: Diagnosis present

## 2022-11-25 DIAGNOSIS — J9621 Acute and chronic respiratory failure with hypoxia: Secondary | ICD-10-CM | POA: Diagnosis present

## 2022-11-25 DIAGNOSIS — E785 Hyperlipidemia, unspecified: Secondary | ICD-10-CM | POA: Diagnosis present

## 2022-11-25 DIAGNOSIS — Z79899 Other long term (current) drug therapy: Secondary | ICD-10-CM

## 2022-11-25 DIAGNOSIS — G9341 Metabolic encephalopathy: Secondary | ICD-10-CM | POA: Diagnosis present

## 2022-11-25 DIAGNOSIS — J9601 Acute respiratory failure with hypoxia: Principal | ICD-10-CM | POA: Diagnosis present

## 2022-11-25 DIAGNOSIS — J9611 Chronic respiratory failure with hypoxia: Secondary | ICD-10-CM | POA: Diagnosis present

## 2022-11-25 DIAGNOSIS — Z823 Family history of stroke: Secondary | ICD-10-CM | POA: Diagnosis not present

## 2022-11-25 DIAGNOSIS — J9612 Chronic respiratory failure with hypercapnia: Secondary | ICD-10-CM | POA: Diagnosis present

## 2022-11-25 DIAGNOSIS — J9622 Acute and chronic respiratory failure with hypercapnia: Secondary | ICD-10-CM | POA: Diagnosis present

## 2022-11-25 LAB — TSH: TSH: 0.016 u[IU]/mL — ABNORMAL LOW (ref 0.350–4.500)

## 2022-11-25 LAB — RESP PANEL BY RT-PCR (RSV, FLU A&B, COVID)  RVPGX2
Influenza A by PCR: NEGATIVE
Influenza B by PCR: NEGATIVE
Resp Syncytial Virus by PCR: NEGATIVE
SARS Coronavirus 2 by RT PCR: NEGATIVE

## 2022-11-25 LAB — LACTIC ACID, PLASMA: Lactic Acid, Venous: 1 mmol/L (ref 0.5–1.9)

## 2022-11-25 LAB — CBC WITH DIFFERENTIAL/PLATELET
Abs Immature Granulocytes: 0.05 10*3/uL (ref 0.00–0.07)
Basophils Absolute: 0 10*3/uL (ref 0.0–0.1)
Basophils Relative: 0 %
Eosinophils Absolute: 0 10*3/uL (ref 0.0–0.5)
Eosinophils Relative: 0 %
HCT: 43.9 % (ref 36.0–46.0)
Hemoglobin: 13.3 g/dL (ref 12.0–15.0)
Immature Granulocytes: 1 %
Lymphocytes Relative: 2 %
Lymphs Abs: 0.2 10*3/uL — ABNORMAL LOW (ref 0.7–4.0)
MCH: 31.1 pg (ref 26.0–34.0)
MCHC: 30.3 g/dL (ref 30.0–36.0)
MCV: 102.8 fL — ABNORMAL HIGH (ref 80.0–100.0)
Monocytes Absolute: 0.7 10*3/uL (ref 0.1–1.0)
Monocytes Relative: 8 %
Neutro Abs: 8.1 10*3/uL — ABNORMAL HIGH (ref 1.7–7.7)
Neutrophils Relative %: 89 %
Platelets: 229 10*3/uL (ref 150–400)
RBC: 4.27 MIL/uL (ref 3.87–5.11)
RDW: 11.9 % (ref 11.5–15.5)
WBC: 9.1 10*3/uL (ref 4.0–10.5)
nRBC: 0 % (ref 0.0–0.2)

## 2022-11-25 LAB — URINALYSIS, W/ REFLEX TO CULTURE (INFECTION SUSPECTED)
Bacteria, UA: NONE SEEN
Bilirubin Urine: NEGATIVE
Glucose, UA: 50 mg/dL — AB
Ketones, ur: 80 mg/dL — AB
Leukocytes,Ua: NEGATIVE
Nitrite: NEGATIVE
Protein, ur: 30 mg/dL — AB
Specific Gravity, Urine: 1.018 (ref 1.005–1.030)
pH: 5 (ref 5.0–8.0)

## 2022-11-25 LAB — COMPREHENSIVE METABOLIC PANEL
ALT: 16 U/L (ref 0–44)
AST: 17 U/L (ref 15–41)
Albumin: 4.2 g/dL (ref 3.5–5.0)
Alkaline Phosphatase: 59 U/L (ref 38–126)
Anion gap: 16 — ABNORMAL HIGH (ref 5–15)
BUN: 8 mg/dL (ref 6–20)
CO2: 43 mmol/L — ABNORMAL HIGH (ref 22–32)
Calcium: 10 mg/dL (ref 8.9–10.3)
Chloride: 79 mmol/L — ABNORMAL LOW (ref 98–111)
Creatinine, Ser: <0.3 mg/dL — ABNORMAL LOW (ref 0.44–1.00)
Glucose, Bld: 133 mg/dL — ABNORMAL HIGH (ref 70–99)
Potassium: 4.1 mmol/L (ref 3.5–5.1)
Sodium: 138 mmol/L (ref 135–145)
Total Bilirubin: 1.5 mg/dL — ABNORMAL HIGH (ref 0.3–1.2)
Total Protein: 7.4 g/dL (ref 6.5–8.1)

## 2022-11-25 LAB — BRAIN NATRIURETIC PEPTIDE: B Natriuretic Peptide: 145 pg/mL — ABNORMAL HIGH (ref 0.0–100.0)

## 2022-11-25 LAB — PROTIME-INR
INR: 1 (ref 0.8–1.2)
Prothrombin Time: 13.1 s (ref 11.4–15.2)

## 2022-11-25 MED ORDER — POLYVINYL ALCOHOL 1.4 % OP SOLN: 1.0000 [drp] | Freq: Four times a day (QID) | OPHTHALMIC | Status: DC | PRN

## 2022-11-25 MED ORDER — ACETAMINOPHEN 650 MG RE SUPP: 650.0000 mg | Freq: Four times a day (QID) | RECTAL | Status: DC | PRN

## 2022-11-25 MED ORDER — HALOPERIDOL 0.5 MG PO TABS: 0.5000 mg | ORAL_TABLET | ORAL | Status: DC | PRN

## 2022-11-25 MED ORDER — LACTATED RINGERS IV SOLN: INTRAVENOUS | Status: DC

## 2022-11-25 MED ORDER — BIOTENE DRY MOUTH MT LIQD: 15.0000 mL | OROMUCOSAL | Status: DC | PRN

## 2022-11-25 MED ORDER — ONDANSETRON HCL 4 MG/2ML IJ SOLN: 4.0000 mg | Freq: Four times a day (QID) | INTRAMUSCULAR | Status: DC | PRN

## 2022-11-25 MED ORDER — SODIUM CHLORIDE 0.9 % IV SOLN
500.0000 mg | INTRAVENOUS | Status: DC
  Administered 2022-11-25: 500 mg via INTRAVENOUS
  Filled 2022-11-25: qty 5

## 2022-11-25 MED ORDER — GLYCOPYRROLATE 0.2 MG/ML IJ SOLN: 0.2000 mg | INTRAMUSCULAR | Status: DC | PRN

## 2022-11-25 MED ORDER — IOHEXOL 350 MG/ML SOLN
75.0000 mL | Freq: Once | INTRAVENOUS | Status: AC | PRN
  Administered 2022-11-25: 75 mL via INTRAVENOUS

## 2022-11-25 MED ORDER — GLYCOPYRROLATE 0.2 MG/ML IJ SOLN
0.2000 mg | Freq: Once | INTRAMUSCULAR | Status: AC
  Administered 2022-11-25: 0.2 mg via INTRAVENOUS
  Filled 2022-11-25: qty 1

## 2022-11-25 MED ORDER — MORPHINE SULFATE (PF) 2 MG/ML IV SOLN
1.0000 mg | INTRAVENOUS | Status: DC | PRN
  Administered 2022-11-25: 1 mg via INTRAVENOUS
  Administered 2022-11-26: 2 mg via INTRAVENOUS
  Filled 2022-11-25 (×2): qty 1

## 2022-11-25 MED ORDER — MORPHINE SULFATE (PF) 2 MG/ML IV SOLN
2.0000 mg | Freq: Once | INTRAVENOUS | Status: AC
  Administered 2022-11-25: 2 mg via INTRAVENOUS
  Filled 2022-11-25: qty 1

## 2022-11-25 MED ORDER — HALOPERIDOL LACTATE 5 MG/ML IJ SOLN: 0.5000 mg | INTRAMUSCULAR | Status: DC | PRN

## 2022-11-25 MED ORDER — HALOPERIDOL LACTATE 2 MG/ML PO CONC: 0.5000 mg | ORAL | Status: DC | PRN

## 2022-11-25 MED ORDER — ACETAMINOPHEN 325 MG PO TABS: 650.0000 mg | ORAL_TABLET | Freq: Four times a day (QID) | ORAL | Status: DC | PRN

## 2022-11-25 MED ORDER — SODIUM CHLORIDE 0.9 % IV BOLUS
1000.0000 mL | Freq: Once | INTRAVENOUS | Status: AC
  Administered 2022-11-25: 1000 mL via INTRAVENOUS

## 2022-11-25 MED ORDER — GLYCOPYRROLATE 1 MG PO TABS: 1.0000 mg | ORAL_TABLET | ORAL | Status: DC | PRN

## 2022-11-25 MED ORDER — ONDANSETRON 4 MG PO TBDP: 4.0000 mg | ORAL_TABLET | Freq: Four times a day (QID) | ORAL | Status: DC | PRN

## 2022-11-25 MED ORDER — SODIUM CHLORIDE 0.9 % IV SOLN
2.0000 g | INTRAVENOUS | Status: DC
  Administered 2022-11-25: 2 g via INTRAVENOUS
  Filled 2022-11-25: qty 20

## 2022-11-25 NOTE — ED Notes (Signed)
Pt 75% on RA, placed on NRB

## 2022-11-25 NOTE — Assessment & Plan Note (Signed)
Chronic. 

## 2022-11-25 NOTE — ED Notes (Signed)
In and out performed with Herminio Commons and Irving Burton NT

## 2022-11-25 NOTE — Progress Notes (Signed)
     Referral received for [name] Chart reviewed. Dr Arnoldo Morale pages and I returned her call to discuss recommendations for symptom management.  Dr. Arnoldo Morale conveyed patient and family confirmed DNR and DNI. Patient also declining bipap at this time. Recommendations made for symptom management.   Plan is for patient to be admitted. PMT will continue to follow.   Only recommendations given. Initial consult not completed. Detailed note to follow once I have met with patient/family and GOC has been completed.   Thank you for your referral and allowing PMT to assist in [name]'s care.   Georgiann Cocker, FNP-BC Palliative Medicine Team   NO CHARGE

## 2022-11-25 NOTE — Assessment & Plan Note (Signed)
Patient with a known history of severe aortic stenosis Chart review following her recent appointment with cardiology, patient declined surgical procedure for valve replacement

## 2022-11-25 NOTE — Progress Notes (Signed)
CODE SEPSIS - PHARMACY COMMUNICATION  **Broad Spectrum Antibiotics should be administered within 1 hour of Sepsis diagnosis**  Time Code Sepsis Called/Page Received: 1536  Antibiotics Ordered: azithromycin and ceftriaxone  Time of 1st antibiotic administration: 1548  Additional action taken by pharmacy: None  If necessary, Name of Provider/Nurse Contacted: None    Rockwell Alexandria ,PharmD Clinical Pharmacist  12/06/2022  3:59 PM

## 2022-11-25 NOTE — Progress Notes (Signed)
Patient's CO2 is reading too high to crossover into Epic. RT gave results to Onnie Graham, RN.  PH- 7.25 CO2-139 PO2-42 HCO3-59.6

## 2022-11-25 NOTE — Assessment & Plan Note (Signed)
At baseline patient was 2 L of oxygen chronically She presented to the ER for evaluation of worsening shortness of breath and had room air pulse oximetry of 75% with increased work of breathing. ABG showed uncompensated respiratory acidosis with a pH of 7.25 and pCO2 of 136 She was placed briefly on a BiPAP which she was unable to tolerate She is currently on 2 L of oxygen for comfort

## 2022-11-25 NOTE — Assessment & Plan Note (Signed)
Discussed with patient's son over the phone, her father and friend at the bedside and the goal is to keep patient comfortable Orders for comfort measures have been placed.

## 2022-11-25 NOTE — ED Triage Notes (Signed)
Pt to ED [facility] for a couple days from [facility] for [facility]. Pt alert, not answering all orientation questions. Generalized edema noted

## 2022-11-25 NOTE — Progress Notes (Signed)
Patient is alert enough to tell this RT she does not want to be placed on Bipap to attempt to lower her CO2. MD and RN are aware.

## 2022-11-25 NOTE — ED Provider Notes (Addendum)
[facility] Provider Note    Event Date/Time   First MD Initiated Contact with Patient 11/23/2022 1517     (approximate)   History   Altered Mental Status   HPI  [name] is a 62 y.o. female past medical history significant for muscular dystrophy, aortic stenosis, hypothyroidism, depression, hyperlipidemia, who presents to the emergency department for shortness of breath.  Patient with progressively worsening shortness of breath which brought her into the emergency department.  When her friend arrived to the nursing facility today felt like she had increased work of breathing was not acting herself.  Patient states that she feels short of breath and weak.  States that she would not want resuscitated and has an active DNR form.  Also states that she would not want to be intubated and that her son Romeo Apple is her healthcare power of attorney.  Also requesting to call hospice and her father.     Physical Exam   Triage Vital Signs: ED Triage Vitals  Encounter Vitals Group     BP 12/05/2022 1520 (!) 136/101     Systolic BP Percentile --      Diastolic BP Percentile --      Pulse Rate 12/07/2022 1522 (!) 116     Resp 12/15/2022 1522 (!) 30     Temp 12/15/2022 1522 97.6 F (36.4 C)     Temp src --      SpO2 12/10/2022 1520 (!) 75 %     Weight 11/20/2022 1518 147 lb 4.8 oz (66.8 kg)     Height --      Head Circumference --      Peak Flow --      Pain Score --      Pain Loc --      Pain Education --      Exclude from Growth Chart --     Most recent vital signs: Vitals:   11/20/2022 1700 12/12/2022 1710  BP: (!) 135/93   Pulse: (!) 118 (!) 137  Resp: 12 13  Temp:    SpO2: 93% 93%    Physical Exam Constitutional:      General: She is in acute distress.     Appearance: She is well-developed. She is toxic-appearing.  HENT:     Head: Atraumatic.  Eyes:     Conjunctiva/sclera: Conjunctivae normal.  Cardiovascular:     Rate and Rhythm: Regular rhythm.  Tachycardia present.     Heart sounds: Murmur heard.  Pulmonary:     Effort: Respiratory distress present.     Breath sounds: No wheezing.     Comments: Hypoxic to 75% on room air, placed on 4 L nasal cannula and then down titrated to 2 L nasal cannula at 98%.  Tachypneic with shallow breathing. Abdominal:     General: There is no distension.     Tenderness: There is no abdominal tenderness.  Musculoskeletal:        General: Normal range of motion.     Cervical back: Normal range of motion.     Right lower leg: Edema present.     Left lower leg: Edema present.     Comments: Diffuse edema to bilateral upper and lower extremities.  No unilateral leg swelling.  Skin:    General: Skin is warm.     Capillary Refill: Capillary refill takes less than 2 seconds.  Neurological:     Mental Status: She is alert. Mental status is at baseline.  Psychiatric:  Comments: Anxious appearing     IMPRESSION / MDM / ASSESSMENT AND PLAN / ED COURSE  I reviewed the triage vital signs and the nursing notes.  Differential diagnosis including heart failure, progression of aortic stenosis, progression of muscular atrophy with poor respiratory drive, hypercarbia, pneumonia, pulmonary embolism  On chart review patient had recent hospitalization in June with similar episode.  Intermittently tolerated BiPAP but overall was initially placed on hospice until she improved and was able to go to a nursing facility.  EKG  I, Corena Herter, the attending physician, personally viewed and interpreted this ECG.  Sinus tachycardia with a rate of 117.  QRS 105.  QTc 105.  Signs of LVH with strain pattern.  No significant change when compared to prior EKG in June 2024  Sinus tachycardia while on cardiac telemetry.  RADIOLOGY  CTA chest -pending.  LABS (all labs ordered are listed, but only abnormal results are displayed) Labs interpreted as -    Labs Reviewed  COMPREHENSIVE METABOLIC PANEL - Abnormal;  Notable for the following components:      Result Value   Chloride 79 (*)    CO2 43 (*)    Glucose, Bld 133 (*)    Creatinine, Ser <0.30 (*)    Total Bilirubin 1.5 (*)    Anion gap 16 (*)    All other components within normal limits  URINALYSIS, W/ REFLEX TO CULTURE (INFECTION SUSPECTED) - Abnormal; Notable for the following components:   Color, Urine YELLOW (*)    APPearance HAZY (*)    Glucose, UA 50 (*)    Hgb urine dipstick SMALL (*)    Ketones, ur 80 (*)    Protein, ur 30 (*)    All other components within normal limits  BRAIN NATRIURETIC PEPTIDE - Abnormal; Notable for the following components:   B Natriuretic Peptide 145.0 (*)    All other components within normal limits  TSH - Abnormal; Notable for the following components:   TSH 0.016 (*)    All other components within normal limits  BLOOD GAS, VENOUS - Abnormal; Notable for the following components:   Bicarbonate 59.6 (*)    Acid-Base Excess 24.7 (*)    All other components within normal limits  CBC WITH DIFFERENTIAL/PLATELET - Abnormal; Notable for the following components:   MCV 102.8 (*)    Neutro Abs 8.1 (*)    Lymphs Abs 0.2 (*)    All other components within normal limits  RESP PANEL BY RT-PCR (RSV, FLU A&B, COVID)  RVPGX2  CULTURE, BLOOD (ROUTINE X 2)  CULTURE, BLOOD (ROUTINE X 2)  LACTIC ACID, PLASMA  PROTIME-INR  CBC WITH DIFFERENTIAL/PLATELET  T4, FREE  TROPONIN I (HIGH SENSITIVITY)     MDM  Patient arrived with tachycardia, hypothermia and acute hypoxia  Clinical picture concerning for sepsis on arrival.  Started on antibiotic to cover for community-acquired pneumonia.  Felt that 30 cc/kg of IV fluids may be detrimental to the patient given her signs of volume overload and anasarca, will give 1 L bolus and reevaluate.   Venous blood gas concerning for significant hypercarbia that does not compensated.  Discussed with respiratory therapy, very poor respiratory drive.  Concerned that the patient is  not going to tolerate BiPAP given her significant generalized weakness.  Patient confirmed DNR/DNI after discussion with the patient and her healthcare power of attorney, her son Prince Rome, over the telephone.  Offered a trial of BiPAP to the patient however patient did not want to attempt  and just wanted to be comfortable.  Patient requesting hospice consult, consulted and discussed with hospice team.  Plan to see the patient tomorrow.  Recommended glycopyrrolate 0.2 mg every 6 hours.  Morphine and Ativan as needed.  Hospice/palliative consult ordered.  Consult to hospitalist for further management of acute hypoxic hypercarbic respiratory failure.     PROCEDURES:  Critical Care performed: yes  .Critical Care  Performed by: Corena Herter, MD Authorized by: Corena Herter, MD   Critical care provider statement:    Critical care time (minutes):  60   Critical care time was exclusive of:  Separately billable procedures and treating other patients   Critical care was necessary to treat or prevent imminent or life-threatening deterioration of the following conditions:  Respiratory failure   Critical care was time spent personally by me on the following activities:  Development of treatment plan with patient or surrogate, discussions with consultants, evaluation of patient's response to treatment, examination of patient, ordering and review of laboratory studies, ordering and review of radiographic studies, ordering and performing treatments and interventions, pulse oximetry, re-evaluation of patient's condition and review of old charts   I assumed direction of critical care for this patient from another provider in my specialty: no     Care discussed with: admitting provider     Patient's presentation is most consistent with acute presentation with potential threat to life or bodily function.   MEDICATIONS ORDERED IN ED: Medications  cefTRIAXone (ROCEPHIN) 2 g in sodium chloride 0.9 % 100  mL IVPB (0 g Intravenous Stopped 12/04/2022 1621)  azithromycin (ZITHROMAX) 500 mg in sodium chloride 0.9 % 250 mL IVPB (500 mg Intravenous New Bag/Given 12/14/2022 1622)  iohexol (OMNIPAQUE) 350 MG/ML injection 75 mL (has no administration in time range)  sodium chloride 0.9 % bolus 1,000 mL (0 mLs Intravenous Stopped 11/22/2022 1711)  morphine (PF) 2 MG/ML injection 2 mg (2 mg Intravenous Given 11/21/2022 1655)  glycopyrrolate (ROBINUL) injection 0.2 mg (0.2 mg Intravenous Given 11/18/2022 1708)    FINAL CLINICAL IMPRESSION(S) / ED DIAGNOSES   Final diagnoses:  Acute respiratory failure with hypoxia and hypercapnia (HCC)     Rx / DC Orders   ED Discharge Orders     None        Note:  This document was prepared using Dragon voice recognition software and may include unintentional dictation errors.   Corena Herter, MD 12/06/2022 1719    Corena Herter, MD 12/17/2022 0865

## 2022-11-25 NOTE — H&P (Signed)
History and Physical    Patient: [name] WJX:[id number] DOB: [birth year] DOA: 12/11/22 DOS: the patient was seen and examined on 12/11/22 PCP: Gardiner Coins, PA-C  Patient coming from: Home  Chief Complaint:  Chief Complaint  Patient presents with   Altered Mental Status   Most of the history was obtained from her father and friend at the bedside.  Patient is not able to respond to any verbal stimuli. HPI: [name] is a 62 y.o. female with medical history significant for facioscapulohumeral muscular dystrophy, hypothyroidism, chronic respiratory failure on 2 L of oxygen, depression, restrictive lung disease, severe aortic stenosis who presents to the ER via EMS for evaluation of altered mental status. Patient's friend states that she had seen her earlier in the day and at that time she was minimally responsive and will nod her head yes or no to questions. Upon arrival to the ER she had room air pulse oximetry of 75% and was placed on a nonrebreather mask. Patient was able to speak to the ER physician and requested to be a DO NOT RESUSCITATE and also let her know that her son [name] is her designated healthcare power of attorney. At the time of my evaluation she is unresponsive to verbal stimuli. Per ER physician arterial blood gas showed uncompensated respiratory acidosis with a pH of 7.25 and pCO2 of 136.  Patient was unable to tolerate BiPAP and is currently on 2 L of oxygen and appears comfortable. Discussed with patient's father, Mr [name], her friend, Ms [name] as well as her son, [name] over the phone and the plan is to keep patient comfortable.     Review of Systems: unable to review all systems due to the inability of the patient to answer questions. Past Medical History:  Diagnosis Date   Depression    FSHD (facioscapulohumeral muscular dystrophy) (HCC)    Hashimoto's disease    Heart murmur    Hyperthyroidism    Muscular dystrophy (HCC)     Thyroid disease    Urinary incontinence    Vitamin D deficiency    Past Surgical History:  Procedure Laterality Date   CESAREAN SECTION     TOTAL KNEE ARTHROPLASTY Right    TUBAL LIGATION  1996   Bilateral   Social History:  reports that she has never smoked. She has never used smokeless tobacco. She reports that she does not currently use alcohol. She reports that she does not use drugs.  Allergies  Allergen Reactions   Lorazepam Other (See Comments)    Hallucinations and sob.    Family History  Problem Relation Age of Onset   Muscular dystrophy Mother    Stroke Mother    Cancer Mother        uterine    Coronary artery disease Mother    Hyperlipidemia Father    Muscular dystrophy Sister    Heart failure Sister    Aneurysm Brother    Breast cancer Neg Hx    Colon cancer Neg Hx    Ovarian cancer Neg Hx     Prior to Admission medications   Medication Sig Start Date End Date Taking? Authorizing Provider  acetaminophen (TYLENOL) 325 MG tablet Take 650 mg by mouth every 6 (six) hours as needed.    [provider]  Glycerin, Laxative, (GLYCERIN, ADULT,) 2 g SUPP Place 1 suppository rectally daily. Patient not taking: Reported on 09/02/2022 08/12/22   Leeroy Bock, MD  levothyroxine (SYNTHROID) 125 MCG tablet  Take 1 tablet (125 mcg total) by mouth daily at 6 (six) AM. 08/13/22   Leeroy Bock, MD  polyethylene glycol powder (GLYCOLAX/MIRALAX) 17 GM/SCOOP powder Take 17 g by mouth daily. 08/12/22   Leeroy Bock, MD  venlafaxine XR (EFFEXOR-XR) 75 MG 24 hr capsule Take 75 mg by mouth at bedtime. 06/28/19   [provider]  traZODone (DESYREL) 50 MG tablet Take 1 tablet (50 mg total) by mouth at bedtime. 04/18/16 06/16/19  Jimmy Footman, MD    Physical Exam: Vitals:   12/14/2022 1615 11/27/2022 1700 11/24/2022 1710 11/18/2022 1730  BP:  (!) 135/93  (!) 154/107  Pulse: (!) 115 (!) 118 (!) 137 (!) 141  Resp: 19 12 13 10   Temp:       TempSrc:      SpO2: 98% 93% 93% 98%  Weight:       Physical Exam Vitals and nursing note reviewed.  Constitutional:      Comments: Unresponsive to verbal stimuli. Guppy breathing  HENT:     Nose: Nose normal.     Comments: On nasal canula Eyes:     Comments: Pale conjunctiva  Cardiovascular:     Rate and Rhythm: Tachycardia present.     Heart sounds: Murmur heard.  Pulmonary:     Comments: Guppy breathing Abdominal:     General: Abdomen is flat. Bowel sounds are normal.     Palpations: Abdomen is soft.  Musculoskeletal:     Cervical back: Normal range of motion and neck supple.     Right lower leg: Edema present.     Left lower leg: Edema present.  Skin:    General: Skin is warm and dry.  Neurological:     Comments: Unable to assess  Psychiatric:     Comments: Unable to assess     Data Reviewed: Relevant notes from primary care and specialist visits, past discharge summaries as available in EHR, including Care Everywhere. Prior diagnostic testing as pertinent to current admission diagnoses Updated medications and problem lists for reconciliation ED course, including vitals, labs, imaging, treatment and response to treatment Triage notes, nursing and pharmacy notes and ED provider's notes Notable results as noted in HPI Labs reviewed.  Sodium 138, potassium 4.1, chloride 79, bicarb 43, glucose 133, BUN 8, creatinine 0.30, calcium 10.0, total protein 7.4, albumin 4.2, AST 17, ALT 16, alkaline phosphatase 59, TSH 0.016, BNP 145, white count 9.1, hemoglobin 13.3, hematocrit 43.9, platelet count 229 There are no new results to review at this time.  Assessment and Plan: * Acute metabolic encephalopathy Secondary to uncompensated respiratory acidosis Patient noted to have significant hypercapnia with pCO2 of 136 secondary to restrictive lung disease from her known muscular dystrophy Unable to tolerate BiPAP Patient is a DO NOT RESUSCITATE Currently on comfort  measures Continue oxygen supplementation for comfort  Acute hypoxic on chronic hypercapnic respiratory failure (HCC) At baseline patient was 2 L of oxygen chronically She presented to the ER for evaluation of worsening shortness of breath and had room air pulse oximetry of 75% with increased work of breathing. 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      Advance Care Planning:   Code Status: Do not attempt resuscitation (DNR) - Comfort care   Consults: Palliative care  Family Communication: Discussed patient's condition with her son/HPOA, Ben over the phone and her father at the bedside.  All questions and concerns have been addressed.  Patient is currently on comfort measures.  Severity of Illness: The appropriate patient status for this patient is INPATIENT. Inpatient status is judged to be reasonable and necessary in order to provide the required intensity of service to ensure the patient's safety. The patient's presenting symptoms, physical exam findings, and initial radiographic and laboratory data in the context of their chronic comorbidities is felt to place them at high risk for further clinical deterioration. Furthermore, it is not anticipated that the patient will be medically stable for discharge from the hospital within 2 midnights of admission.   * I certify that at the  point of admission it is my clinical judgment that the patient will require inpatient hospital care spanning beyond 2 midnights from the point of admission due to high intensity of service, high risk for further deterioration and high frequency of surveillance required.*  Author: Lucile Shutters, MD 12/08/2022 6:39 PM  For on call review [email or web address].

## 2022-11-25 NOTE — Assessment & Plan Note (Addendum)
Secondary to uncompensated respiratory acidosis Patient noted to have significant hypercapnia with pCO2 of 136 secondary to restrictive lung disease from her known muscular dystrophy Unable to tolerate BiPAP Patient is a DO NOT RESUSCITATE Currently on comfort measures Continue oxygen supplementation for comfort

## 2022-11-25 NOTE — Sepsis Progress Note (Signed)
Elink monitoring for the code sepsis protocol.  

## 2022-11-25 NOTE — ED Notes (Signed)
[place] increased to 4L to maintain SpO2 >92%

## 2022-11-25 NOTE — Sepsis Progress Note (Signed)
Notified bedside nurse of need to draw blood cultures.  

## 2022-11-25 NOTE — ED Notes (Signed)
Dr Arnoldo Morale notified of critical pH 7.25; CO2 > 136; pO2 40 ;bicarb 59.6. orders to be placed as needed

## 2022-11-25 NOTE — ED Notes (Signed)
Pharmacy contacted for missing med

## 2022-11-26 DIAGNOSIS — G7100 Muscular dystrophy, unspecified: Secondary | ICD-10-CM

## 2022-11-26 DIAGNOSIS — J984 Other disorders of lung: Secondary | ICD-10-CM

## 2022-11-26 DIAGNOSIS — G9341 Metabolic encephalopathy: Secondary | ICD-10-CM | POA: Diagnosis not present

## 2022-11-26 DIAGNOSIS — Z515 Encounter for palliative care: Secondary | ICD-10-CM | POA: Diagnosis not present

## 2022-11-26 LAB — BLOOD GAS, VENOUS
Acid-Base Excess: 24.7 mmol/L — ABNORMAL HIGH (ref 0.0–2.0)
Bicarbonate: 59.6 mmol/L — ABNORMAL HIGH (ref 20.0–28.0)
O2 Saturation: 68.2 %
Patient temperature: 36.4
pH, Ven: 7.25 (ref 7.25–7.43)
pO2, Ven: 40 mm[Hg] (ref 32–45)

## 2022-11-30 LAB — CULTURE, BLOOD (ROUTINE X 2)
Culture: NO GROWTH
Culture: NO GROWTH
Special Requests: ADEQUATE

## 2022-12-19 NOTE — Progress Notes (Signed)
RN called to bedside by family. Upon entry to room, pt appeared pale with fixed stare. Charge RN auscultated lungs and heart for 1 minute. No respirations noted. Time of death 47. Son called to bedside. No funeral home identified at this time.

## 2022-12-19 NOTE — Death Summary Note (Signed)
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 female with medical history significant for facioscapulohumeral muscular dystrophy, hypothyroidism, chronic respiratory failure on 2 L of oxygen, depression, restrictive lung disease, severe aortic stenosis who presents to the ER via EMS for evaluation of altered mental status. Patient's friend states that she had seen her earlier in the day and at that time she was minimally responsive and will nod her head yes or no to questions. Upon arrival to the ER she had room air pulse oximetry of 75% and was placed on a nonrebreather mask. Patient was able to speak to the ER physician and requested to be a DO NOT RESUSCITATE and also let her know that her son Romeo Apple is her designated healthcare power of attorney. At the time of my evaluation she is unresponsive to verbal stimuli. Per ER physician arterial blood gas showed uncompensated respiratory acidosis with a pH of 7.25 and pCO2 of 136.  Patient was unable to tolerate BiPAP and is currently on 2 L of oxygen and appears comfortable. Discussed with patient's father, Mr Tiaria Biby, her friend, Ms Servando Salina as well as her son, Prince Rome over the phone and the plan is to keep patient comfortable. Patient deceased hospital day 1.   Assessment and Plan: * Acute  metabolic encephalopathy Secondary to uncompensated respiratory acidosis Patient noted to have significant hypercapnia with pCO2 of 136 secondary to restrictive lung disease from her known muscular dystrophy Unable to tolerate BiPAP  Acute hypoxic on chronic hypercapnic respiratory failure (HCC) At baseline patient was 2 L of oxygen chronically She presented to the ER for evaluation of worsening shortness of breath and had room air pulse oximetry of 75% with increased work of breathing. 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         Procedures: none  Consultations: none  The results of significant diagnostics from this hospitalization (including imaging, microbiology, ancillary and laboratory) are listed below for reference.   Significant Diagnostic Studies: CT Angio Chest Pulmonary Embolism (PE) W or WO Contrast  Result Date: Dec 16, 2022 CLINICAL DATA:  Altered mental status, edema, high prob PE suspected EXAM: CT ANGIOGRAPHY CHEST WITH CONTRAST TECHNIQUE: Multidetector CT imaging of the chest was performed using the standard protocol during bolus administration of intravenous contrast. 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 female with medical history significant for facioscapulohumeral muscular dystrophy, hypothyroidism, chronic respiratory failure on 2 L of oxygen, depression, restrictive lung disease, severe aortic stenosis who presents to the ER via EMS for evaluation of altered mental status. Patient's friend states that she had seen her earlier in the day and at that time she was minimally responsive and will nod her head yes or no to questions. Upon arrival to the ER she had room air pulse oximetry of 75% and was placed on a nonrebreather mask. Patient was able to speak to the ER physician and requested to be a DO NOT RESUSCITATE and also let her know that her son Romeo Apple is her designated healthcare power of attorney. At the time of my evaluation she is unresponsive to verbal stimuli. Per ER physician arterial blood gas showed uncompensated respiratory acidosis with a pH of 7.25 and pCO2 of 136.  Patient was unable to tolerate BiPAP and is currently on 2 L of oxygen and appears comfortable. Discussed with patient's father, Mr Tiaria Biby, her friend, Ms Servando Salina as well as her son, Prince Rome over the phone and the plan is to keep patient comfortable. Patient deceased hospital day 1.   Assessment and Plan: * Acute  metabolic encephalopathy Secondary to uncompensated respiratory acidosis Patient noted to have significant hypercapnia with pCO2 of 136 secondary to restrictive lung disease from her known muscular dystrophy Unable to tolerate BiPAP  Acute hypoxic on chronic hypercapnic respiratory failure (HCC) At baseline patient was 2 L of oxygen chronically She presented to the ER for evaluation of worsening shortness of breath and had room air pulse oximetry of 75% with increased work of breathing. 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         Procedures: none  Consultations: none  The results of significant diagnostics from this hospitalization (including imaging, microbiology, ancillary and laboratory) are listed below for reference.   Significant Diagnostic Studies: CT Angio Chest Pulmonary Embolism (PE) W or WO Contrast  Result Date: Dec 16, 2022 CLINICAL DATA:  Altered mental status, edema, high prob PE suspected EXAM: CT ANGIOGRAPHY CHEST WITH CONTRAST TECHNIQUE: Multidetector CT imaging of the chest was performed using the standard protocol during bolus administration of intravenous contrast. Multiplanar CT image reconstructions and MIPs were  influenza from Nasopharyngeal swab specimens and should not be used as a sole basis for treatment. Nasal washings and aspirates are unacceptable for Xpert Xpress SARS-CoV-2/FLU/RSV testing.  Fact Sheet for Patients: BloggerCourse.com  Fact Sheet for Healthcare Providers: SeriousBroker.it  This test is not yet approved or cleared by the [place] FDA and has been authorized for detection and/or diagnosis of SARS-CoV-2 by FDA under an Emergency Use Authorization (EUA). This EUA will remain in effect (meaning this test can be used) for the duration of the COVID-19 declaration under Section 564(b)(1) of the Act, 21 U.S.C. section 360bbb-3(b)(1), unless the authorization is terminated or revoked.     Resp Syncytial Virus by PCR NEGATIVE NEGATIVE Final    Comment: (NOTE) Fact Sheet for Patients: BloggerCourse.com  Fact Sheet for Healthcare Providers: SeriousBroker.it  This test is not yet approved or cleared by the [place] FDA and has been authorized for detection and/or diagnosis of SARS-CoV-2 by FDA under an Emergency Use Authorization (EUA). This EUA will remain in effect (meaning this test can be used) for the duration of the COVID-19 declaration under Section 564(b)(1) of the Act, 21 U.S.C. section 360bbb-3(b)(1), unless the authorization is terminated or revoked.  Performed at [facility], [address], [place], [place] [address]     Time spent: 35 minutes  Signed: Silvano Bilis, MD 12/09/2022

## 2022-12-19 NOTE — Consult Note (Signed)
Consultation Note Date: 12/04/2022 at 1115  Patient Name: [name]  DOB: [birth year]  MRN: [id number]  Age / Sex: 62 y.o., female  PCP: Gardiner Coins, PA-C Referring Physician: Kathrynn Running, MD  HPI/Patient Profile: 62 y.o. female  with past medical history of facioscapulohumeral muscular dystrophy, hypothyroidism, chronic respiratory failure on 2 L of oxygen, depression, restrictive lung disease, severe aortic stenosis admitted on 12/20/2022 with AMS.   After discussion with ER physician, patient declared herself a DNR and named her son Romeo Apple as her designated healthcare power of attorney.  Patient was admitted and comfort measures were initiated overnight.  PMT was consulted to support patient at end-of-life and help manage symptoms with comfort measures.  Clinical Assessment and Goals of Care: Extensive chart review completed prior to meeting patient including labs, vital signs, imaging, progress notes, orders, and available advanced directive documents from current and previous encounters. I then met with patient and her father Jonny Ruiz at bedside.  I was not able to discuss diagnosis prognosis, GOC, EOL wishes, or disposition as patient was actively dying as I entered the room.  Patient's eyes were fixed, pupils nonreactive, hyperextension of her neck, drooping of the nasolabial folds, long periods of apnea, no response to verbal or physical touch.  Patient appears comfortable, in no respiratory distress or terminal agitation.  I encouraged family present to share final moments with patient as her EOL was near.  I encouraged them to speak with family that was not in the room to come and be with her.  Therapeutic silence, emotional support, and active listening provided.  No adjustment to [facility] needed.  Anticipate in-hospital death imminently.  Primary Decision Maker NEXT OF  KIN  Physical Exam Vitals reviewed.  Constitutional:      General: She is not in acute distress.    Appearance: She is ill-appearing.  HENT:     Head: Normocephalic.  Eyes:     Comments: Fixed pupils  Cardiovascular:     Rate and Rhythm: Bradycardia present.  Pulmonary:     Comments: Apnea noted for 15 seconds Skin:    General: Skin is dry.     Coloration: Skin is pale.     Palliative Assessment/Data: 10%     Thank you for this consult. Palliative medicine will continue to follow and assist holistically.   Time Total: 75 minutes  Time spent includes: Detailed review of medical records (labs, imaging, vital signs), medically appropriate exam (mental status, respiratory, cardiac, skin), discussed with treatment team, counseling and educating patient, family and staff, documenting clinical information, medication management and coordination of care.  Signed by: Georgiann Cocker, DNP, FNP-BC Palliative Medicine   Please contact Palliative Medicine Team providers via [facility] for questions and concerns.

## 2022-12-19 DEATH — deceased
